# Patient Record
Sex: Female | Born: 1970
Health system: Southern US, Community
[De-identification: ages and names within clinical notes are randomized; demographics above are authoritative.]

## PROBLEM LIST (undated history)

## (undated) DIAGNOSIS — R002 Palpitations: Secondary | ICD-10-CM

## (undated) DIAGNOSIS — E041 Nontoxic single thyroid nodule: Secondary | ICD-10-CM

## (undated) DIAGNOSIS — I10 Essential (primary) hypertension: Secondary | ICD-10-CM

## (undated) DIAGNOSIS — I712 Thoracic aortic aneurysm, without rupture, unspecified: Secondary | ICD-10-CM

## (undated) DIAGNOSIS — Q2381 Bicuspid aortic valve: Secondary | ICD-10-CM

## (undated) DIAGNOSIS — C50919 Malignant neoplasm of unspecified site of unspecified female breast: Secondary | ICD-10-CM

## (undated) DIAGNOSIS — Z9889 Other specified postprocedural states: Secondary | ICD-10-CM

## (undated) DIAGNOSIS — F32A Depression, unspecified: Secondary | ICD-10-CM

## (undated) DIAGNOSIS — E669 Obesity, unspecified: Secondary | ICD-10-CM

## (undated) DIAGNOSIS — D45 Polycythemia vera: Secondary | ICD-10-CM

## (undated) DIAGNOSIS — F329 Major depressive disorder, single episode, unspecified: Secondary | ICD-10-CM

## (undated) DIAGNOSIS — Q231 Congenital insufficiency of aortic valve: Secondary | ICD-10-CM

## (undated) DIAGNOSIS — R112 Nausea with vomiting, unspecified: Secondary | ICD-10-CM

## (undated) DIAGNOSIS — M549 Dorsalgia, unspecified: Secondary | ICD-10-CM

## (undated) DIAGNOSIS — N281 Cyst of kidney, acquired: Secondary | ICD-10-CM

## (undated) DIAGNOSIS — M2550 Pain in unspecified joint: Secondary | ICD-10-CM

## (undated) DIAGNOSIS — F419 Anxiety disorder, unspecified: Secondary | ICD-10-CM

## (undated) HISTORY — DX: Polycythemia vera: D45

## (undated) HISTORY — DX: Cyst of kidney, acquired: N28.1

## (undated) HISTORY — DX: Obesity, unspecified: E66.9

## (undated) HISTORY — PX: MASTECTOMY: SHX3

## (undated) HISTORY — DX: Dorsalgia, unspecified: M54.9

## (undated) HISTORY — DX: Thoracic aortic aneurysm, without rupture: I71.2

## (undated) HISTORY — DX: Bicuspid aortic valve: Q23.81

## (undated) HISTORY — PX: HERNIA REPAIR: SHX51

## (undated) HISTORY — DX: Thoracic aortic aneurysm, without rupture, unspecified: I71.20

## (undated) HISTORY — DX: Pain in unspecified joint: M25.50

## (undated) HISTORY — DX: Anxiety disorder, unspecified: F41.9

## (undated) HISTORY — DX: Depression, unspecified: F32.A

## (undated) HISTORY — DX: Essential (primary) hypertension: I10

## (undated) HISTORY — DX: Palpitations: R00.2

## (undated) HISTORY — PX: CHOLECYSTECTOMY: SHX55

## (undated) HISTORY — PX: TUBAL LIGATION: SHX77

## (undated) HISTORY — DX: Nontoxic single thyroid nodule: E04.1

## (undated) HISTORY — DX: Major depressive disorder, single episode, unspecified: F32.9

## (undated) HISTORY — PX: PARTIAL NEPHRECTOMY: SHX414

## (undated) HISTORY — DX: Congenital insufficiency of aortic valve: Q23.1

## (undated) HISTORY — PX: TONSILLECTOMY: SUR1361

---

## 1999-09-24 ENCOUNTER — Inpatient Hospital Stay (HOSPITAL_COMMUNITY): Admission: AD | Admit: 1999-09-24 | Discharge: 1999-09-24 | Payer: Self-pay | Admitting: Obstetrics and Gynecology

## 1999-12-02 ENCOUNTER — Inpatient Hospital Stay (HOSPITAL_COMMUNITY): Admission: AD | Admit: 1999-12-02 | Discharge: 1999-12-04 | Payer: Self-pay | Admitting: Obstetrics and Gynecology

## 2001-06-06 ENCOUNTER — Other Ambulatory Visit: Admission: RE | Admit: 2001-06-06 | Discharge: 2001-06-06 | Payer: Self-pay | Admitting: *Deleted

## 2001-10-21 ENCOUNTER — Observation Stay (HOSPITAL_COMMUNITY): Admission: AD | Admit: 2001-10-21 | Discharge: 2001-10-22 | Payer: Self-pay | Admitting: Obstetrics and Gynecology

## 2001-10-22 ENCOUNTER — Encounter: Payer: Self-pay | Admitting: Obstetrics and Gynecology

## 2001-12-02 ENCOUNTER — Inpatient Hospital Stay (HOSPITAL_COMMUNITY): Admission: AD | Admit: 2001-12-02 | Discharge: 2001-12-04 | Payer: Self-pay | Admitting: Obstetrics and Gynecology

## 2001-12-02 ENCOUNTER — Encounter (INDEPENDENT_AMBULATORY_CARE_PROVIDER_SITE_OTHER): Payer: Self-pay | Admitting: Specialist

## 2002-01-16 ENCOUNTER — Other Ambulatory Visit: Admission: RE | Admit: 2002-01-16 | Discharge: 2002-01-16 | Payer: Self-pay | Admitting: Obstetrics and Gynecology

## 2004-03-13 ENCOUNTER — Other Ambulatory Visit: Admission: RE | Admit: 2004-03-13 | Discharge: 2004-03-13 | Payer: Self-pay | Admitting: Obstetrics and Gynecology

## 2005-06-09 ENCOUNTER — Other Ambulatory Visit: Admission: RE | Admit: 2005-06-09 | Discharge: 2005-06-09 | Payer: Self-pay | Admitting: Obstetrics and Gynecology

## 2006-07-21 ENCOUNTER — Ambulatory Visit (HOSPITAL_COMMUNITY): Admission: RE | Admit: 2006-07-21 | Discharge: 2006-07-21 | Payer: Self-pay | Admitting: Obstetrics and Gynecology

## 2010-01-17 ENCOUNTER — Ambulatory Visit: Payer: Self-pay | Admitting: Emergency Medicine

## 2010-01-17 DIAGNOSIS — J029 Acute pharyngitis, unspecified: Secondary | ICD-10-CM | POA: Insufficient documentation

## 2010-01-17 DIAGNOSIS — J309 Allergic rhinitis, unspecified: Secondary | ICD-10-CM | POA: Insufficient documentation

## 2010-01-17 LAB — CONVERTED CEMR LAB: Rapid Strep: NEGATIVE

## 2010-07-19 ENCOUNTER — Encounter: Payer: Self-pay | Admitting: Obstetrics and Gynecology

## 2010-07-28 NOTE — Assessment & Plan Note (Signed)
Summary: STREP?/TM   Vital Signs:  Patient Profile:   40 Years Old Female CC:      sore throat congestion x 3  days Height:     65 inches Weight:      183 pounds O2 Sat:      97 % O2 treatment:    Room Air Temp:     97.8 degrees F oral Pulse rate:   71 / minute Resp:     14 per minute BP sitting:   108 / 79  (right arm) Cuff size:   regular  Vitals Entered By: Betti Cruz RN (January 17, 2010 9:49 AM)                  Updated Prior Medication List: No Medications Current Allergies: No known allergies History of Present Illness Chief Complaint: sore throat congestion x 3  days History of Present Illness: Constant sore throat, congestion x3 days.  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  No fever, +cough, no exudates.  Sudafed helps. Not trying any other OTCs.  +sick contact last week.  She is planning an upcoming trip to the beach and doesn't want to be sick. +h/o allergic rhinitis  REVIEW OF SYSTEMS Constitutional Symptoms      Denies fever, chills, night sweats, weight loss, weight gain, and fatigue.  Eyes       Denies change in vision, eye pain, eye discharge, glasses, contact lenses, and eye surgery. Ear/Nose/Throat/Mouth       Complains of sinus problems and sore throat.      Denies hearing loss/aids, change in hearing, ear pain, ear discharge, dizziness, frequent runny nose, frequent nose bleeds, hoarseness, and tooth pain or bleeding.  Respiratory       Denies dry cough, productive cough, wheezing, shortness of breath, asthma, bronchitis, and emphysema/COPD.  Cardiovascular       Denies murmurs, chest pain, and tires easily with exhertion.    Gastrointestinal       Denies stomach pain, nausea/vomiting, diarrhea, constipation, blood in bowel movements, and indigestion. Genitourniary       Denies painful urination, kidney stones, and loss of urinary control. Neurological       Denies paralysis, seizures, and fainting/blackouts. Musculoskeletal       Denies muscle pain,  joint pain, joint stiffness, decreased range of motion, redness, swelling, muscle weakness, and gout.  Skin       Denies bruising, unusual mles/lumps or sores, and hair/skin or nail changes.  Psych       Denies mood changes, temper/anger issues, anxiety/stress, speech problems, depression, and sleep problems.  Past History:  Past Medical History: Unremarkable  Past Surgical History: Lumpectomy Bilateral mastectomies with tram flap reconstruction- 2003  Family History: Family History Lung cancer- mother  Social History: Occupation: Therapist, sports at [employer] Married Never Smoked Alcohol use-yes 1/week Drug use-no Smoking Status:  never Drug Use:  no Physical Exam General appearance: well developed, well nourished, no acute distress Ears: normal, no lesions or deformities Nasal: mucosa pink, nonedematous, no septal deviation, turbinates normal Oral/Pharynx: mild pharyngeal erythema without exudate, +clear PND, uvula midline without deviation Neck: tender ant cerv LAD Chest/Lungs: no rales, wheezes, or rhonchi bilateral, breath sounds equal without effort Heart: regular rate and  rhythm, no murmur Skin: no obvious rashes or lesions MSE: oriented to time, place, and person Assessment New Problems: ACUTE PHARYNGITIS (ICD-462) ALLERGIC RHINITIS (ICD-477.9)  Meets only 1/4 Centor Critieria (<10% chance of strep).  Rapid strep neg.  Patient Education: Patient  and/or caregiver instructed in the following: fluids, Ibuprofen prn.  Plan New Orders: Rapid Strep [97915] New Patient Level II [99202] Planning Comments:   Sudafed twice a day Add Claritin Nasal saline, increase hydration of clear fluids Cough drops and throat spray    The patient and/or caregiver has been counseled thoroughly with regard to medications prescribed including dosage, schedule, interactions, rationale for use, and possible side effects and they verbalize understanding.  Diagnoses and expected course of recovery  discussed and will return if not improved as expected or if the condition worsens. Patient and/or caregiver verbalized understanding.   Orders Added: 1)  Rapid Strep [04136] 2)  New Patient Level II [43837]  Laboratory Results  Date/Time Received: January 17, 2010 10:09 AM  Date/Time Reported: January 17, 2010 10:09 AM   Other Tests  Rapid Strep: negative  Kit Test Internal QC: Negative   (Normal Range: Negative)

## 2010-11-13 NOTE — H&P (Signed)
[facility]  Patient:    Melissa Atkinson, Melissa Atkinson                       MRN: [id number] Adm. Date:  [id number] Attending:  Irven Shelling                         History and Physical  HISTORY OF PRESENT ILLNESS:       Ms. Falconi is a 40 year old married female, gravida 2, para 1, at 38-5/[redacted] weeks gestation.  The patient is admitted for elective induction of labor.  Her pregnancy has gone well.  She has a history of one previous delivery without complications; however, she was induced for pregnancy-induced hypertension.  This pregnancy has gone very well.  OBSTETRICAL LABORATORY DATA:      Maternal blood type O positive, rubella immune, group B Strep negative, glucola normal.  OBSTETRICAL HISTORY:              In 1998, a normal spontaneous vaginal delivery at term, a female weighing 7 pounds 11 ounces, induction secondary to [place].  PAST MEDICAL HISTORY:             1. History of postpartum depression.                                   2. History of polycythemia in the past.                                   3. History of mild depression.                                   4. History of breast lump x 2.  PAST SURGICAL HISTORY:            1. Breast lumpectomy x 2.                                   2. Wrist surgery.                                   3. Knee surgery.  CURRENT MEDICATIONS:              Prenatal vitamins.  ALLERGIES:                        CODEINE.  PHYSICAL EXAMINATION:  VITAL SIGNS:                      Stable, blood pressure 110/64, fetal heart tones reassuring without decelerations.  GENERAL:                          She is a well-developed, well-nourished female in no acute distress.  HEENT:                            Within normal limits.  NECK:  Supple without adenopathy or thyromegaly.  HEART:                            Regular rate and rhythm without murmur, gallop, or rub.  LUNGS:                             Clear to auscultation.  ABDOMEN:                          Gravid and nontender.  BREASTS:                          Exam deferred.  EXTREMITIES:                      Grossly normal.  NEUROLOGIC:                       Grossly normal.  PELVIC:                           Normal external female genitalia, vagina clear.  The cervix is 4 cm dilated, 70% effaced, vertex presentation, -2 station.  Artificial rupture of membranes shows clear fluid.  Fetal heart tones are reactive and in the 140s.  ADMISSION DIAGNOSES:              1. Intrauterine pregnancy at term.                                   2. Induction of labor, elective.                                   3. Mitral valve prolapse.  PLAN:                             1. Expect normal spontaneous vaginal                                      delivery.                                   2. Antibiotic coverage for mitral valve                                      prolapse. DD:  12/02/99 TD:  12/02/99 Job: [id number]

## 2010-11-13 NOTE — Op Note (Signed)
[facility]  Patient:    Melissa Atkinson, Melissa Atkinson Visit Number: [id number] MRN: [id number]          Service Type: Attending:  Dian Queen, M.D. Dictated by:   Dian Queen, M.D. Proc. Date: 12/02/01                             Operative Report  PREOPERATIVE DIAGNOSES:       1. Multiparity.                               2. Desires permanent sterilization.  POSTOPERATIVE DIAGNOSES:      1. Multiparity.                               2. Desires permanent sterilization.  OPERATION:                    Modified [place]oy bilateral tubal ligation.  SURGEON:                      Dian Queen, M.D.  COMPLICATIONS:                None.  DESCRIPTION OF PROCEDURE:     The patient was taken to the operating room after informed consent was obtained.  She was then given more medicines in her already-existing epidural.  The abdomen was prepped and draped in the usual sterile fashion, and a Foley catheter was used as an in-and-out to empty the bladder.  Using the scalpel, a small infraumbilical incision was made and the fascia was then grasped using Allis clamps.  The Allis clamps were lifted up and the fascia was then entered.  The peritoneum was entered using Mayo scissors.  There were no adhesions noted.  Using direct visualization, the right fallopian tube was grasped using a Babcock clamp.  It was followed to the fimbriated end.  The midportion was lifted up and an approximately 3 cm knuckle was then tied off using plain gut suture x2.  That knuckle was excised using Metzenbaum scissors.  The tube was hemostatic and was returned to the abdomen.  In a likewise fashion, on the left side the tube was directly visualized and then grasped using a Babcock clamp.  It was followed to the fimbriated and and then the midportion was lifted up using the Babcock clamp. The 3 cm knuckle was tied off using plain gut suture x2, and that knuckle was excised using Metzenbaum  scissors.  That tube was also hemostatic.  The fascia was closed using 0 Vicryl in continuous running stitch, and the skin was approximated using Dermabond.  At the end of the procedure all sponge, lap, and instrument counts were correct x2.  The patient tolerated the procedure well and went to the recovery room in stable condition. Dictated by:   Dian Queen, M.D. Attending:  Dian Queen, M.D. DD:  12/02/01 TD:  12/05/01 Job: [id number]

## 2011-06-18 ENCOUNTER — Encounter: Payer: Self-pay | Admitting: *Deleted

## 2011-06-18 ENCOUNTER — Emergency Department (INDEPENDENT_AMBULATORY_CARE_PROVIDER_SITE_OTHER)
Admission: EM | Admit: 2011-06-18 | Discharge: 2011-06-18 | Disposition: A | Discharge status: Home / Self Care | Payer: BC Managed Care – PPO | Source: Home / Self Care | Attending: Emergency Medicine | Admitting: Emergency Medicine

## 2011-06-18 DIAGNOSIS — J329 Chronic sinusitis, unspecified: Secondary | ICD-10-CM

## 2011-06-18 HISTORY — DX: Malignant neoplasm of unspecified site of unspecified female breast: C50.919

## 2011-06-18 MED ORDER — PSEUDOEPHEDRINE-GUAIFENESIN ER 60-600 MG PO TB12: ORAL_TABLET | ORAL | Status: AC

## 2011-06-18 MED ORDER — LEVOFLOXACIN 500 MG PO TABS: ORAL_TABLET | ORAL | Status: AC

## 2011-06-18 MED ORDER — FLUTICASONE PROPIONATE 50 MCG/ACT NA SUSP: NASAL | Status: DC

## 2011-06-18 NOTE — ED Provider Notes (Signed)
History     CSN: [id number]  Arrival date & time 06/18/11  1421   First MD Initiated Contact with Patient 06/18/11 1428      Chief Complaint  Patient presents with  . Sinusitis    (Consider location/radiation/quality/duration/timing/severity/associated sxs/prior treatment) HPI Comments: Patient is a nurse at [employer].  Melissa Atkinson is a 40 y.o. female who complains of onset of cold symptoms for 4  days.  They are using OTC med which helps a little bit. complains of sinus pressure both maxillary sinus areas with pain radiating into teeth .  No sore throat Occasional  cough No pleuritic pain No wheezing No nasal congestion + yellow post-nasal drainage + sinus pain/pressure No chest congestion No itchy/red eyes No earache No hemoptysis No SOB No chills/sweats + fever No nausea No vomiting No abdominal pain No diarrhea No skin rashes No fatigue No myalgias No headache   Patient is a 40 y.o. female presenting with sinusitis. The history is provided by the patient.  Sinusitis  Associated symptoms include chills and cough. Pertinent negatives include no shortness of breath.    Past Medical History  Diagnosis Date  . Breast cancer     Past Surgical History  Procedure Date  . Mastectomy     bilateral    Family History  Problem Relation Age of Onset  . Cancer Mother     lung  . COPD Father     History  Substance Use Topics  . Smoking status: Never Smoker   . Smokeless tobacco: Not on file  . Alcohol Use: No    OB History    Grav Para Term Preterm Abortions TAB SAB Ect Mult Living                  Review of Systems  Constitutional: Positive for fever and chills.  Eyes: Negative.   Respiratory: Positive for cough. Negative for shortness of breath and wheezing.   Cardiovascular: Negative.   Gastrointestinal: Negative.   Genitourinary: Negative.   Musculoskeletal: Negative for arthralgias.  Neurological: Negative.   Hematological: Negative.    Psychiatric/Behavioral: Negative.     Allergies  Review of patient's allergies indicates no known allergies.  Home Medications   Current Outpatient Rx  Name Route Sig Dispense Refill  . FLUTICASONE PROPIONATE 50 MCG/ACT NA SUSP  1 or 2 sprays each nostril twice a day 16 g 0  . LEVOFLOXACIN 500 MG PO TABS  Take 1 tablet daily for 10 days. 10 tablet 0  . PSEUDOEPHEDRINE-GUAIFENESIN 60-600 MG PO TB12  Take 1 every 12 hours as needed for congestion. Do not take if you have high blood pressure. Avoid taking late at night. 20 tablet 0    BP 130/89  Pulse 81  Temp(Src) 98 F (36.7 C) (Oral)  Resp 14  Ht 5\' 5"  (1.651 m)  Wt 155 lb (70.308 kg)  BMI 25.79 kg/m2  SpO2 97%  Physical Exam  Nursing note and vitals reviewed. Constitutional: She is oriented to person, place, and time. She appears well-developed and well-nourished. No distress.  HENT:  Head: Normocephalic and atraumatic.  Right Ear: Tympanic membrane, external ear and ear canal normal.  Left Ear: Tympanic membrane, external ear and ear canal normal.  Nose: Mucosal edema and rhinorrhea present. Right sinus exhibits maxillary sinus tenderness. Left sinus exhibits maxillary sinus tenderness.  Mouth/Throat: Oropharynx is clear and moist. No oral lesions. No oropharyngeal exudate.  Eyes: Right eye exhibits no discharge. Left eye exhibits no discharge. No  scleral icterus.  Neck: Neck supple.  Cardiovascular: Normal rate, regular rhythm and normal heart sounds.   Pulmonary/Chest: Effort normal and breath sounds normal. She has no wheezes. She has no rales.  Lymphadenopathy:    She has no cervical adenopathy.  Neurological: She is alert and oriented to person, place, and time.  Skin: Skin is warm and dry.    ED Course  Procedures (including critical care time)  Labs Reviewed - No data to display No results found.   1. Sinusitis       MDM  Take Mucinex D (guaifenesin with decongestant) twice daily for congestion.   Increase fluid intake, rest. May use Afrin nasal spray (or generic oxymetazoline) twice daily for about 5 days.  Also recommend using saline nasal spray several times daily and/or saline nasal irrigation. Stop all antihistamines for now, and other non-prescription cough/cold preparations. Begin Antibiotic. Begin fluticasone nasal spray.  Suggest using the fluticasone after using Afrin and saline irrigation. Follow-up with family doctor if not improving 7 to 10 days.         Lonell Face, MD 06/18/11 [phone]

## 2011-06-18 NOTE — ED Notes (Signed)
Patient c/o intermittent fever, sinus pain/pressure, productive cough and bilateral ear pain.

## 2012-06-14 ENCOUNTER — Encounter: Payer: Self-pay | Admitting: *Deleted

## 2012-06-14 ENCOUNTER — Emergency Department (INDEPENDENT_AMBULATORY_CARE_PROVIDER_SITE_OTHER): Payer: BC Managed Care – PPO

## 2012-06-14 ENCOUNTER — Emergency Department (INDEPENDENT_AMBULATORY_CARE_PROVIDER_SITE_OTHER)
Admission: EM | Admit: 2012-06-14 | Discharge: 2012-06-14 | Disposition: A | Discharge status: Home / Self Care | Payer: BC Managed Care – PPO | Source: Home / Self Care | Attending: Family Medicine | Admitting: Family Medicine

## 2012-06-14 DIAGNOSIS — J069 Acute upper respiratory infection, unspecified: Secondary | ICD-10-CM

## 2012-06-14 DIAGNOSIS — R05 Cough: Secondary | ICD-10-CM

## 2012-06-14 DIAGNOSIS — J309 Allergic rhinitis, unspecified: Secondary | ICD-10-CM

## 2012-06-14 DIAGNOSIS — R059 Cough, unspecified: Secondary | ICD-10-CM

## 2012-06-14 MED ORDER — AZITHROMYCIN 250 MG PO TABS: ORAL_TABLET | ORAL | Status: DC

## 2012-06-14 MED ORDER — HYDROCOD POLST-CHLORPHEN POLST 10-8 MG/5ML PO LQCR: 5.0000 mL | Freq: Two times a day (BID) | ORAL | Status: DC | PRN

## 2012-06-14 MED ORDER — CETIRIZINE HCL 10 MG PO CAPS: 10.0000 mg | ORAL_CAPSULE | Freq: Every day | ORAL | Status: DC

## 2012-06-14 MED ORDER — METHYLPREDNISOLONE ACETATE 80 MG/ML IJ SUSP
80.0000 mg | Freq: Once | INTRAMUSCULAR | Status: AC
  Administered 2012-06-14: 80 mg via INTRAMUSCULAR

## 2012-06-14 NOTE — ED Notes (Signed)
Pt c/o productive cough and chest congestion x 1 wk. Denies fever. She has taken sudafed at 1000 today.

## 2012-06-14 NOTE — ED Provider Notes (Signed)
History     CSN: [id number]  Arrival date & time 06/14/12  1759   First MD Initiated Contact with Patient 06/14/12 1801      Chief Complaint  Patient presents with  . Cough    HPI  URI Symptoms Onset: 1 week Description: cough, chest congestion, wheezing, rhinorrhea x 1 day Modifying factors:  none  Symptoms Nasal discharge: yes; today  Fever: no Sore throat: no Cough: yes Wheezing: yes Ear pain: no GI symptoms: no Sick contacts: yes  Red Flags  Stiff neck: no Dyspnea: no Rash: no Swallowing difficulty: no  Sinusitis Risk Factors Headache/face pain: no Double sickening: no tooth pain: no  Allergy Risk Factors Sneezing: no Itchy scratchy throat: no Seasonal symptoms: yes  Flu Risk Factors Headache: no muscle aches: no severe fatigue: no    Past Medical History  Diagnosis Date  . Breast cancer     Past Surgical History  Procedure Date  . Mastectomy     bilateral  . Tubal ligation   . Partial nephrectomy     RT   . Tonsillectomy     Family History  Problem Relation Age of Onset  . Cancer Mother     lung  . COPD Father     History  Substance Use Topics  . Smoking status: Never Smoker   . Smokeless tobacco: Not on file  . Alcohol Use: No    OB History    Grav Para Term Preterm Abortions TAB SAB Ect Mult Living                  Review of Systems  All other systems reviewed and are negative.    Allergies  Review of patient's allergies indicates no known allergies.  Home Medications   Current Outpatient Rx  Name  Route  Sig  Dispense  Refill  . FLUTICASONE PROPIONATE 50 MCG/ACT NA SUSP      1 or 2 sprays each nostril twice a day   16 g   0   . PSEUDOEPHEDRINE-GUAIFENESIN ER 60-600 MG PO TB12      Take 1 every 12 hours as needed for congestion. Do not take if you have high blood pressure. Avoid taking late at night.   20 tablet   0     BP 141/88  Pulse 91  Temp 98 F (36.7 C) (Oral)  Resp 18  Wt 180 lb  (81.647 kg)  SpO2 100%  Physical Exam  Constitutional: She appears well-developed and well-nourished.  HENT:  Head: Normocephalic and atraumatic.  Right Ear: External ear normal.  Left Ear: External ear normal.       +nasal erythema, rhinorrhea bilaterally, + post oropharyngeal erythema    Eyes: Conjunctivae normal are normal. Pupils are equal, round, and reactive to light.  Neck: Normal range of motion. Neck supple.  Cardiovascular: Normal rate, regular rhythm and normal heart sounds.   Pulmonary/Chest: Effort normal and breath sounds normal.  Abdominal: Soft.  Musculoskeletal: Normal range of motion.  Lymphadenopathy:    She has no cervical adenopathy.  Neurological: She is alert.  Skin: Skin is warm.    ED Course  Procedures (including critical care time)  Labs Reviewed - No data to display No results found.   1. Allergic rhinitis   2. URI (upper respiratory infection)       MDM  I suspect that there is a strong allergic component to sxs.  Start zyrtec. Continue flonase.  zpak for atypical coverage.  Tussionex  for cough.  Discussed supportive care and infectious red flags.  Follow up as needed. Would consider allergy referral if sxs persist for greater than 6-8 weeks.     The patient and/or caregiver has been counseled thoroughly with regard to treatment plan and/or medications prescribed including dosage, schedule, interactions, rationale for use, and possible side effects and they verbalize understanding. Diagnoses and expected course of recovery discussed and will return if not improved as expected or if the condition worsens. Patient and/or caregiver verbalized understanding.              Doree Albee, MD 06/22/12 [phone]

## 2012-11-23 ENCOUNTER — Other Ambulatory Visit: Payer: Self-pay | Admitting: Obstetrics and Gynecology

## 2012-12-19 ENCOUNTER — Encounter (HOSPITAL_COMMUNITY): Payer: Self-pay

## 2012-12-28 ENCOUNTER — Encounter (HOSPITAL_COMMUNITY)
Admission: RE | Disposition: A | Discharge status: Home / Self Care | Payer: Self-pay | Source: Ambulatory Visit | Attending: Obstetrics and Gynecology

## 2012-12-28 ENCOUNTER — Ambulatory Visit (HOSPITAL_COMMUNITY)
Admission: RE | Admit: 2012-12-28 | Discharge: 2012-12-28 | Disposition: A | Discharge status: Home / Self Care | Payer: 59 | Source: Ambulatory Visit | Attending: Obstetrics and Gynecology | Admitting: Obstetrics and Gynecology

## 2012-12-28 ENCOUNTER — Encounter (HOSPITAL_COMMUNITY): Payer: Self-pay | Admitting: Anesthesiology

## 2012-12-28 ENCOUNTER — Ambulatory Visit (HOSPITAL_COMMUNITY): Payer: 59 | Admitting: Anesthesiology

## 2012-12-28 DIAGNOSIS — N920 Excessive and frequent menstruation with regular cycle: Secondary | ICD-10-CM | POA: Insufficient documentation

## 2012-12-28 HISTORY — DX: Nausea with vomiting, unspecified: R11.2

## 2012-12-28 HISTORY — DX: Other specified postprocedural states: Z98.890

## 2012-12-28 HISTORY — PX: DILITATION & CURRETTAGE/HYSTROSCOPY WITH THERMACHOICE ABLATION: SHX5569

## 2012-12-28 LAB — CBC
HCT: 39.2 % (ref 36.0–46.0)
Hemoglobin: 12.8 g/dL (ref 12.0–15.0)
MCH: 27 pg (ref 26.0–34.0)
MCHC: 32.7 g/dL (ref 30.0–36.0)
MCV: 82.7 fL (ref 78.0–100.0)
Platelets: 166 10*3/uL (ref 150–400)
RBC: 4.74 MIL/uL (ref 3.87–5.11)
RDW: 13.4 % (ref 11.5–15.5)
WBC: 8.8 10*3/uL (ref 4.0–10.5)

## 2012-12-28 SURGERY — DILATATION & CURETTAGE/HYSTEROSCOPY WITH THERMACHOICE ABLATION
Anesthesia: General | Site: Vagina | Wound class: Clean Contaminated

## 2012-12-28 MED ORDER — MEPERIDINE HCL 25 MG/ML IJ SOLN: 6.2500 mg | INTRAMUSCULAR | Status: DC | PRN

## 2012-12-28 MED ORDER — OXYCODONE-ACETAMINOPHEN 5-325 MG PO TABS: 1.0000 | ORAL_TABLET | ORAL | Status: DC | PRN

## 2012-12-28 MED ORDER — ONDANSETRON HCL 4 MG/2ML IJ SOLN
INTRAMUSCULAR | Status: AC
  Filled 2012-12-28: qty 2

## 2012-12-28 MED ORDER — LACTATED RINGERS IV SOLN
INTRAVENOUS | Status: DC
  Administered 2012-12-28 (×3): via INTRAVENOUS

## 2012-12-28 MED ORDER — DEXTROSE 5 % IV SOLN
INTRAVENOUS | Status: DC | PRN
  Administered 2012-12-28: 1000 mL

## 2012-12-28 MED ORDER — FENTANYL CITRATE 0.05 MG/ML IJ SOLN
INTRAMUSCULAR | Status: DC | PRN
  Administered 2012-12-28 (×4): 50 ug via INTRAVENOUS

## 2012-12-28 MED ORDER — KETOROLAC TROMETHAMINE 30 MG/ML IJ SOLN
INTRAMUSCULAR | Status: DC | PRN
  Administered 2012-12-28: 30 mg via INTRAVENOUS

## 2012-12-28 MED ORDER — LIDOCAINE HCL (CARDIAC) 20 MG/ML IV SOLN
INTRAVENOUS | Status: DC | PRN
  Administered 2012-12-28: 60 mg via INTRAVENOUS

## 2012-12-28 MED ORDER — PROPOFOL 10 MG/ML IV EMUL
INTRAVENOUS | Status: AC
  Filled 2012-12-28: qty 20

## 2012-12-28 MED ORDER — DEXAMETHASONE SODIUM PHOSPHATE 10 MG/ML IJ SOLN
INTRAMUSCULAR | Status: AC
  Filled 2012-12-28: qty 1

## 2012-12-28 MED ORDER — DEXAMETHASONE SODIUM PHOSPHATE 4 MG/ML IJ SOLN: INTRAMUSCULAR | Status: DC | PRN

## 2012-12-28 MED ORDER — LIDOCAINE HCL 1 % IJ SOLN
INTRAMUSCULAR | Status: DC | PRN
  Administered 2012-12-28: 10 mL

## 2012-12-28 MED ORDER — HYDROMORPHONE HCL PF 1 MG/ML IJ SOLN
0.2500 mg | INTRAMUSCULAR | Status: DC | PRN
  Administered 2012-12-28: 0.5 mg via INTRAVENOUS

## 2012-12-28 MED ORDER — IBUPROFEN 200 MG PO TABS: 600.0000 mg | ORAL_TABLET | Freq: Four times a day (QID) | ORAL | Status: DC | PRN

## 2012-12-28 MED ORDER — DEXAMETHASONE SODIUM PHOSPHATE 10 MG/ML IJ SOLN
INTRAMUSCULAR | Status: DC | PRN
  Administered 2012-12-28: 10 mg via INTRAVENOUS

## 2012-12-28 MED ORDER — ONDANSETRON HCL 4 MG/2ML IJ SOLN
INTRAMUSCULAR | Status: DC | PRN
  Administered 2012-12-28: 4 mg via INTRAVENOUS

## 2012-12-28 MED ORDER — HYDROMORPHONE HCL PF 1 MG/ML IJ SOLN
INTRAMUSCULAR | Status: AC
  Administered 2012-12-28: 0.5 mg via INTRAVENOUS
  Filled 2012-12-28: qty 1

## 2012-12-28 MED ORDER — KETOROLAC TROMETHAMINE 30 MG/ML IJ SOLN: 15.0000 mg | Freq: Once | INTRAMUSCULAR | Status: DC | PRN

## 2012-12-28 MED ORDER — MIDAZOLAM HCL 2 MG/2ML IJ SOLN
INTRAMUSCULAR | Status: AC
  Filled 2012-12-28: qty 2

## 2012-12-28 MED ORDER — CEFAZOLIN SODIUM-DEXTROSE 2-3 GM-% IV SOLR
INTRAVENOUS | Status: AC
  Filled 2012-12-28: qty 50

## 2012-12-28 MED ORDER — PROPOFOL 10 MG/ML IV BOLUS
INTRAVENOUS | Status: DC | PRN
  Administered 2012-12-28: 150 mg via INTRAVENOUS

## 2012-12-28 MED ORDER — LIDOCAINE HCL (CARDIAC) 20 MG/ML IV SOLN
INTRAVENOUS | Status: AC
  Filled 2012-12-28: qty 5

## 2012-12-28 MED ORDER — FENTANYL CITRATE 0.05 MG/ML IJ SOLN
INTRAMUSCULAR | Status: AC
  Filled 2012-12-28: qty 4

## 2012-12-28 MED ORDER — KETOROLAC TROMETHAMINE 30 MG/ML IJ SOLN
INTRAMUSCULAR | Status: AC
  Filled 2012-12-28: qty 1

## 2012-12-28 MED ORDER — OXYCODONE-ACETAMINOPHEN 5-325 MG PO TABS
1.0000 | ORAL_TABLET | ORAL | Status: DC | PRN
  Administered 2012-12-28: 1 via ORAL

## 2012-12-28 MED ORDER — MIDAZOLAM HCL 5 MG/5ML IJ SOLN
INTRAMUSCULAR | Status: DC | PRN
  Administered 2012-12-28: 2 mg via INTRAVENOUS

## 2012-12-28 MED ORDER — ONDANSETRON HCL 4 MG/2ML IJ SOLN: 4.0000 mg | Freq: Once | INTRAMUSCULAR | Status: DC | PRN

## 2012-12-28 MED ORDER — LACTATED RINGERS IV SOLN: INTRAVENOUS | Status: DC

## 2012-12-28 MED ORDER — OXYCODONE-ACETAMINOPHEN 5-325 MG PO TABS
ORAL_TABLET | ORAL | Status: AC
  Administered 2012-12-28: 1 via ORAL
  Filled 2012-12-28: qty 1

## 2012-12-28 MED ORDER — CEFAZOLIN SODIUM-DEXTROSE 2-3 GM-% IV SOLR
2.0000 g | INTRAVENOUS | Status: AC
  Administered 2012-12-28: 2 g via INTRAVENOUS

## 2012-12-28 SURGICAL SUPPLY — 12 items
CANISTER SUCTION 2500CC (MISCELLANEOUS) ×2 IMPLANT
CATH ROBINSON RED A/P 16FR (CATHETERS) ×2 IMPLANT
CATH THERMACHOICE III (CATHETERS) ×3 IMPLANT
CLOTH BEACON ORANGE TIMEOUT ST (SAFETY) ×2 IMPLANT
CONTAINER PREFILL 10% NBF 60ML (FORM) ×4 IMPLANT
DRESSING TELFA 8X3 (GAUZE/BANDAGES/DRESSINGS) ×2 IMPLANT
GLOVE BIO SURGEON STRL SZ 6.5 (GLOVE) ×2 IMPLANT
GOWN STRL REIN XL XLG (GOWN DISPOSABLE) ×4 IMPLANT
PACK HYSTEROSCOPY LF (CUSTOM PROCEDURE TRAY) ×2 IMPLANT
PAD OB MATERNITY 4.3X12.25 (PERSONAL CARE ITEMS) ×2 IMPLANT
TOWEL OR 17X24 6PK STRL BLUE (TOWEL DISPOSABLE) ×4 IMPLANT
WATER STERILE IRR 1000ML POUR (IV SOLUTION) ×2 IMPLANT

## 2012-12-28 NOTE — H&P (Signed)
42 year old female with menorrhagia.  With cramps  NKDA  Afebrile VS  General alert and oriented Lung CTAB Car RRR Abdomen is soft and non tender  IMPRESSION: Menorrhagia  PLAN: D and C  Hysteroscopy Thermachoice endometrial ablation Risks reviewed Consent signed

## 2012-12-28 NOTE — Brief Op Note (Signed)
12/28/2012  8:02 AM  PATIENT:  Melissa Atkinson  42 y.o. female  PRE-OPERATIVE DIAGNOSIS:  menrorhagia  POST-OPERATIVE DIAGNOSIS:  menrorhagia  PROCEDURE:  Procedure(s): DILATATION & CURETTAGE/HYSTEROSCOPY WITH THERMACHOICE ABLATION (N/A)  SURGEON:  Surgeon(s) and Role:    * Cyril Mourning, MD - Primary  PHYSICIAN ASSISTANT:   ASSISTANTS: none   ANESTHESIA:   paracervical block and MAC  EBL:  Total I/O In: 100 [I.V.:100] Out: 35 [Urine:30; Blood:5]  BLOOD ADMINISTERED:none  DRAINS: none   LOCAL MEDICATIONS USED:  XYLOCAINE   SPECIMEN:  Source of Specimen:  uterine currettings  DISPOSITION OF SPECIMEN:  PATHOLOGY  COUNTS:  YES  TOURNIQUET:  * No tourniquets in log *  DICTATION: .Other Dictation: Dictation Number [id number]  PLAN OF CARE: Discharge to home after PACU  PATIENT DISPOSITION:  PACU - hemodynamically stable.   Delay start of Pharmacological VTE agent (>24hrs) due to surgical blood loss or risk of bleeding: not applicable

## 2012-12-28 NOTE — Op Note (Signed)
NAMECASSANDA, WALMER NO.:  [id number]  MEDICAL RECORD NO.:  [id number]  LOCATION:  [facility]                          FACILITY:  [facility]  PHYSICIAN:  Valerian Jewel L. Doyal Saric, M.D.DATE OF BIRTH:  1971/02/21  DATE OF PROCEDURE:  12/28/2012 DATE OF DISCHARGE:                              OPERATIVE REPORT   PREOPERATIVE DIAGNOSIS:  Menorrhagia.  POSTOPERATIVE DIAGNOSIS:  Menorrhagia.  PROCEDURE:  D and C, hysteroscopy, ThermaChoice endometrial ablation.  SURGEON:  Rubbie Goostree L. Helane Rima, MD  ANESTHESIA:  MAC with paracervical block.  COMPLICATIONS:  None.  PATHOLOGY:  Uterine curettings.  PROCEDURE IN DETAIL:  The patient was taken to the operating room after consent was obtained.  She was administered anesthesia and prepped and draped in sterile fashion.  In and out catheter was used to empty the bladder.  Speculum was inserted into the vagina.  The cervix was grasped with a tenaculum.  A paracervical block was performed.  The cervical internal os was gently dilated using Pratt dilators.  The diagnostic hysteroscope was inserted into the uterine cavity.  Uterine cavity was clean.  You could easily visualize both tubal ostia.  The hysteroscope was removed.  The ThermaChoice III balloon was inserted and ThermaChoice endometrial ablation was performed for an 8-minute cycle.  The pressure maintained steady between 150 and 160 mmHg.  The intact balloon was removed at the end of the procedure.  There was no bleeding noted.  All instruments removed from the vagina.  All sponge, lap, and instrument counts were correct x2.  The patient went to recovery room in stable condition.     Chad Tiznado L. Helane Rima, M.D.     Nevin Bloodgood  D:  12/28/2012  T:  12/28/2012  Job:  [id number]

## 2012-12-28 NOTE — Transfer of Care (Signed)
Immediate Anesthesia Transfer of Care Note  Patient: Melissa Atkinson  Procedure(s) Performed: Procedure(s): DILATATION & CURETTAGE/HYSTEROSCOPY WITH THERMACHOICE ABLATION (N/A)  Patient Location: PACU  Anesthesia Type:General  Level of Consciousness: awake, alert , oriented and patient cooperative  Airway & Oxygen Therapy: Patient Spontanous Breathing and Patient connected to nasal cannula oxygen  Post-op Assessment: Report given to PACU RN and Post -op Vital signs reviewed and stable  Post vital signs: Reviewed and stable  Complications: No apparent anesthesia complications

## 2012-12-28 NOTE — Anesthesia Preprocedure Evaluation (Signed)
Anesthesia Evaluation  Patient identified by MRN, date of birth, ID band Patient awake    Reviewed: Allergy & Precautions, H&P , NPO status , Patient's Chart, lab work & pertinent test results  Airway Mallampati: II TM Distance: >3 FB Neck ROM: full    Dental no notable dental hx.    Pulmonary neg pulmonary ROS,    Pulmonary exam normal       Cardiovascular negative cardio ROS      Neuro/Psych negative neurological ROS  negative psych ROS   GI/Hepatic negative GI ROS, Neg liver ROS,   Endo/Other  negative endocrine ROS  Renal/GU negative Renal ROS  negative genitourinary   Musculoskeletal negative musculoskeletal ROS (+)   Abdominal Normal abdominal exam  (+)   Peds negative pediatric ROS (+)  Hematology negative hematology ROS (+)   Anesthesia Other Findings   Reproductive/Obstetrics (+) Pregnancy                           Anesthesia Physical Anesthesia Plan  ASA: II  Anesthesia Plan: General   Post-op Pain Management:    Induction: Intravenous  Airway Management Planned: LMA  Additional Equipment:   Intra-op Plan:   Post-operative Plan:   Informed Consent: I have reviewed the patients History and Physical, chart, labs and discussed the procedure including the risks, benefits and alternatives for the proposed anesthesia with the patient or authorized representative who has indicated his/her understanding and acceptance.     Plan Discussed with: CRNA, Surgeon and Anesthesiologist  Anesthesia Plan Comments:         Anesthesia Quick Evaluation

## 2012-12-28 NOTE — Anesthesia Postprocedure Evaluation (Signed)
  Anesthesia Post-op Note  Anesthesia Post Note  Patient: Melissa Atkinson  Procedure(s) Performed: Procedure(s) (LRB): DILATATION & CURETTAGE/HYSTEROSCOPY WITH THERMACHOICE ABLATION (N/A)  Anesthesia type: General  Patient location: PACU  Post pain: Pain level controlled  Post assessment: Post-op Vital signs reviewed  Last Vitals:  Filed Vitals:   12/28/12 0915  BP: 153/95  Pulse: 71  Temp: 36.4 C  Resp: 16    Post vital signs: Reviewed  Level of consciousness: sedated  Complications: No apparent anesthesia complications

## 2012-12-29 ENCOUNTER — Encounter (HOSPITAL_COMMUNITY): Payer: Self-pay | Admitting: Obstetrics and Gynecology

## 2013-03-07 ENCOUNTER — Ambulatory Visit (INDEPENDENT_AMBULATORY_CARE_PROVIDER_SITE_OTHER): Payer: 59 | Admitting: Physician Assistant

## 2013-03-07 ENCOUNTER — Encounter: Payer: Self-pay | Admitting: Physician Assistant

## 2013-03-07 VITALS — BP 132/81 | HR 72 | Wt 190.0 lb

## 2013-03-07 DIAGNOSIS — Z13220 Encounter for screening for lipoid disorders: Secondary | ICD-10-CM

## 2013-03-07 DIAGNOSIS — R1310 Dysphagia, unspecified: Secondary | ICD-10-CM

## 2013-03-07 DIAGNOSIS — Z6831 Body mass index (BMI) 31.0-31.9, adult: Secondary | ICD-10-CM

## 2013-03-07 DIAGNOSIS — E669 Obesity, unspecified: Secondary | ICD-10-CM

## 2013-03-07 DIAGNOSIS — Z131 Encounter for screening for diabetes mellitus: Secondary | ICD-10-CM

## 2013-03-07 DIAGNOSIS — E041 Nontoxic single thyroid nodule: Secondary | ICD-10-CM

## 2013-03-07 DIAGNOSIS — Q2381 Bicuspid aortic valve: Secondary | ICD-10-CM

## 2013-03-07 DIAGNOSIS — M5430 Sciatica, unspecified side: Secondary | ICD-10-CM

## 2013-03-07 DIAGNOSIS — Z87898 Personal history of other specified conditions: Secondary | ICD-10-CM

## 2013-03-07 DIAGNOSIS — T17308A Unspecified foreign body in larynx causing other injury, initial encounter: Secondary | ICD-10-CM

## 2013-03-07 DIAGNOSIS — C50919 Malignant neoplasm of unspecified site of unspecified female breast: Secondary | ICD-10-CM

## 2013-03-07 DIAGNOSIS — M5432 Sciatica, left side: Secondary | ICD-10-CM

## 2013-03-07 DIAGNOSIS — Q231 Congenital insufficiency of aortic valve: Secondary | ICD-10-CM

## 2013-03-07 MED ORDER — LORCASERIN HCL 10 MG PO TABS: 10.0000 mg | ORAL_TABLET | Freq: Two times a day (BID) | ORAL | Status: DC

## 2013-03-07 NOTE — Progress Notes (Signed)
Subjective:    Patient ID: Melissa Atkinson, female    DOB: 01-19-1971, 42 y.o.   MRN: [id number]  HPI Patient is a 42 yo new patient who presents to the clinic to establish care. Patient does have an past medical history however not on any medications today. Patient has multiple concerns and needs referrals.  Patient has a right nodule on her thyroid. She has been seeing Dr. Warden Fillers for management. Recently in church changed and she needs a new referral to someone in the colon system. She would like to see Dr. Sharl Ma. Nodule has been stable. Surgeon does not feel that the nodule needs to be biopsied or removed at this time. He does recommend followup in one year with ultrasound.  Patient does have history of bicuspid aortic valve and palpitations. She was seen a cardiologist but was outside of network. She would like to see Dr. Jens Som M.D.: Network. She currently is not on any medication and does feel like her symptoms are stable. She has previously been on Toprol but did not feel like helped her symptoms.  Patient does have a bilateral masectomy do to breast cancer. Her OB is following regular breast tissue checks. She does not get traditional mammograms only ultrasounds in the axillary region. Dr. Milton Ferguson at [facility] manages her OB/GYN needs. Last Pap was 7/14.  Patient is also showing with not been able to lose weight. Patient wants 30-60 minutes 3-6 times a week. She is very careful to her calories. She counts them and usually does not get above 1300 calories a day. Her weight continues to increase. She has not historically had a thyroid problem and is checked yearly at endocrinologist. She does during some wine at night but not to excess. She does not eat desserts. She is currently on Weight Watchers with no results.  Patient's new concern today is problems swallowing an episode of choking last week. This is a new problem and has not had any symptoms until the last 5-6 weeks.  Patient has started to notice about 6 weeks ago that bread, pasta, rice or beginning to stick in which she feels like it in her chest. She reports that it feels like it is much below her throat and thyroid region. She's been able to manage it with smaller bites and taking her time she chewing. Last week she took what she reports as 1 teaspoon of rice and noted she was having more difficulty swallowing. She says it did go down into her chest and then she felt like it was stuck. She took a set of diet Coke and started coughing. She then vomited and felt better. There is no blood in vomit. Patient does not excessively drink but does admit to having alcohol 2-3 nights a week with dinner. Patient denies any acid reflux symptoms. She either reports to eat buffalo sauce almost every day for lunch. Patient has never smoked. She has not had another choking episode since. She continues to take really small bites and chew before she swallows.      Review of Systems  All other systems reviewed and are negative.       Objective:   Physical Exam  Constitutional: She is oriented to person, place, and time. She appears well-developed and well-nourished.  Overweight.   HENT:  Head: Normocephalic and atraumatic.  Neck: Normal range of motion. Neck supple. Thyromegaly present.  Right thyroid nodule palpated 1cm on exam.   Cardiovascular: Normal rate, regular rhythm and normal  heart sounds.   Pulmonary/Chest: Effort normal and breath sounds normal. She has no wheezes.  Neurological: She is alert and oriented to person, place, and time.  Skin: Skin is warm and dry.  Psychiatric: She has a normal mood and affect. Her behavior is normal.          Assessment & Plan:  Thyroid nodule-patient was referred to Dr.Kerr for evaluation and management. Since patient has had some recent problems with swallowing I think he does warrant a new ultrasound to make sure thyroid nodule is not increasing. Sounds like problem  is below thyroid.   Bicuspid aortic valve/palpitations-Will send referral to Dr. Jens Som for management.  Breast cancer/bilateral masectomy- yearly axillary ultrasounds and OT management by Dr. Milton Ferguson.  BMI 31/problems with weight loss-continue exercising as patient is doing. Continue watching caloric intake and working the [employer] program. Will send for a nutrition referral. Phentermine is not option for patient to 2 palpitations and history of heart issues. Discussed biopsy with patient. Did start on belviq and gave refills if she liked. I did discuss with her insurance probably would not pay right now for Belviq. She is aware and of nausea as a side effect.Follow up in 3 months.   Trouble swallowing/choking-talking with patient and seems like esophageal stricture could be a real possibility. I think barium swallow could be the next best test. Will send to digestive health for evaluation and testing. In the meantime suggest patient to continue her method of chewing food very well. It does not only she had any problems with acid reflux and will keep away from a PPI at this point. She is also being sent for evaluation with endocrinologist to make sure thyroid is not enlarging and causing any of these symptoms.   Left sciatic- sound like due to HPI. Gave exercises to try with NSAIDs. Needs to come back for formal evaluation.  Screening labs were ordered at todays visit.

## 2013-03-07 NOTE — Patient Instructions (Addendum)

## 2013-03-09 DIAGNOSIS — Z6831 Body mass index (BMI) 31.0-31.9, adult: Secondary | ICD-10-CM | POA: Insufficient documentation

## 2013-03-09 DIAGNOSIS — C50919 Malignant neoplasm of unspecified site of unspecified female breast: Secondary | ICD-10-CM | POA: Insufficient documentation

## 2013-03-09 DIAGNOSIS — Q231 Congenital insufficiency of aortic valve: Secondary | ICD-10-CM | POA: Insufficient documentation

## 2013-03-09 DIAGNOSIS — E041 Nontoxic single thyroid nodule: Secondary | ICD-10-CM | POA: Insufficient documentation

## 2013-03-09 DIAGNOSIS — Z87898 Personal history of other specified conditions: Secondary | ICD-10-CM | POA: Insufficient documentation

## 2013-03-21 ENCOUNTER — Other Ambulatory Visit: Payer: Self-pay | Admitting: Gastroenterology

## 2013-03-21 DIAGNOSIS — R1310 Dysphagia, unspecified: Secondary | ICD-10-CM

## 2013-03-28 ENCOUNTER — Ambulatory Visit
Admission: RE | Admit: 2013-03-28 | Discharge: 2013-03-28 | Disposition: A | Discharge status: Home / Self Care | Payer: 59 | Source: Ambulatory Visit | Attending: Gastroenterology | Admitting: Gastroenterology

## 2013-03-28 DIAGNOSIS — R1310 Dysphagia, unspecified: Secondary | ICD-10-CM

## 2013-04-10 ENCOUNTER — Encounter: Payer: Self-pay | Admitting: Cardiology

## 2013-04-10 ENCOUNTER — Encounter: Payer: Self-pay | Admitting: *Deleted

## 2013-04-11 ENCOUNTER — Encounter: Payer: Self-pay | Admitting: Cardiology

## 2013-04-11 ENCOUNTER — Ambulatory Visit (INDEPENDENT_AMBULATORY_CARE_PROVIDER_SITE_OTHER): Payer: 59 | Admitting: Cardiology

## 2013-04-11 VITALS — BP 124/84 | HR 94 | Ht 65.0 in | Wt 190.0 lb

## 2013-04-11 DIAGNOSIS — Q231 Congenital insufficiency of aortic valve: Secondary | ICD-10-CM

## 2013-04-11 DIAGNOSIS — R002 Palpitations: Secondary | ICD-10-CM

## 2013-04-11 DIAGNOSIS — Z87898 Personal history of other specified conditions: Secondary | ICD-10-CM

## 2013-04-11 DIAGNOSIS — Z8679 Personal history of other diseases of the circulatory system: Secondary | ICD-10-CM

## 2013-04-11 NOTE — Progress Notes (Signed)
     HPI: 42 year old female for evaluation of bicuspid aortic valve. Previously followed at [facility] cardiology. Stress echocardiogram in March of 2013 showed normal LV function, probable bicuspid aortic valve and no stress-induced wall motion abnormalities. Monitor in March of 2013 showed sinus rhythm with PACs and short bursts of SVT but no sustained arrhythmias. Patient has mild dyspnea on exertion which she attributes to her weight. No orthopnea or PND. Occasional mild pedal edema. No exertional chest pain or syncope. Occasional brief palpitations described as a skip and a brief flutter.  Current Outpatient Prescriptions  Medication Sig Dispense Refill  . metoprolol succinate (TOPROL-XL) 25 MG 24 hr tablet Take 25 mg by mouth as needed.       No current facility-administered medications for this visit.    No Known Allergies  Past Medical History  Diagnosis Date  . Breast cancer   . Bicuspid aortic valve   . Thyroid nodule   . Polycythemia vera   . Breast cancer     Past Surgical History  Procedure Laterality Date  . Mastectomy      bilateral  . Tubal ligation    . Partial nephrectomy      RT   . Tonsillectomy    . Dilitation & currettage/hystroscopy with thermachoice ablation N/A 12/28/2012    Procedure: DILATATION & CURETTAGE/HYSTEROSCOPY WITH THERMACHOICE ABLATION;  Surgeon: Cyril Mourning, MD;  Location: [place] [facility] [facility];  Service: Gynecology;  Laterality: N/A;    History   Social History  . Marital Status: Married    Spouse Name: N/A    Number of Children: 3  . Years of Education: N/A   Occupational History  . RN [employer]   Social History Main Topics  . Smoking status: Never Smoker   . Smokeless tobacco: Not on file  . Alcohol Use: Yes     Comment: Occasional  . Drug Use: No  . Sexual Activity: Not on file   Other Topics Concern  . Not on file   Social History Narrative  . No narrative on file    Family History  Problem Relation Age of Onset  .  Cancer Mother     lung  . COPD Father   . Congestive Heart Failure Father   . Stroke Maternal Grandfather     ROS: no fevers or chills, productive cough, hemoptysis, dysphasia, odynophagia, melena, hematochezia, dysuria, hematuria, rash, seizure activity, orthopnea, PND, pedal edema, claudication. Remaining systems are negative.  Physical Exam:   Blood pressure 124/84, pulse 94, height 5' 5"  (1.651 m), weight 190 lb (86.183 kg).  General:  Well developed/well nourished in NAD Skin warm/dry Patient not depressed No peripheral clubbing Back-normal HEENT-normal/normal eyelids Neck supple/normal carotid upstroke bilaterally; no bruits; no JVD; no thyromegaly chest - CTA/ normal expansion CV - RRR/normal S1 and S2; no murmurs, rubs or gallops;  PMI nondisplaced Abdomen -NT/ND, no HSM, no mass, + bowel sounds, no bruit 2+ femoral pulses, no bruits Ext-no edema, chords, 2+ DP Neuro-grossly nonfocal  ECG sinus rhythm with no ST changes. Occasional PACs.

## 2013-04-11 NOTE — Assessment & Plan Note (Signed)
Plan repeat echocardiogram.

## 2013-04-11 NOTE — Assessment & Plan Note (Signed)
Previously documented to be PACs. Patient has been instructed to take Toprol on a daily basis as needed. Decrease caffeine use.

## 2013-04-11 NOTE — Patient Instructions (Signed)
Your physician wants you to follow-up in: [place] will receive a reminder letter in the mail two months in advance. If you don't receive a letter, please call our office to schedule the follow-up appointment.   Your physician has requested that you have an echocardiogram. Echocardiography is a painless test that uses sound waves to create images of your heart. It provides your doctor with information about the size and shape of your heart and how well your heart's chambers and valves are working. This procedure takes approximately one hour. There are no restrictions for this procedure.

## 2013-04-25 ENCOUNTER — Ambulatory Visit (HOSPITAL_COMMUNITY): Payer: 59 | Attending: Cardiology | Admitting: Cardiology

## 2013-04-25 ENCOUNTER — Encounter: Payer: Self-pay | Admitting: Physician Assistant

## 2013-04-25 DIAGNOSIS — I079 Rheumatic tricuspid valve disease, unspecified: Secondary | ICD-10-CM | POA: Insufficient documentation

## 2013-04-25 DIAGNOSIS — Z87898 Personal history of other specified conditions: Secondary | ICD-10-CM

## 2013-04-25 DIAGNOSIS — C50919 Malignant neoplasm of unspecified site of unspecified female breast: Secondary | ICD-10-CM | POA: Insufficient documentation

## 2013-04-25 DIAGNOSIS — Q231 Congenital insufficiency of aortic valve: Secondary | ICD-10-CM | POA: Insufficient documentation

## 2013-04-25 NOTE — Progress Notes (Signed)
Echo performed. 

## 2013-05-03 ENCOUNTER — Other Ambulatory Visit: Payer: Self-pay

## 2013-05-07 LAB — LIPID PANEL
Cholesterol: 196 mg/dL (ref 0–200)
HDL: 86 mg/dL (ref >39)
LDL Cholesterol: 90 mg/dL (ref 0–99)
Total CHOL/HDL Ratio: 2.3 Ratio
Triglycerides: 98 mg/dL (ref <150)
VLDL: 20 mg/dL (ref 0–40)

## 2013-05-07 LAB — COMPLETE METABOLIC PANEL WITH GFR
ALT: 12 U/L (ref 0–35)
AST: 15 U/L (ref 0–37)
Albumin: 4 g/dL (ref 3.5–5.2)
Alkaline Phosphatase: 77 U/L (ref 39–117)
BUN: 11 mg/dL (ref 6–23)
CO2: 27 mEq/L (ref 19–32)
Calcium: 8.9 mg/dL (ref 8.4–10.5)
Chloride: 104 mEq/L (ref 96–112)
Creat: 0.78 mg/dL (ref 0.50–1.10)
GFR, Est African American: >89 mL/min
GFR, Est Non African American: >89 mL/min
Glucose, Bld: 90 mg/dL (ref 70–99)
Potassium: 4.4 mEq/L (ref 3.5–5.3)
Sodium: 139 mEq/L (ref 135–145)
Total Bilirubin: 0.5 mg/dL (ref 0.3–1.2)
Total Protein: 6.7 g/dL (ref 6.0–8.3)

## 2013-06-13 ENCOUNTER — Other Ambulatory Visit: Payer: Self-pay | Admitting: Internal Medicine

## 2013-06-13 ENCOUNTER — Encounter: Payer: Self-pay | Admitting: Physician Assistant

## 2013-06-13 DIAGNOSIS — E041 Nontoxic single thyroid nodule: Secondary | ICD-10-CM

## 2013-06-25 ENCOUNTER — Other Ambulatory Visit: Payer: 59

## 2013-07-03 ENCOUNTER — Ambulatory Visit
Admission: RE | Admit: 2013-07-03 | Discharge: 2013-07-03 | Disposition: A | Discharge status: Home / Self Care | Payer: 59 | Source: Ambulatory Visit | Attending: Internal Medicine | Admitting: Internal Medicine

## 2013-07-03 DIAGNOSIS — E041 Nontoxic single thyroid nodule: Secondary | ICD-10-CM

## 2013-08-17 ENCOUNTER — Encounter: Payer: Self-pay | Admitting: Physician Assistant

## 2013-08-17 ENCOUNTER — Ambulatory Visit (INDEPENDENT_AMBULATORY_CARE_PROVIDER_SITE_OTHER): Payer: 59 | Admitting: Physician Assistant

## 2013-08-17 VITALS — BP 117/78 | HR 91 | Wt 194.0 lb

## 2013-08-17 DIAGNOSIS — R21 Rash and other nonspecific skin eruption: Secondary | ICD-10-CM

## 2013-08-17 DIAGNOSIS — L301 Dyshidrosis [pompholyx]: Secondary | ICD-10-CM

## 2013-08-17 MED ORDER — TRIAMCINOLONE ACETONIDE 0.5 % EX OINT: 1.0000 "application " | TOPICAL_OINTMENT | Freq: Two times a day (BID) | CUTANEOUS | Status: DC

## 2013-08-18 DIAGNOSIS — B081 Molluscum contagiosum: Secondary | ICD-10-CM | POA: Insufficient documentation

## 2013-08-18 NOTE — Progress Notes (Signed)
   Subjective:    Patient ID: Melissa Atkinson, female    DOB: [birth year], 42 y.o.   MRN: [id number]  HPI Pt is a 43 yo female who presents to the clinic with rash on both of her hands for a little over a week. She describes the rash as fluid filled bumps that dry over and are painful and red. Denies any itching. Tried hydrocortisone and has not helped. She works as a Marine scientist in [employer]. She washes her hands a lot and wears gloves. Denies any fever, chills, nausea. Or vomiting.    Review of Systems     Objective:   Physical Exam  Constitutional: She appears well-developed and well-nourished.  Skin:  Erythematic painful vesicles on bilateral fingers of both hands.   Psychiatric: She has a normal mood and affect. Her behavior is normal.          Assessment & Plan:  Rash/dyshidrotic eczema- Reassured pt I did not think was shingles due to both hands being effected. Sent culture of one vesicle to confirm not herpes. Sent triamcinolone to pharmacy. Gave HO on symptomatic care. Call if not improving.

## 2013-08-27 ENCOUNTER — Other Ambulatory Visit: Payer: Self-pay | Admitting: Physician Assistant

## 2013-08-27 DIAGNOSIS — R21 Rash and other nonspecific skin eruption: Secondary | ICD-10-CM

## 2013-08-27 DIAGNOSIS — L301 Dyshidrosis [pompholyx]: Secondary | ICD-10-CM

## 2013-08-27 LAB — VIRAL CULTURE VIRC: Organism ID, Bacteria: NEGATIVE

## 2013-08-27 MED ORDER — CLOBETASOL PROPIONATE 0.05 % EX CREA: 1.0000 "application " | TOPICAL_CREAM | Freq: Two times a day (BID) | CUTANEOUS | Status: DC

## 2013-10-03 ENCOUNTER — Other Ambulatory Visit: Payer: Self-pay | Admitting: Dermatology

## 2013-10-29 ENCOUNTER — Encounter: Payer: Self-pay | Admitting: Sports Medicine

## 2013-10-29 ENCOUNTER — Ambulatory Visit (INDEPENDENT_AMBULATORY_CARE_PROVIDER_SITE_OTHER): Payer: 59

## 2013-10-29 ENCOUNTER — Ambulatory Visit (INDEPENDENT_AMBULATORY_CARE_PROVIDER_SITE_OTHER): Payer: 59 | Admitting: Sports Medicine

## 2013-10-29 VITALS — BP 132/87 | HR 72 | Ht 65.0 in | Wt 174.0 lb

## 2013-10-29 DIAGNOSIS — M5432 Sciatica, left side: Secondary | ICD-10-CM

## 2013-10-29 DIAGNOSIS — M5430 Sciatica, unspecified side: Secondary | ICD-10-CM

## 2013-10-29 DIAGNOSIS — M545 Low back pain, unspecified: Secondary | ICD-10-CM

## 2013-10-29 MED ORDER — IBUPROFEN 800 MG PO TABS: 800.0000 mg | ORAL_TABLET | Freq: Three times a day (TID) | ORAL | Status: DC | PRN

## 2013-10-29 MED ORDER — PREDNISONE 50 MG PO TABS: ORAL_TABLET | ORAL | Status: DC

## 2013-10-29 NOTE — Progress Notes (Signed)
Patient ID: Melissa Atkinson, female   DOB: 02-Feb-1971, 43 y.o.   MRN: [id number]   Subjective:    I'm seeing this patient as a consultation for:  Iran Planas, PA-C  CC: Left leg pain  HPI:  Pt is a 43 y/o woman presenting today with the complaint of left posterior thigh pain and lower leg numbness.  Symptoms have been present for almost a year and have been gradually increasing in severity and duration.  She now has symptoms daily, especially worse at the end of a work day.  She is a PACU nurse and stands all day.  The pain is present from the left buttock down the back of the left thigh to the knee.  It is a sharp and tingly pain that she describes as a "nerve" type pain.  She also experiences left leg numbness below the knee.  The numbness is the entire leg and entire foot.  The pain and numbness are worse after long periods of standing or sitting.  They are not worsened by valsalva maneuver.  Walking does not cause pain.  She has tried conservative therapy on her own at home for what she thinks is "sciatica pain" which include stretches, daily motrin, and orthotics.  None have improved symptoms.  She denies any trauma or inciting events.    Past medical history, Surgical history, Family history not pertinant except as noted below, Social history, Allergies, and medications have been entered into the medical record, reviewed, and no changes needed.   Review of Systems: No headache, visual changes, nausea, vomiting, diarrhea, constipation, dizziness, abdominal pain, skin rash, fevers, chills, night sweats, weight loss, swollen lymph nodes, body aches, joint swelling, muscle aches, chest pain, shortness of breath, mood changes, visual or auditory hallucinations.   Objective:   General: Well Developed, well nourished, and in no acute distress.  Neuro/Psych: Alert and oriented x3, extra-ocular muscles intact, able to move all 4 extremities, sensation grossly intact. Skin: Warm and dry, no rashes  noted.  Respiratory: Not using accessory muscles, speaking in full sentences, trachea midline.  Cardiovascular: Pulses palpable, no extremity edema. Abdomen: Does not appear distended. Back Exam:  Inspection: Unremarkable  Motion: Flexion 45 deg, Extension 45 deg, Side Bending to 45 deg bilaterally,  Rotation to 45 deg bilaterally  SLR laying: Negative  XSLR laying: Negative  Palpable tenderness: None. FABER: negative. Sensory change: Gross sensation intact to all lumbar and sacral dermatomes.  Reflexes: 2+ at both patellar tendons, 2+ at achilles tendons, Babinski's downgoing.  Strength at foot  Plantar-flexion: 5/5 Dorsi-flexion: 5/5 Eversion: 5/5 Inversion: 5/5  Leg strength  Quad: 5/5 Hamstring: 5/5 Hip flexor: 5/5 Hip abductors: 5/5  Gait unremarkable. Left Hip: ROM IR: 45 Deg, ER: 45 Deg, Flexion: 120 Deg, Extension: 100 Deg, Abduction: 45 Deg, Adduction: 45 Deg Strength IR: 5/5, ER: 5/5, Flexion: 5/5, Extension: 4/5, Abduction: 4-/5, Adduction: 5/5 Pelvic alignment unremarkable to inspection and palpation. Standing hip rotation and gait without trendelenburg sign / unsteadiness. Greater trochanter without tenderness to palpation. No tenderness over piriformis. No pain with FABER or FADIR. No SI joint tenderness and normal minimal SI movement.  Lumbar spine x-rays reviewed and are normal.  Impression and Recommendations:   This case required medical decision making of moderate complexity.  This patient presents with almost a year of ongoing neurogenic pain of uncertain etiology.  The distribution of symptoms are not dermatomal in nature which would point more towards sciatic pain. Weakness on abduction of the left hip points  to a possible piriformis syndrome.  At this point piriformis syndrome is the primary differential diagnosis with lumbar radiculopathy not yet able to be ruled out.  Will obtain imaging to further elucidate any possible discogenic sources of pain.   Otherwise we will treat conservatively with formal PT for piriformis and lumbar etiologies.  Will also give 5 day course of prednisone for potential lumbar radiculopathy.   - Prednisone burst - lumbar x-rays today - Formal PT  - f/u   -

## 2013-10-29 NOTE — Assessment & Plan Note (Signed)
Melissa Atkinson does not have the classic lumbar radicular type symptoms. I do think this is predominately sciatic, especially considering how weak her hip abductor's are all on the left side. Formal physical therapy for the lumbar spine but also aggressive hip abductor rehabilitation. Prednisone, ibuprofen 800, x-rays. Return for custom orthotics. Then come to see me in one month.

## 2013-10-30 ENCOUNTER — Ambulatory Visit: Payer: 59 | Admitting: Sports Medicine

## 2013-11-07 ENCOUNTER — Encounter: Payer: 59 | Admitting: Sports Medicine

## 2013-11-07 ENCOUNTER — Ambulatory Visit (INDEPENDENT_AMBULATORY_CARE_PROVIDER_SITE_OTHER): Payer: 59

## 2013-11-07 DIAGNOSIS — M5430 Sciatica, unspecified side: Secondary | ICD-10-CM

## 2013-11-07 DIAGNOSIS — M25659 Stiffness of unspecified hip, not elsewhere classified: Secondary | ICD-10-CM

## 2013-11-07 DIAGNOSIS — M25559 Pain in unspecified hip: Secondary | ICD-10-CM

## 2013-11-07 DIAGNOSIS — M6281 Muscle weakness (generalized): Secondary | ICD-10-CM

## 2013-11-08 ENCOUNTER — Ambulatory Visit (INDEPENDENT_AMBULATORY_CARE_PROVIDER_SITE_OTHER): Payer: 59 | Admitting: Sports Medicine

## 2013-11-08 ENCOUNTER — Encounter: Payer: Self-pay | Admitting: Sports Medicine

## 2013-11-08 VITALS — BP 132/90 | HR 64 | Ht 65.0 in | Wt 174.0 lb

## 2013-11-08 DIAGNOSIS — M5430 Sciatica, unspecified side: Secondary | ICD-10-CM

## 2013-11-08 DIAGNOSIS — M5432 Sciatica, left side: Secondary | ICD-10-CM

## 2013-11-08 NOTE — Assessment & Plan Note (Signed)
Symptoms are not classic radicular. We do some predominately sciatic. She has started physical therapy, and after her current medication symptoms are almost resolved. Custom orthotics as above. Return to see me in one month.

## 2013-11-08 NOTE — Progress Notes (Signed)
    Patient was fitted for a : standard, cushioned, semi-rigid orthotic. The orthotic was heated and afterward the patient stood on the orthotic blank positioned on the orthotic stand. The patient was positioned in subtalar neutral position and 10 degrees of ankle dorsiflexion in a weight bearing stance. After completion of molding, a stable base was applied to the orthotic blank. The blank was ground to a stable position for weight bearing. Size: 8 Base: Blue EVA Additional Posting and Padding: None The patient ambulated these, and they were very comfortable.  I spent 40 minutes with this patient, greater than 50% was face-to-face time counseling regarding the below diagnosis.   

## 2013-11-14 ENCOUNTER — Encounter (INDEPENDENT_AMBULATORY_CARE_PROVIDER_SITE_OTHER): Payer: 59

## 2013-11-14 DIAGNOSIS — M25659 Stiffness of unspecified hip, not elsewhere classified: Secondary | ICD-10-CM

## 2013-11-14 DIAGNOSIS — M6281 Muscle weakness (generalized): Secondary | ICD-10-CM

## 2013-11-14 DIAGNOSIS — M25559 Pain in unspecified hip: Secondary | ICD-10-CM

## 2013-11-14 DIAGNOSIS — M5430 Sciatica, unspecified side: Secondary | ICD-10-CM

## 2013-11-15 ENCOUNTER — Encounter (INDEPENDENT_AMBULATORY_CARE_PROVIDER_SITE_OTHER): Payer: 59 | Admitting: Physical Therapy

## 2013-11-15 DIAGNOSIS — M25559 Pain in unspecified hip: Secondary | ICD-10-CM

## 2013-11-15 DIAGNOSIS — M5430 Sciatica, unspecified side: Secondary | ICD-10-CM

## 2013-11-15 DIAGNOSIS — M6281 Muscle weakness (generalized): Secondary | ICD-10-CM

## 2013-11-15 DIAGNOSIS — M25659 Stiffness of unspecified hip, not elsewhere classified: Secondary | ICD-10-CM

## 2013-11-21 ENCOUNTER — Encounter: Payer: 59 | Admitting: Physical Therapy

## 2013-11-22 ENCOUNTER — Encounter (INDEPENDENT_AMBULATORY_CARE_PROVIDER_SITE_OTHER): Payer: 59 | Admitting: Physical Therapy

## 2013-11-22 DIAGNOSIS — M25559 Pain in unspecified hip: Secondary | ICD-10-CM

## 2013-11-22 DIAGNOSIS — M5430 Sciatica, unspecified side: Secondary | ICD-10-CM

## 2013-11-22 DIAGNOSIS — M25659 Stiffness of unspecified hip, not elsewhere classified: Secondary | ICD-10-CM

## 2013-11-22 DIAGNOSIS — M6281 Muscle weakness (generalized): Secondary | ICD-10-CM

## 2014-01-29 ENCOUNTER — Ambulatory Visit: Payer: 59 | Admitting: Sports Medicine

## 2014-02-05 ENCOUNTER — Ambulatory Visit (INDEPENDENT_AMBULATORY_CARE_PROVIDER_SITE_OTHER): Payer: 59 | Admitting: Sports Medicine

## 2014-02-05 VITALS — BP 122/81 | HR 68 | Ht 65.0 in | Wt 159.0 lb

## 2014-02-05 DIAGNOSIS — M5432 Sciatica, left side: Secondary | ICD-10-CM

## 2014-02-05 DIAGNOSIS — M5430 Sciatica, unspecified side: Secondary | ICD-10-CM

## 2014-02-05 NOTE — Assessment & Plan Note (Signed)
Symptoms have completely resolved with physical therapy focusing on hip abductor's. Hip abductor's are exquisitely strong now, return as needed.

## 2014-02-05 NOTE — Progress Notes (Signed)
  Subjective:    CC: Followup  HPI: This is a very pleasant 43 year old female, I saw her approximately 2 months ago with left-sided sciatica, she had very weak hip abductor's at the time and we treated her relatively conservatively with steroids and formal physical therapy, she returns today with symptoms completely resolved. Happy with results.  Past medical history, Surgical history, Family history not pertinant except as noted below, Social history, Allergies, and medications have been entered into the medical record, reviewed, and no changes needed.   Review of Systems: No fevers, chills, night sweats, weight loss, chest pain, or shortness of breath.   Objective:    General: Well Developed, well nourished, and in no acute distress.  Neuro: Alert and oriented x3, extra-ocular muscles intact, sensation grossly intact.  HEENT: Normocephalic, atraumatic, pupils equal round reactive to light, neck supple, no masses, no lymphadenopathy, thyroid nonpalpable.  Skin: Warm and dry, no rashes. Cardiac: Regular rate and rhythm, no murmurs rubs or gallops, no lower extremity edema.  Respiratory: Clear to auscultation bilaterally. Not using accessory muscles, speaking in full sentences. Bilateral Hip: ROM IR: 60 Deg, ER: 60 Deg, Flexion: 120 Deg, Extension: 100 Deg, Abduction: 45 Deg, Adduction: 45 Deg Strength IR: 5/5, ER: 5/5, Flexion: 5/5, Extension: 5/5, Abduction: 5/5, Adduction: 5/5 Pelvic alignment unremarkable to inspection and palpation. Standing hip rotation and gait without trendelenburg / unsteadiness. Greater trochanter without tenderness to palpation. No tenderness over piriformis. No SI joint tenderness and normal minimal SI movement.  Excellent hip abductor strength bilaterally.  Impression and Recommendations:

## 2014-03-27 ENCOUNTER — Telehealth: Payer: Self-pay | Admitting: Emergency Medicine

## 2014-03-27 NOTE — Telephone Encounter (Signed)
Patient has been to her dermatologist to have molluscum on hand treated; they gave her topical rx last week which has not only not worked, but she is noticing more sites. Requests appt. with Dr.T for consult and to have them frozen.  Appointment given with Dr.T for 04/01/14 at 10:45a.m. PKlaers, RN

## 2014-04-01 ENCOUNTER — Encounter: Payer: Self-pay | Admitting: Sports Medicine

## 2014-04-01 ENCOUNTER — Ambulatory Visit (INDEPENDENT_AMBULATORY_CARE_PROVIDER_SITE_OTHER): Payer: 59 | Admitting: Sports Medicine

## 2014-04-01 VITALS — BP 119/79 | HR 64 | Ht 65.0 in | Wt 140.0 lb

## 2014-04-01 DIAGNOSIS — Z23 Encounter for immunization: Secondary | ICD-10-CM

## 2014-04-01 DIAGNOSIS — B081 Molluscum contagiosum: Secondary | ICD-10-CM

## 2014-04-01 MED ORDER — IMIQUIMOD 5 % EX CREA: TOPICAL_CREAM | CUTANEOUS | Status: DC

## 2014-04-01 MED ORDER — VALACYCLOVIR HCL 1 G PO TABS: 1000.0000 mg | ORAL_TABLET | Freq: Two times a day (BID) | ORAL | Status: DC

## 2014-04-01 NOTE — Patient Instructions (Signed)
Molluscum Contagiosum Molluscum contagiosum is a viral infection of the skin that causes smooth surfaced, firm, small (3 to 5 mm), dome-shaped bumps (papules) which are flesh-colored. The bumps usually do not hurt or itch. In children, they most often appear on the face, trunk, arms and legs. In adults, the growths are commonly found on the genitals, thighs, face, neck, and belly (abdomen). The infection may be spread to others by close (skin to skin) contact (such as occurs in schools and swimming pools), sharing towels and clothing, and through sexual contact. The bumps usually disappear without treatment in 2 to 4 months, especially in children. You may have them treated to avoid spreading them. Scraping (curetting) the middle part (central plug) of the bump with a needle or sharp curette, or application of liquid nitrogen for 8 or 9 seconds usually cures the infection. HOME CARE INSTRUCTIONS   Do not scratch the bumps. This may spread the infection to other parts of the body and to other people.  Avoid close contact with others, including sexual contact, until the bumps disappear. Do not share towels or clothing.  If liquid nitrogen was used, blisters will form. Leave the blisters alone and cover with a bandage. The tops will fall off by themselves in 7 to 14 days.  Four months without a lesion is usually a cure. SEEK IMMEDIATE MEDICAL CARE IF:  You have a fever.  You develop swelling, redness, pain, tenderness, or warmth in the areas of the bumps. They may be infected. Document Released: 06/11/2000 Document Revised: 09/06/2011 Document Reviewed: 11/22/2008 ExitCare Patient Information 2015 [employer]. This information is not intended to replace advice given to you by your health care provider. Make sure you discuss any questions you have with your health care provider.  

## 2014-04-01 NOTE — Assessment & Plan Note (Signed)
Cryotherapy of 25 lesions. They do have the appearance of herpetic whitlow so we are going to add Valtrex. I also like to add Aldara cream. Return in 2 weeks. She is going to take a picture of her hands today so that we can compare after 2 weeks.

## 2014-04-01 NOTE — Progress Notes (Signed)
  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 She has been using multiple treatments, but has not yet had cryotherapy. She has also never used Aldara or Valtrex. Symptoms are severe, persistent and she desires cryotherapy today.  Past medical history, Surgical history, Family history not pertinant except as noted below, Social history, Allergies, and medications have been entered into the medical record, reviewed, and no changes needed.   Review of Systems: No fevers, chills, night sweats, weight loss, chest pain, or shortness of breath.   Objective:    General: Well Developed, well nourished, and in no acute distress.  Neuro: Alert and oriented x3, extra-ocular muscles intact, sensation grossly intact.  HEENT: Normocephalic, atraumatic, pupils equal round reactive to light, neck supple, no masses, no lymphadenopathy, thyroid nonpalpable.  Skin: Warm and dry, there are multiple papules, approximately 25 over both fingers and the web spaces. Some were umbilicated, some are not. Cardiac: Regular rate and rhythm, no murmurs rubs or gallops, no lower extremity edema.  Respiratory: Clear to auscultation bilaterally. Not using accessory muscles, speaking in full sentences.  Procedure:  Cryodestruction of 25 benign papules on both hands suggestive of multiple contagiosum versus herpetic whitlow. Consent obtained and verified. Time-out conducted. Noted no overlying erythema, induration, or other signs of local infection. Completed without difficulty using Cryo-Gun. Advised to call if fevers/chills, erythema, induration, drainage, or persistent bleeding.  Impression and Recommendations:

## 2014-04-02 ENCOUNTER — Encounter: Payer: Self-pay | Admitting: Sports Medicine

## 2014-04-15 ENCOUNTER — Ambulatory Visit (INDEPENDENT_AMBULATORY_CARE_PROVIDER_SITE_OTHER): Payer: 59 | Admitting: Sports Medicine

## 2014-04-15 ENCOUNTER — Encounter: Payer: Self-pay | Admitting: Sports Medicine

## 2014-04-15 VITALS — BP 108/77 | HR 76 | Ht 65.0 in | Wt 149.0 lb

## 2014-04-15 DIAGNOSIS — B081 Molluscum contagiosum: Secondary | ICD-10-CM

## 2014-04-15 MED ORDER — VALACYCLOVIR HCL 1 G PO TABS: 1000.0000 mg | ORAL_TABLET | Freq: Every day | ORAL | Status: DC

## 2014-04-15 NOTE — Assessment & Plan Note (Signed)
FMLA paperwork filled out. Cryotherapy on 8 additional lesions. Did extremely well with Valtrex, after stopping, new lesions arose, refilling Valtrex.

## 2014-04-15 NOTE — Progress Notes (Signed)
  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 We did cryotherapy on many of them at the last visit, they are improving significantly/resolved. We also started Valtrex as they had the appearance of herpetic whitlow. She improved significantly, but unfortunately some of her new lesions started popping up after stopping the Valtrex. She does have some FMLA paperwork that she needs filled out today. Aldara did not seem to help. She did have a biopsy with the dermatologist that showed molluscum contagiosum.  Past medical history, Surgical history, Family history not pertinant except as noted below, Social history, Allergies, and medications have been entered into the medical record, reviewed, and no changes needed.   Review of Systems: No fevers, chills, night sweats, weight loss, chest pain, or shortness of breath.   Objective:    General: Well Developed, well nourished, and in no acute distress.  Neuro: Alert and oriented x3, extra-ocular muscles intact, sensation grossly intact.  HEENT: Normocephalic, atraumatic, pupils equal round reactive to light, neck supple, no masses, no lymphadenopathy, thyroid nonpalpable.  Skin: Warm and dry, no rashes. Cardiac: Regular rate and rhythm, no murmurs rubs or gallops, no lower extremity edema.  Respiratory: Clear to auscultation bilaterally. Not using accessory muscles, speaking in full sentences. Hands: Several papular lesions, some umbilicated, some grouped and vesicular.  Procedure:  Cryodestruction of 8 lesions on the hands. Consent obtained and verified. Time-out conducted. Noted no overlying erythema, induration, or other signs of local infection. Completed without difficulty using Cryo-Gun. Advised to call if fevers/chills, erythema, induration, drainage, or persistent bleeding.  Impression and  Recommendations:

## 2014-04-17 ENCOUNTER — Ambulatory Visit: Payer: 59 | Admitting: Sports Medicine

## 2014-04-17 ENCOUNTER — Ambulatory Visit: Payer: Self-pay | Admitting: Sports Medicine

## 2014-04-29 ENCOUNTER — Encounter: Payer: Self-pay | Admitting: Sports Medicine

## 2014-04-29 ENCOUNTER — Ambulatory Visit (INDEPENDENT_AMBULATORY_CARE_PROVIDER_SITE_OTHER): Payer: 59 | Admitting: Sports Medicine

## 2014-04-29 VITALS — BP 126/84 | HR 79 | Wt 151.0 lb

## 2014-04-29 DIAGNOSIS — B081 Molluscum contagiosum: Secondary | ICD-10-CM

## 2014-04-29 DIAGNOSIS — J0100 Acute maxillary sinusitis, unspecified: Secondary | ICD-10-CM | POA: Insufficient documentation

## 2014-04-29 MED ORDER — AZITHROMYCIN 250 MG PO TABS: ORAL_TABLET | ORAL | Status: DC

## 2014-04-29 MED ORDER — FLUTICASONE PROPIONATE 50 MCG/ACT NA SUSP: NASAL | Status: DC

## 2014-04-29 NOTE — Assessment & Plan Note (Signed)
After several sessions of cryotherapy and oral Valtrex symptoms have resolved. Continue Valtrex for an additional month before discontinuation, this makes me think these lesions represented herpetic whitlow rather than molluscum contagiosum. Back to work on Monday.

## 2014-04-29 NOTE — Progress Notes (Signed)
  Subjective:    CC: follow-up  HPI: Hand lesions: Herpetic whitlow as well as molluscum contagiosum, improving significantly with Valtrex, and post-cryotherapy.eager to go back to work next Monday once everything is healed.  Sinus infection: For the past several days has had eye lateral maxillary sinus pressure, moderate, persistent without radiation, mild cough.  Past medical history, Surgical history, Family history not pertinant except as noted below, Social history, Allergies, and medications have been entered into the medical record, reviewed, and no changes needed.   Review of Systems: No fevers, chills, night sweats, weight loss, chest pain, or shortness of breath.   Objective:    General: Well Developed, well nourished, and in no acute distress.  Neuro: Alert and oriented x3, extra-ocular muscles intact, sensation grossly intact.  HEENT: Normocephalic, atraumatic, pupils equal round reactive to light, neck supple, no masses, no lymphadenopathy, thyroid nonpalpable. Oropharynx, nasopharynx, external ear canals are unremarkable. Skin: Warm and dry, no rashes.no new lesions, there are a few old crusting lesions on both hands. No sign of bacterial superinfection. Cardiac: Regular rate and rhythm, no murmurs rubs or gallops, no lower extremity edema.  Respiratory: Clear to auscultation bilaterally. Not using accessory muscles, speaking in full sentences.  Impression and Recommendations:

## 2014-04-29 NOTE — Assessment & Plan Note (Signed)
Flonase, azithromycin.

## 2014-07-01 ENCOUNTER — Other Ambulatory Visit: Payer: Self-pay | Admitting: Internal Medicine

## 2014-07-01 DIAGNOSIS — E049 Nontoxic goiter, unspecified: Secondary | ICD-10-CM

## 2014-07-05 ENCOUNTER — Other Ambulatory Visit: Payer: 59

## 2014-07-12 ENCOUNTER — Encounter: Payer: Self-pay | Admitting: Sports Medicine

## 2014-07-12 ENCOUNTER — Other Ambulatory Visit: Payer: 59

## 2014-07-12 ENCOUNTER — Encounter: Payer: 59 | Admitting: Sports Medicine

## 2014-07-12 ENCOUNTER — Ambulatory Visit (INDEPENDENT_AMBULATORY_CARE_PROVIDER_SITE_OTHER): Payer: 59 | Admitting: Sports Medicine

## 2014-07-12 VITALS — BP 112/84 | HR 72 | Ht 65.0 in | Wt 167.0 lb

## 2014-07-12 DIAGNOSIS — E041 Nontoxic single thyroid nodule: Secondary | ICD-10-CM

## 2014-07-12 DIAGNOSIS — Q231 Congenital insufficiency of aortic valve: Secondary | ICD-10-CM

## 2014-07-12 DIAGNOSIS — Z0000 Encounter for general adult medical examination without abnormal findings: Secondary | ICD-10-CM

## 2014-07-12 DIAGNOSIS — Q2381 Bicuspid aortic valve: Secondary | ICD-10-CM

## 2014-07-12 DIAGNOSIS — Z9889 Other specified postprocedural states: Secondary | ICD-10-CM

## 2014-07-12 DIAGNOSIS — B081 Molluscum contagiosum: Secondary | ICD-10-CM

## 2014-07-12 DIAGNOSIS — Z9013 Acquired absence of bilateral breasts and nipples: Secondary | ICD-10-CM | POA: Insufficient documentation

## 2014-07-12 LAB — COMPREHENSIVE METABOLIC PANEL
ALT: 12 U/L (ref 0–35)
AST: 14 U/L (ref 0–37)
Albumin: 3.9 g/dL (ref 3.5–5.2)
Alkaline Phosphatase: 56 U/L (ref 39–117)
BUN: 14 mg/dL (ref 6–23)
CO2: 28 mEq/L (ref 19–32)
Calcium: 9.2 mg/dL (ref 8.4–10.5)
Chloride: 104 mEq/L (ref 96–112)
Creat: 0.71 mg/dL (ref 0.50–1.10)
Glucose, Bld: 84 mg/dL (ref 70–99)
Potassium: 4.1 mEq/L (ref 3.5–5.3)
Sodium: 141 mEq/L (ref 135–145)
Total Bilirubin: 0.7 mg/dL (ref 0.2–1.2)
Total Protein: 6.8 g/dL (ref 6.0–8.3)

## 2014-07-12 LAB — CBC
HCT: 42.2 % (ref 36.0–46.0)
Hemoglobin: 13.9 g/dL (ref 12.0–15.0)
MCH: 30.1 pg (ref 26.0–34.0)
MCHC: 32.9 g/dL (ref 30.0–36.0)
MCV: 91.3 fL (ref 78.0–100.0)
MPV: 9.5 fL (ref 8.6–12.4)
Platelets: 191 10*3/uL (ref 150–400)
RBC: 4.62 MIL/uL (ref 3.87–5.11)
RDW: 13.7 % (ref 11.5–15.5)
WBC: 7.1 10*3/uL (ref 4.0–10.5)

## 2014-07-12 LAB — LIPID PANEL
Cholesterol: 213 mg/dL — ABNORMAL HIGH (ref 0–200)
HDL: 108 mg/dL (ref >39)
LDL Cholesterol: 92 mg/dL (ref 0–99)
Total CHOL/HDL Ratio: 2 Ratio
Triglycerides: 64 mg/dL (ref <150)
VLDL: 13 mg/dL (ref 0–40)

## 2014-07-12 LAB — T3, FREE: T3, Free: 2.4 pg/mL (ref 2.3–4.2)

## 2014-07-12 LAB — HEMOGLOBIN A1C
Hgb A1c MFr Bld: 5.2 % (ref <5.7)
Mean Plasma Glucose: 103 mg/dL (ref <117)

## 2014-07-12 LAB — T4, FREE: Free T4: 0.99 ng/dL (ref 0.80–1.80)

## 2014-07-12 LAB — TSH: TSH: 1.013 u[IU]/mL (ref 0.350–4.500)

## 2014-07-12 NOTE — Assessment & Plan Note (Signed)
We'll be following up with her endocrinologist for surveillance of her thyroid nodules and goiter. I'm going to go ahead and order her TFTs.

## 2014-07-12 NOTE — Assessment & Plan Note (Signed)
Annual physical exam as above. Routine blood work ordered. Return in one year.

## 2014-07-12 NOTE — Progress Notes (Signed)
  Subjective:    CC: complete physical exam  HPI:  Bicuspid aortic valve: Currently followed by cardiology, she did have a negative stress echo in 2014, has not had a neck own over one year. Asymptomatic.  Thyroid nodule: Currently followed by endocrine, she has an ultrasound coming up.  History of breast cancer: Bilateral lobular carcinoma in situ with subsequent prophylactic bilateral mastectomy and TRAM flap reconstruction.  Past medical history, Surgical history, Family history not pertinant except as noted below, Social history, Allergies, and medications have been entered into the medical record, reviewed, and no changes needed.   Review of Systems: No headache, visual changes, nausea, vomiting, diarrhea, constipation, dizziness, abdominal pain, skin rash, fevers, chills, night sweats, swollen lymph nodes, weight loss, chest pain, body aches, joint swelling, muscle aches, shortness of breath, mood changes, visual or auditory hallucinations.  Objective:    General: Well Developed, well nourished, and in no acute distress.  Neuro: Alert and oriented x3, extra-ocular muscles intact, sensation grossly intact. Cranial nerves II through XII are intact, motor, sensory, and coordinative functions are all intact. HEENT: Normocephalic, atraumatic, pupils equal round reactive to light, neck supple, no masses, no lymphadenopathy, thyroid nonpalpable. Oropharynx, nasopharynx, external ear canals are unremarkable. Skin: Warm and dry, no rashes noted.  Cardiac: Regular rate and rhythm, no murmurs rubs or gallops.  Respiratory: Clear to auscultation bilaterally. Not using accessory muscles, speaking in full sentences.  Abdominal: Soft, nontender, nondistended, positive bowel sounds, no masses, no organomegaly.  Musculoskeletal: Shoulder, elbow, wrist, hip, knee, ankle stable, and with full range of motion.  Impression and Recommendations:    The patient was counselled, risk factors were discussed,  anticipatory guidance given.

## 2014-07-12 NOTE — Assessment & Plan Note (Signed)
Ordering repeat echocardiogram for surveillance, this is also followed by [facility] cardiology, Dr. Stanford Breed.

## 2014-07-13 LAB — VITAMIN D 25 HYDROXY (VIT D DEFICIENCY, FRACTURES): Vit D, 25-Hydroxy: 16 ng/mL — ABNORMAL LOW (ref 30–100)

## 2014-07-14 MED ORDER — VITAMIN D (ERGOCALCIFEROL) 1.25 MG (50000 UNIT) PO CAPS: 50000.0000 [IU] | ORAL_CAPSULE | ORAL | Status: DC

## 2014-07-14 NOTE — Addendum Note (Signed)
Addended by: Silverio Decamp on: 07/14/2014 02:26 PM   Modules accepted: Orders

## 2014-07-17 ENCOUNTER — Ambulatory Visit (HOSPITAL_BASED_OUTPATIENT_CLINIC_OR_DEPARTMENT_OTHER)
Admission: RE | Admit: 2014-07-17 | Discharge: 2014-07-17 | Disposition: A | Discharge status: Home / Self Care | Payer: 59 | Source: Ambulatory Visit | Attending: Sports Medicine | Admitting: Sports Medicine

## 2014-07-17 DIAGNOSIS — Q231 Congenital insufficiency of aortic valve: Secondary | ICD-10-CM | POA: Diagnosis not present

## 2014-07-17 NOTE — Progress Notes (Signed)
  Echocardiogram 2D Echocardiogram has been performed.  Donata Clay 07/17/2014, 9:48 AM

## 2014-08-07 ENCOUNTER — Ambulatory Visit: Payer: Self-pay | Admitting: Cardiology

## 2014-08-07 ENCOUNTER — Encounter: Payer: Self-pay | Admitting: Cardiology

## 2014-08-07 ENCOUNTER — Ambulatory Visit (INDEPENDENT_AMBULATORY_CARE_PROVIDER_SITE_OTHER): Payer: 59 | Admitting: Cardiology

## 2014-08-07 VITALS — BP 134/86 | HR 71 | Ht 65.0 in | Wt 174.1 lb

## 2014-08-07 DIAGNOSIS — Q231 Congenital insufficiency of aortic valve: Secondary | ICD-10-CM

## 2014-08-07 DIAGNOSIS — Z8679 Personal history of other diseases of the circulatory system: Secondary | ICD-10-CM

## 2014-08-07 DIAGNOSIS — Z87898 Personal history of other specified conditions: Secondary | ICD-10-CM

## 2014-08-07 MED ORDER — DIAZEPAM 5 MG PO TABS: ORAL_TABLET | ORAL | Status: DC

## 2014-08-07 NOTE — Assessment & Plan Note (Signed)
Palpitations have actually improved since previous. We can consider a beta blocker in the future if they worsen.

## 2014-08-07 NOTE — Progress Notes (Signed)
     HPI: FU bicuspid aortic valve. Previously followed at [facility] cardiology. Stress echocardiogram in March of 2013 showed normal LV function, probable bicuspid aortic valve and no stress-induced wall motion abnormalities. Monitor in March of 2013 showed sinus rhythm with PACs and short bursts of SVT but no sustained arrhythmias. Echocardiogram repeated January 2016. Normal LV function. Bicuspid aortic valve with no aortic stenosis or aortic insufficiency. Mildly dilated aorta. Since she was last seen she has noticed mild increased dyspnea on exertion. No orthopnea, PND, pedal edema, syncope or chest pain. Occasional brief flutters but no sustained palpitations.   Current Outpatient Prescriptions  Medication Sig Dispense Refill  . valACYclovir (VALTREX) 1000 MG tablet Take 1 tablet (1,000 mg total) by mouth daily. 30 tablet 2  . Vitamin D, Ergocalciferol, (DRISDOL) 50000 UNITS CAPS capsule Take 1 capsule (50,000 Units total) by mouth every 7 (seven) days. 8 capsule 0  . diazepam (VALIUM) 5 MG tablet Take one tablet one hour prior to procedure 2 tablet 0   No current facility-administered medications for this visit.    No Known Allergies   Past Medical History  Diagnosis Date  . Breast cancer   . Bicuspid aortic valve   . Thyroid nodule   . Polycythemia vera   . Kidney cyst, acquired   . Palpitations     Past Surgical History  Procedure Laterality Date  . Mastectomy      bilateral  . Tubal ligation    . Partial nephrectomy      RT   . Tonsillectomy    . Dilitation & currettage/hystroscopy with thermachoice ablation N/A 12/28/2012    Procedure: DILATATION & CURETTAGE/HYSTEROSCOPY WITH THERMACHOICE ABLATION;  Surgeon: Cyril Mourning, MD;  Location: [facility];  Service: Gynecology;  Laterality: N/A;    History   Social History  . Marital Status: Married    Spouse Name: N/A  . Number of Children: 3  . Years of Education: N/A   Occupational History  . RN [employer]    Social History Main Topics  . Smoking status: Never Smoker   . Smokeless tobacco: Not on file  . Alcohol Use: Yes     Comment: Occasional  . Drug Use: No  . Sexual Activity: Not on file   Other Topics Concern  . Not on file   Social History Narrative    Family History  Problem Relation Age of Onset  . Cancer Mother     lung  . COPD Father   . Congestive Heart Failure Father   . Stroke Maternal Grandfather     ROS: no fevers or chills, productive cough, hemoptysis, dysphasia, odynophagia, melena, hematochezia, dysuria, hematuria, rash, seizure activity, orthopnea, PND, pedal edema, claudication. Remaining systems are negative.  Physical Exam:   Blood pressure 134/86, pulse 71, height 5' 5"  (1.651 m), weight 174 lb 1.9 oz (78.98 kg).  General:  Well developed/well nourished in NAD Skin warm/dry Patient not depressed No peripheral clubbing Back-normal HEENT-normal/normal eyelids Neck supple/normal carotid upstroke bilaterally; no bruits; no JVD; no thyromegaly chest - CTA/ normal expansion CV - RRR/normal S1 and S2; no murmurs, rubs or gallops;  PMI nondisplaced Abdomen -NT/ND, no HSM, no mass, + bowel sounds, no bruit 2+ femoral pulses, no bruits Ext-no edema, chords, 2+ DP Neuro-grossly nonfocal  ECG sinus rhythm at a rate of 71.no ST changes.

## 2014-08-07 NOTE — Patient Instructions (Signed)
Your physician wants you to follow-up in: [place] will receive a reminder letter in the mail two months in advance. If you don't receive a letter, please call our office to schedule the follow-up appointment.   MRA OF THE CHEST TO R/O THORACIC ANEURYSM  MRA OF THE HEAD TO R/O ANEURYSM  Your physician recommends that you HAVE LAB WORK TODAY

## 2014-08-07 NOTE — Assessment & Plan Note (Signed)
No aortic stenosis or aortic insufficiency on examination. She is describing some dyspnea. Check BNP. Given bicuspid aortic valve I will schedule an MRA of her thoracic aorta to exclude aneurysm. We will also schedule an MRA of her cerebral vasculature to exclude aneurysm.

## 2014-08-15 LAB — BRAIN NATRIURETIC PEPTIDE: Brain Natriuretic Peptide: 5.9 pg/mL (ref 0.0–100.0)

## 2014-08-21 ENCOUNTER — Ambulatory Visit
Admission: RE | Admit: 2014-08-21 | Discharge: 2014-08-21 | Disposition: A | Discharge status: Home / Self Care | Payer: 59 | Source: Ambulatory Visit | Attending: Cardiology | Admitting: Cardiology

## 2014-08-21 DIAGNOSIS — Q231 Congenital insufficiency of aortic valve: Secondary | ICD-10-CM

## 2014-08-21 MED ORDER — GADOBENATE DIMEGLUMINE 529 MG/ML IV SOLN
15.0000 mL | Freq: Once | INTRAVENOUS | Status: AC | PRN
  Administered 2014-08-21: 15 mL via INTRAVENOUS

## 2014-08-22 ENCOUNTER — Encounter: Payer: Self-pay | Admitting: Cardiology

## 2014-08-22 NOTE — Telephone Encounter (Signed)
This encounter was created in error - please disregard.

## 2014-08-22 NOTE — Telephone Encounter (Signed)
Pt called in stating that she had an MRA done yesterday and she would like to discuss the results with Hilda Blades. Please call back  She says when calling her job to just ask for her  Thanks

## 2014-09-02 ENCOUNTER — Ambulatory Visit
Admission: RE | Admit: 2014-09-02 | Discharge: 2014-09-02 | Disposition: A | Discharge status: Home / Self Care | Payer: 59 | Source: Ambulatory Visit | Attending: Internal Medicine | Admitting: Internal Medicine

## 2014-09-02 DIAGNOSIS — E049 Nontoxic goiter, unspecified: Secondary | ICD-10-CM

## 2014-09-03 ENCOUNTER — Encounter: Payer: Self-pay | Admitting: Sports Medicine

## 2014-09-04 MED ORDER — OSELTAMIVIR PHOSPHATE 75 MG PO CAPS: 75.0000 mg | ORAL_CAPSULE | Freq: Two times a day (BID) | ORAL | Status: DC

## 2014-09-05 ENCOUNTER — Other Ambulatory Visit: Payer: Self-pay | Admitting: Internal Medicine

## 2014-09-05 DIAGNOSIS — E041 Nontoxic single thyroid nodule: Secondary | ICD-10-CM

## 2014-10-30 ENCOUNTER — Other Ambulatory Visit: Payer: Self-pay | Admitting: Sports Medicine

## 2015-02-07 ENCOUNTER — Encounter: Payer: Self-pay | Admitting: Sports Medicine

## 2015-02-07 ENCOUNTER — Ambulatory Visit (INDEPENDENT_AMBULATORY_CARE_PROVIDER_SITE_OTHER): Payer: 59 | Admitting: Sports Medicine

## 2015-02-07 VITALS — BP 132/86 | HR 71 | Wt 183.0 lb

## 2015-02-07 DIAGNOSIS — F320 Major depressive disorder, single episode, mild: Secondary | ICD-10-CM | POA: Diagnosis not present

## 2015-02-07 DIAGNOSIS — R635 Abnormal weight gain: Secondary | ICD-10-CM

## 2015-02-07 DIAGNOSIS — F329 Major depressive disorder, single episode, unspecified: Secondary | ICD-10-CM | POA: Insufficient documentation

## 2015-02-07 LAB — CBC
HCT: 41.8 % (ref 36.0–46.0)
Hemoglobin: 13.6 g/dL (ref 12.0–15.0)
MCH: 28.9 pg (ref 26.0–34.0)
MCHC: 32.5 g/dL (ref 30.0–36.0)
MCV: 88.9 fL (ref 78.0–100.0)
MPV: 9.4 fL (ref 8.6–12.4)
Platelets: 186 10*3/uL (ref 150–400)
RBC: 4.7 MIL/uL (ref 3.87–5.11)
RDW: 13.4 % (ref 11.5–15.5)
WBC: 7.2 10*3/uL (ref 4.0–10.5)

## 2015-02-07 LAB — COMPREHENSIVE METABOLIC PANEL
ALT: 12 U/L (ref 6–29)
AST: 13 U/L (ref 10–35)
Albumin: 4 g/dL (ref 3.6–5.1)
Alkaline Phosphatase: 60 U/L (ref 33–115)
BUN: 13 mg/dL (ref 7–25)
CO2: 27 mmol/L (ref 20–31)
Calcium: 9.2 mg/dL (ref 8.6–10.2)
Chloride: 102 mmol/L (ref 98–110)
Creat: 0.62 mg/dL (ref 0.50–1.10)
Glucose, Bld: 83 mg/dL (ref 65–99)
Potassium: 4.2 mmol/L (ref 3.5–5.3)
Sodium: 141 mmol/L (ref 135–146)
Total Bilirubin: 0.5 mg/dL (ref 0.2–1.2)
Total Protein: 6.7 g/dL (ref 6.1–8.1)

## 2015-02-07 MED ORDER — ESCITALOPRAM OXALATE 10 MG PO TABS: 10.0000 mg | ORAL_TABLET | Freq: Every day | ORAL | Status: DC

## 2015-02-07 NOTE — Assessment & Plan Note (Signed)
Weight changes likely related to depressed mood, I am going to get her in with Dr. Jenne Campus with nutrition. Certainly weight loss medication could be a future and ever.

## 2015-02-07 NOTE — Progress Notes (Signed)
  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   Review of Systems: No fevers, chills, night sweats, weight loss, chest pain, or shortness of breath.   Objective:    General: Well Developed, well nourished, and in no acute distress.  Neuro: Alert and oriented x3, extra-ocular muscles intact, sensation grossly intact.  HEENT: Normocephalic, atraumatic, pupils equal round reactive to light, neck supple, no masses, no lymphadenopathy, thyroid nonpalpable.  Skin: Warm and dry, no rashes. Cardiac: Regular rate and rhythm, no murmurs rubs or gallops, no lower extremity edema.  Respiratory: Clear to auscultation bilaterally. Not using accessory muscles, speaking in full sentences.  Impression and Recommendations:    I spent 25 minutes with this patient, greater than 50% was face-to-face time counseling regarding the above diagnoses

## 2015-02-07 NOTE — Assessment & Plan Note (Signed)
History of postpartum depression 2, now with recurrent symptoms of crying, difficulty concentrating, and depressed mood. We are going to start Lexapro 10 mg daily. Her mother is a behavioral therapist so she can seek cognitive behavioral therapy through this avenue. Next line return in one month, PHQ9 at each visit.

## 2015-02-08 LAB — TSH: TSH: 1.308 u[IU]/mL (ref 0.350–4.500)

## 2015-03-07 ENCOUNTER — Ambulatory Visit (INDEPENDENT_AMBULATORY_CARE_PROVIDER_SITE_OTHER): Payer: 59 | Admitting: Sports Medicine

## 2015-03-07 ENCOUNTER — Encounter: Payer: Self-pay | Admitting: Sports Medicine

## 2015-03-07 VITALS — BP 136/91 | HR 83

## 2015-03-07 DIAGNOSIS — Z9889 Other specified postprocedural states: Secondary | ICD-10-CM

## 2015-03-07 DIAGNOSIS — Z9013 Acquired absence of bilateral breasts and nipples: Secondary | ICD-10-CM

## 2015-03-07 DIAGNOSIS — R635 Abnormal weight gain: Secondary | ICD-10-CM | POA: Diagnosis not present

## 2015-03-07 DIAGNOSIS — F320 Major depressive disorder, single episode, mild: Secondary | ICD-10-CM

## 2015-03-07 NOTE — Assessment & Plan Note (Signed)
Questionable mass in the left axilla, I am unable to palpated. We are going to obtain ultrasounds of both axillae.

## 2015-03-07 NOTE — Assessment & Plan Note (Signed)
Currently doing a carb free diet. She may continue this and if plateaus we can consider other weight loss medication.

## 2015-03-07 NOTE — Progress Notes (Signed)
  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  History of TRAM flap reconstruction of both breasts: Has been feeling a small, nontender left axillary mass, desires imaging however does not want to proceed with a breast ultrasound or mammogram.  Abnormal weight gain: Doing a low-carb hydrated diet, amenable to try this for a while before considering weight loss medication.  Past medical history, Surgical history, Family history not pertinant except as noted below, Social history, Allergies, and medications have been entered into the medical record, reviewed, and no changes needed.   Review of Systems: No fevers, chills, night sweats, weight loss, chest pain, or shortness of breath.   Objective:    General: Well Developed, well nourished, and in no acute distress.  Neuro: Alert and oriented x3, extra-ocular muscles intact, sensation grossly intact.  HEENT: Normocephalic, atraumatic, pupils equal round reactive to light, neck supple, no masses, no lymphadenopathy, thyroid nonpalpable.  Skin: Warm and dry, no rashes. Cardiac: Regular rate and rhythm, no murmurs rubs or gallops, no lower extremity edema.  Respiratory: Clear to auscultation bilaterally. Not using accessory muscles, speaking in full sentences.  Impression and Recommendations:

## 2015-03-07 NOTE — Assessment & Plan Note (Signed)
Doing extremely well, no changes in Lexapro 10

## 2015-03-19 ENCOUNTER — Other Ambulatory Visit: Payer: 59

## 2015-03-24 ENCOUNTER — Ambulatory Visit (INDEPENDENT_AMBULATORY_CARE_PROVIDER_SITE_OTHER): Payer: 59

## 2015-03-24 DIAGNOSIS — Z9013 Acquired absence of bilateral breasts and nipples: Secondary | ICD-10-CM

## 2015-03-24 DIAGNOSIS — R938 Abnormal findings on diagnostic imaging of other specified body structures: Secondary | ICD-10-CM

## 2015-03-30 ENCOUNTER — Encounter: Payer: Self-pay | Admitting: Sports Medicine

## 2015-03-31 ENCOUNTER — Encounter: Payer: Self-pay | Admitting: Sports Medicine

## 2015-03-31 MED ORDER — ERYTHROMYCIN 5 MG/GM OP OINT: 1.0000 "application " | TOPICAL_OINTMENT | Freq: Three times a day (TID) | OPHTHALMIC | Status: DC

## 2015-06-05 ENCOUNTER — Encounter: Payer: Self-pay | Admitting: Sports Medicine

## 2015-06-05 ENCOUNTER — Ambulatory Visit (INDEPENDENT_AMBULATORY_CARE_PROVIDER_SITE_OTHER): Payer: 59 | Admitting: Sports Medicine

## 2015-06-05 VITALS — BP 132/87 | HR 79 | Temp 98.7°F | Resp 18 | Wt 178.4 lb

## 2015-06-05 DIAGNOSIS — R635 Abnormal weight gain: Secondary | ICD-10-CM

## 2015-06-05 DIAGNOSIS — F320 Major depressive disorder, single episode, mild: Secondary | ICD-10-CM | POA: Diagnosis not present

## 2015-06-05 MED ORDER — LIRAGLUTIDE -WEIGHT MANAGEMENT 18 MG/3ML ~~LOC~~ SOPN: 3.0000 mg | PEN_INJECTOR | Freq: Every day | SUBCUTANEOUS | Status: DC

## 2015-06-05 NOTE — Assessment & Plan Note (Signed)
Starting Saxenda. 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 She has a BMI greater than 27 plus knee pain, and hyperlipidemia. Declines phentermine, does have a bicuspid aortic valve.

## 2015-06-05 NOTE — Progress Notes (Signed)
  Subjective:    CC: Follow-up  HPI: Depression: Symptoms are now completely resolved with 10 mg of Lexapro.  Abdomen normal weight gain: Agreeable to try GLP-1 agonist.  Past medical history, Surgical history, Family history not pertinant except as noted below, Social history, Allergies, and medications have been entered into the medical record, reviewed, and no changes needed.   Review of Systems: No fevers, chills, night sweats, weight loss, chest pain, or shortness of breath.   Objective:    General: Well Developed, well nourished, and in no acute distress.  Neuro: Alert and oriented x3, extra-ocular muscles intact, sensation grossly intact.  HEENT: Normocephalic, atraumatic, pupils equal round reactive to light, neck supple, no masses, no lymphadenopathy, thyroid nonpalpable.  Skin: Warm and dry, no rashes. Cardiac: Regular rate and rhythm, no murmurs rubs or gallops, no lower extremity edema.  Respiratory: Clear to auscultation bilaterally. Not using accessory muscles, speaking in full sentences.  Impression and Recommendations:    I spent 25 minutes with this patient, greater than 50% was face-to-face time counseling regarding the above diagnoses

## 2015-06-05 NOTE — Assessment & Plan Note (Signed)
Symptoms completely resolved on Lexapro 10.

## 2015-06-06 ENCOUNTER — Ambulatory Visit: Payer: 59 | Admitting: Sports Medicine

## 2015-06-13 ENCOUNTER — Telehealth: Payer: Self-pay | Admitting: Sports Medicine

## 2015-06-13 NOTE — Telephone Encounter (Signed)
Received fax for prior authorization on Saxenda sent through cover my meds waiting on authorization. - CF

## 2015-06-17 NOTE — Telephone Encounter (Signed)
Received  fax from [employer] and Kirke Shaggy has been approved from 06/13/2015 - 10/11/2015. Case Id [id number] - CF

## 2015-06-24 ENCOUNTER — Other Ambulatory Visit: Payer: Self-pay | Admitting: Sports Medicine

## 2015-06-27 ENCOUNTER — Other Ambulatory Visit: Payer: Self-pay | Admitting: Sports Medicine

## 2015-06-27 ENCOUNTER — Encounter: Payer: Self-pay | Admitting: Sports Medicine

## 2015-07-08 ENCOUNTER — Ambulatory Visit: Payer: 59 | Admitting: Sports Medicine

## 2015-07-23 MED FILL — ESCITALOPRAM 10 MG TABLET: 10 | 30 days supply | Qty: 30 | Fill #1

## 2015-07-28 ENCOUNTER — Encounter: Payer: Self-pay | Admitting: Sports Medicine

## 2015-07-28 ENCOUNTER — Ambulatory Visit (INDEPENDENT_AMBULATORY_CARE_PROVIDER_SITE_OTHER): Payer: 59 | Admitting: Sports Medicine

## 2015-07-28 DIAGNOSIS — R635 Abnormal weight gain: Secondary | ICD-10-CM | POA: Diagnosis not present

## 2015-07-28 MED ORDER — TOPIRAMATE 50 MG PO TABS: ORAL_TABLET | ORAL | Status: DC

## 2015-07-28 MED ORDER — LIRAGLUTIDE -WEIGHT MANAGEMENT 18 MG/3ML ~~LOC~~ SOPN: 3.0000 mg | PEN_INJECTOR | Freq: Every day | SUBCUTANEOUS | Status: DC

## 2015-07-28 NOTE — Assessment & Plan Note (Signed)
1 pound weight gain after 2 months on Saxenda. Refilling Saxenda, return to see me in 3 months. I am also going to add Topamax to assist with weight loss, this will also improve her catamenial migraines.

## 2015-07-28 NOTE — Progress Notes (Signed)
  Subjective:    CC: follow-up  HPI: Abnormal weight gain: Has been on Saxenda now for 2 months, has remained about the same. Agreeable to add an additional medication to assist with weight loss.  Past medical history, Surgical history, Family history not pertinant except as noted below, Social history, Allergies, and medications have been entered into the medical record, reviewed, and no changes needed.   Review of Systems: No fevers, chills, night sweats, weight loss, chest pain, or shortness of breath.   Objective:    General: Well Developed, well nourished, and in no acute distress.  Neuro: Alert and oriented x3, extra-ocular muscles intact, sensation grossly intact.  HEENT: Normocephalic, atraumatic, pupils equal round reactive to light, neck supple, no masses, no lymphadenopathy, thyroid nonpalpable.  Skin: Warm and dry, no rashes. Cardiac: Regular rate and rhythm, no murmurs rubs or gallops, no lower extremity edema.  Respiratory: Clear to auscultation bilaterally. Not using accessory muscles, speaking in full sentences.  Impression and Recommendations:    I spent 25 minutes with this patient, greater than 50% was face-to-face time counseling regarding the above diagnoses

## 2015-07-29 MED FILL — SAXENDA 18 MG/3 ML PEN: 18 | 30 days supply | Qty: 15 | Fill #0

## 2015-07-29 MED FILL — TOPIRAMATE 50 MG TABLET: 50 | 30 days supply | Qty: 30 | Fill #0

## 2015-08-05 ENCOUNTER — Telehealth: Payer: Self-pay | Admitting: Sports Medicine

## 2015-08-05 DIAGNOSIS — R635 Abnormal weight gain: Secondary | ICD-10-CM

## 2015-08-05 MED ORDER — LIRAGLUTIDE -WEIGHT MANAGEMENT 18 MG/3ML ~~LOC~~ SOPN: 3.0000 mg | PEN_INJECTOR | Freq: Every day | SUBCUTANEOUS | Status: DC

## 2015-08-05 NOTE — Progress Notes (Signed)
      HPI: FU bicuspid aortic valve. Previously followed at [facility] cardiology. Stress echocardiogram in March of 2013 showed normal LV function, probable bicuspid aortic valve and no stress-induced wall motion abnormalities. Monitor in March of 2013 showed sinus rhythm with PACs and short bursts of SVT but no sustained arrhythmias. Echocardiogram repeated January 2016. Normal LV function. Bicuspid aortic valve with no aortic stenosis or aortic insufficiency. Mildly dilated aorta. MRA February 2016 showed ectasia of ascending thoracic aorta at 37 mm. MRAo of cerebral vasculature February 2016 showed no aneurysm. Since she was last seen   Current Outpatient Prescriptions  Medication Sig Dispense Refill  . escitalopram (LEXAPRO) 10 MG tablet TAKE 1 TABLET BY MOUTH ONCE DAILY 30 tablet PRN  . Liraglutide -Weight Management (SAXENDA) 18 MG/3ML SOPN Inject 3 mg into the skin daily. 3 mg injected subcut daily 5 pen 11  . topiramate (TOPAMAX) 50 MG tablet One half tab by mouth daily for a week, then one tab by mouth daily. 30 tablet 11   No current facility-administered medications for this visit.     Past Medical History  Diagnosis Date  . Breast cancer ([place])   . Bicuspid aortic valve   . Thyroid nodule   . Polycythemia vera ([place])   . Kidney cyst, acquired   . Palpitations     Past Surgical History  Procedure Laterality Date  . Mastectomy      bilateral  . Tubal ligation    . Partial nephrectomy      RT   . Tonsillectomy    . Dilitation & currettage/hystroscopy with thermachoice ablation N/A 12/28/2012    Procedure: DILATATION & CURETTAGE/HYSTEROSCOPY WITH THERMACHOICE ABLATION;  Surgeon: Cyril Mourning, MD;  Location: [place] [facility];  Service: Gynecology;  Laterality: N/A;    Social History   Social History  . Marital Status: Married    Spouse Name: N/A  . Number of Children: 3  . Years of Education: N/A   Occupational History  . RN [employer]   Social History Main Topics    . Smoking status: Never Smoker   . Smokeless tobacco: Not on file  . Alcohol Use: Yes     Comment: Occasional  . Drug Use: No  . Sexual Activity: Not on file   Other Topics Concern  . Not on file   Social History Narrative    Family History  Problem Relation Age of Onset  . Cancer Mother     lung  . COPD Father   . Congestive Heart Failure Father   . Stroke Maternal Grandfather     ROS: no fevers or chills, productive cough, hemoptysis, dysphasia, odynophagia, melena, hematochezia, dysuria, hematuria, rash, seizure activity, orthopnea, PND, pedal edema, claudication. Remaining systems are negative.  Physical Exam: Well-developed well-nourished in no acute distress.  Skin is warm and dry.  HEENT is normal.  Neck is supple.  Chest is clear to auscultation with normal expansion.  Cardiovascular exam is regular rate and rhythm.  Abdominal exam nontender or distended. No masses palpated. Extremities show no edema. neuro grossly intact  ECG     This encounter was created in error - please disregard.

## 2015-08-05 NOTE — Telephone Encounter (Signed)
Notified patient that the prescription for the needles was sent to the pharmacy.

## 2015-08-05 NOTE — Telephone Encounter (Signed)
I have no problem refilling it. Sent in.

## 2015-08-05 NOTE — Telephone Encounter (Signed)
Pt called.  We have not responded to pharmacy's request for refill on Saxenda. Thank you

## 2015-08-11 ENCOUNTER — Encounter: Payer: 59 | Admitting: Cardiology

## 2015-08-11 NOTE — Progress Notes (Signed)
      HPI: FU bicuspid aortic valve. Previously followed at [facility] cardiology. Stress echocardiogram in March of 2013 showed normal LV function, probable bicuspid aortic valve and no stress-induced wall motion abnormalities. Monitor in March of 2013 showed sinus rhythm with PACs and short bursts of SVT but no sustained arrhythmias. Echocardiogram repeated January 2016. Normal LV function. Bicuspid aortic valve with no aortic stenosis or aortic insufficiency. Mildly dilated aorta. MRA February 2016 showed ectasia of ascending thoracic aorta at 37 mm. MRA of cerebral vasculature February 2016 showed no aneurysm. Since she was last seen the patient has dyspnea with more extreme activities but not with routine activities. It is relieved with rest. It is not associated with chest pain. There is no orthopnea, PND or pedal edema. There is no syncope. There is no exertional chest pain. She has occasional brief palpitations that can be terminated with Valsalva.   Current Outpatient Prescriptions  Medication Sig Dispense Refill  . escitalopram (LEXAPRO) 10 MG tablet TAKE 1 TABLET BY MOUTH ONCE DAILY 30 tablet PRN  . Liraglutide -Weight Management (SAXENDA) 18 MG/3ML SOPN Inject 3 mg into the skin daily. 3 mg injected subcut daily 5 pen 11  . topiramate (TOPAMAX) 50 MG tablet One half tab by mouth daily for a week, then one tab by mouth daily. 30 tablet 11   No current facility-administered medications for this visit.     Past Medical History  Diagnosis Date  . Breast cancer ([place])   . Bicuspid aortic valve   . Thyroid nodule   . Polycythemia vera ([place])   . Kidney cyst, acquired   . Palpitations     Past Surgical History  Procedure Laterality Date  . Mastectomy      bilateral  . Tubal ligation    . Partial nephrectomy      RT   . Tonsillectomy    . Dilitation & currettage/hystroscopy with thermachoice ablation N/A 12/28/2012    Procedure: DILATATION & CURETTAGE/HYSTEROSCOPY WITH  THERMACHOICE ABLATION;  Surgeon: Cyril Mourning, MD;  Location: [facility];  Service: Gynecology;  Laterality: N/A;    Social History   Social History  . Marital Status: Married    Spouse Name: N/A  . Number of Children: 3  . Years of Education: N/A   Occupational History  . RN [employer]   Social History Main Topics  . Smoking status: Never Smoker   . Smokeless tobacco: Not on file  . Alcohol Use: Yes     Comment: Occasional  . Drug Use: No  . Sexual Activity: Not on file   Other Topics Concern  . Not on file   Social History Narrative    Family History  Problem Relation Age of Onset  . Cancer Mother     lung  . COPD Father   . Congestive Heart Failure Father   . Stroke Maternal Grandfather     ROS: no fevers or chills, productive cough, hemoptysis, dysphasia, odynophagia, melena, hematochezia, dysuria, hematuria, rash, seizure activity, orthopnea, PND, pedal edema, claudication. Remaining systems are negative.  Physical Exam: Well-developed well-nourished in no acute distress.  Skin is warm and dry.  HEENT is normal.  Neck is supple.  Chest is clear to auscultation with normal expansion.  Cardiovascular exam is regular rate and rhythm.  Abdominal exam nontender or distended. No masses palpated. Extremities show no edema. neuro grossly intact  ECG Sinus rhythm at a rate of 74.No ST changes.

## 2015-08-13 ENCOUNTER — Encounter: Payer: Self-pay | Admitting: Cardiology

## 2015-08-20 ENCOUNTER — Ambulatory Visit (INDEPENDENT_AMBULATORY_CARE_PROVIDER_SITE_OTHER): Payer: 59 | Admitting: Cardiology

## 2015-08-20 ENCOUNTER — Encounter: Payer: Self-pay | Admitting: Cardiology

## 2015-08-20 VITALS — BP 152/88 | HR 74 | Ht 65.0 in | Wt 179.4 lb

## 2015-08-20 DIAGNOSIS — I10 Essential (primary) hypertension: Secondary | ICD-10-CM | POA: Diagnosis not present

## 2015-08-20 DIAGNOSIS — I359 Nonrheumatic aortic valve disorder, unspecified: Secondary | ICD-10-CM | POA: Diagnosis not present

## 2015-08-20 DIAGNOSIS — Z87898 Personal history of other specified conditions: Secondary | ICD-10-CM

## 2015-08-20 DIAGNOSIS — Z8679 Personal history of other diseases of the circulatory system: Secondary | ICD-10-CM

## 2015-08-20 DIAGNOSIS — Q231 Congenital insufficiency of aortic valve: Secondary | ICD-10-CM

## 2015-08-20 DIAGNOSIS — I712 Thoracic aortic aneurysm, without rupture, unspecified: Secondary | ICD-10-CM | POA: Insufficient documentation

## 2015-08-20 MED ORDER — METOPROLOL SUCCINATE ER 25 MG PO TB24: 25.0000 mg | ORAL_TABLET | Freq: Every day | ORAL | Status: DC

## 2015-08-20 MED FILL — ESCITALOPRAM 10 MG TABLET: 10 | 30 days supply | Qty: 30 | Fill #2

## 2015-08-20 MED FILL — METOPROLOL SUCC ER 25 MG TA: 25 | 90 days supply | Qty: 90 | Fill #0

## 2015-08-20 NOTE — Assessment & Plan Note (Signed)
Blood pressure is elevated. Add Toprol 25 mg daily both for blood pressure and palpitations. Increase as needed. We can consider an ARB in the future if blood pressure not controlled with beta blockade alone particularly in light of upper normal thoracic aorta and bicuspid valve.

## 2015-08-20 NOTE — Assessment & Plan Note (Signed)
No evidence of aortic stenosis or aortic insufficiency on most recent echocardiogram. She will need follow-up studies in the future.

## 2015-08-20 NOTE — Assessment & Plan Note (Signed)
Patient's MRA previously showed upper normal dimensions of thoracic aorta. I will plan to repeat this in the future.

## 2015-08-20 NOTE — Assessment & Plan Note (Signed)
Patient continues to have occasional palpitations that sound potentially to be in SVT as they are terminated with Valsalva.I have instructed her that if she can have an EKG or rhythm strip of this it would be helpful in identifying the rhythm disturbance. Symptoms are infrequent at present. Add Toprol 25 mg daily.

## 2015-08-20 NOTE — Patient Instructions (Signed)
Medication Instructions:   START METOPROLOL SUCC ER 25 MG ONCE DAILY  Follow-Up:  Your physician wants you to follow-up in: [place] will receive a reminder letter in the mail two months in advance. If you don't receive a letter, please call our office to schedule the follow-up appointment.   If you need a refill on your cardiac medications before your next appointment, please call your pharmacy.

## 2015-08-27 MED FILL — SAXENDA 18 MG/3 ML PEN: 18 | 30 days supply | Qty: 15 | Fill #1

## 2015-08-27 MED FILL — UNIFINE PENTIPS 8MM 31G: 31G X 8 MM | 90 days supply | Qty: 100 | Fill #0

## 2015-09-08 ENCOUNTER — Encounter: Payer: Self-pay | Admitting: Sports Medicine

## 2015-09-08 ENCOUNTER — Other Ambulatory Visit: Payer: Self-pay | Admitting: Internal Medicine

## 2015-09-08 DIAGNOSIS — E041 Nontoxic single thyroid nodule: Secondary | ICD-10-CM

## 2015-09-08 DIAGNOSIS — E042 Nontoxic multinodular goiter: Secondary | ICD-10-CM | POA: Diagnosis not present

## 2015-09-11 ENCOUNTER — Ambulatory Visit
Admission: RE | Admit: 2015-09-11 | Discharge: 2015-09-11 | Disposition: A | Discharge status: Home / Self Care | Payer: 59 | Source: Ambulatory Visit | Attending: Internal Medicine | Admitting: Internal Medicine

## 2015-09-11 DIAGNOSIS — E041 Nontoxic single thyroid nodule: Secondary | ICD-10-CM | POA: Diagnosis not present

## 2015-09-17 ENCOUNTER — Other Ambulatory Visit: Payer: Self-pay | Admitting: Internal Medicine

## 2015-09-17 DIAGNOSIS — E041 Nontoxic single thyroid nodule: Secondary | ICD-10-CM

## 2015-09-18 MED FILL — ESCITALOPRAM 10 MG TABLET: 10 | 30 days supply | Qty: 30 | Fill #3

## 2015-09-23 ENCOUNTER — Other Ambulatory Visit (HOSPITAL_COMMUNITY)
Admission: RE | Admit: 2015-09-23 | Discharge: 2015-09-23 | Disposition: A | Discharge status: Home / Self Care | Payer: 59 | Source: Ambulatory Visit | Attending: Radiology | Admitting: Radiology

## 2015-09-23 ENCOUNTER — Ambulatory Visit
Admission: RE | Admit: 2015-09-23 | Discharge: 2015-09-23 | Disposition: A | Discharge status: Home / Self Care | Payer: 59 | Source: Ambulatory Visit | Attending: Internal Medicine | Admitting: Internal Medicine

## 2015-09-23 DIAGNOSIS — E041 Nontoxic single thyroid nodule: Secondary | ICD-10-CM | POA: Diagnosis not present

## 2015-10-08 MED FILL — IBUPROFEN 800 MG TABLET: 800 | 20 days supply | Qty: 60 | Fill #2

## 2015-10-22 MED FILL — ESCITALOPRAM 10 MG TABLET: 10 | 30 days supply | Qty: 30 | Fill #4

## 2015-10-28 ENCOUNTER — Ambulatory Visit: Payer: 59 | Admitting: Sports Medicine

## 2015-11-13 MED FILL — METOPROLOL SUCC ER 25 MG TA: 25 | 90 days supply | Qty: 90 | Fill #1

## 2015-11-21 MED FILL — ESCITALOPRAM 10 MG TABLET: 10 | 30 days supply | Qty: 30 | Fill #5

## 2015-12-23 MED FILL — ESCITALOPRAM 10 MG TABLET: 10 | 30 days supply | Qty: 30 | Fill #6

## 2016-01-19 MED FILL — ESCITALOPRAM 10 MG TABLET: 10 | 30 days supply | Qty: 30 | Fill #7

## 2016-01-27 DIAGNOSIS — H52203 Unspecified astigmatism, bilateral: Secondary | ICD-10-CM | POA: Diagnosis not present

## 2016-01-27 DIAGNOSIS — H524 Presbyopia: Secondary | ICD-10-CM | POA: Diagnosis not present

## 2016-01-27 DIAGNOSIS — Z135 Encounter for screening for eye and ear disorders: Secondary | ICD-10-CM | POA: Diagnosis not present

## 2016-02-20 MED FILL — METOPROLOL SUCC ER 25 MG TA: 25 | 90 days supply | Qty: 90 | Fill #2

## 2016-02-20 MED FILL — ESCITALOPRAM 10 MG TABLET: 10 | 30 days supply | Qty: 30 | Fill #8

## 2016-03-31 MED FILL — ESCITALOPRAM 10 MG TABLET: 10 | 30 days supply | Qty: 30 | Fill #9

## 2016-04-01 ENCOUNTER — Telehealth: Payer: Self-pay

## 2016-04-06 NOTE — Telephone Encounter (Signed)
Error

## 2016-05-12 MED FILL — ESCITALOPRAM 10 MG TABLET: 10 | 30 days supply | Qty: 30 | Fill #10

## 2016-06-15 MED FILL — METOPROLOL SUCC ER 25 MG TA: 25 | 90 days supply | Qty: 90 | Fill #3

## 2016-06-15 MED FILL — ESCITALOPRAM 10 MG TABLET: 10 | 30 days supply | Qty: 30 | Fill #11

## 2016-08-09 ENCOUNTER — Ambulatory Visit (INDEPENDENT_AMBULATORY_CARE_PROVIDER_SITE_OTHER): Payer: 59 | Admitting: Sports Medicine

## 2016-08-09 ENCOUNTER — Encounter: Payer: Self-pay | Admitting: Sports Medicine

## 2016-08-09 DIAGNOSIS — E669 Obesity, unspecified: Secondary | ICD-10-CM | POA: Diagnosis not present

## 2016-08-09 DIAGNOSIS — Z0000 Encounter for general adult medical examination without abnormal findings: Secondary | ICD-10-CM

## 2016-08-09 DIAGNOSIS — E66811 Obesity, class 1: Secondary | ICD-10-CM

## 2016-08-09 DIAGNOSIS — F325 Major depressive disorder, single episode, in full remission: Secondary | ICD-10-CM | POA: Diagnosis not present

## 2016-08-09 LAB — CBC
HCT: 41.2 % (ref 35.0–45.0)
Hemoglobin: 13.7 g/dL (ref 11.7–15.5)
MCH: 29 pg (ref 27.0–33.0)
MCHC: 33.3 g/dL (ref 32.0–36.0)
MCV: 87.1 fL (ref 80.0–100.0)
MPV: 9.3 fL (ref 7.5–12.5)
Platelets: 204 10*3/uL (ref 140–400)
RBC: 4.73 MIL/uL (ref 3.80–5.10)
RDW: 13.4 % (ref 11.0–15.0)
WBC: 10.1 10*3/uL (ref 3.8–10.8)

## 2016-08-09 LAB — COMPREHENSIVE METABOLIC PANEL
ALT: 12 U/L (ref 6–29)
AST: 15 U/L (ref 10–35)
Albumin: 4.1 g/dL (ref 3.6–5.1)
Alkaline Phosphatase: 76 U/L (ref 33–115)
BUN: 12 mg/dL (ref 7–25)
CO2: 27 mmol/L (ref 20–31)
Calcium: 9.3 mg/dL (ref 8.6–10.2)
Chloride: 102 mmol/L (ref 98–110)
Creat: 0.79 mg/dL (ref 0.50–1.10)
Glucose, Bld: 89 mg/dL (ref 65–99)
Potassium: 4.2 mmol/L (ref 3.5–5.3)
Sodium: 138 mmol/L (ref 135–146)
Total Bilirubin: 0.5 mg/dL (ref 0.2–1.2)
Total Protein: 7.1 g/dL (ref 6.1–8.1)

## 2016-08-09 LAB — HEMOGLOBIN A1C
Hgb A1c MFr Bld: 5 % (ref <5.7)
Mean Plasma Glucose: 97 mg/dL

## 2016-08-09 LAB — LIPID PANEL W/REFLEX DIRECT LDL
Cholesterol: 238 mg/dL — ABNORMAL HIGH (ref <200)
HDL: 112 mg/dL (ref >50)
LDL-Cholesterol: 99 mg/dL
Non-HDL Cholesterol (Calc): 126 mg/dL (ref <130)
Total CHOL/HDL Ratio: 2.1 Ratio (ref <5.0)
Triglycerides: 167 mg/dL — ABNORMAL HIGH (ref <150)

## 2016-08-09 LAB — TSH: TSH: 1.5 mIU/L

## 2016-08-09 MED ORDER — NALTREXONE-BUPROPION HCL ER 8-90 MG PO TB12: ORAL_TABLET | ORAL | 0 refills | Status: DC

## 2016-08-09 MED ORDER — METFORMIN HCL ER 750 MG PO TB24
750.0000 mg | ORAL_TABLET | Freq: Every day | ORAL | 3 refills | Status: DC
Start: 2016-08-09 — End: 2016-09-30

## 2016-08-09 MED ORDER — TRIAMCINOLONE ACETONIDE 0.1 % EX CREA: 454.0000 "application " | TOPICAL_CREAM | Freq: Two times a day (BID) | CUTANEOUS | 11 refills | Status: DC

## 2016-08-09 MED FILL — TRIAMCINOLONE 0.1% CREAM: 0.1 | 30 days supply | Qty: 454 | Fill #0

## 2016-08-09 MED FILL — METFORMIN HCL ER 750 MG TAB: 750 | 30 days supply | Qty: 30 | Fill #0

## 2016-08-09 NOTE — Assessment & Plan Note (Signed)
Patient unable to tolerate Lexapro, and unhappy with the weight gain. She will note some improvement in mood with the bupropion component of Contrave.

## 2016-08-09 NOTE — Progress Notes (Signed)
  Subjective:    CC: Annual physical exam  HPI:  This is a pleasant 46 year old female, here for her physical. She really wants to discuss weight loss. She did well on Saxenda last year, discontinued it and has gained almost 30 pounds. She is desperate to lose weight. Has tried and failed Belviq in the past, Saxenda, and due to her cardiac valvulopathy we have avoided phentermine. She has tried diet and exercise without much improvement.  Past medical history:  Negative.  See flowsheet/record as well for more information.  Surgical history: Negative.  See flowsheet/record as well for more information.  Family history: Negative.  See flowsheet/record as well for more information.  Social history: Negative.  See flowsheet/record as well for more information.  Allergies, and medications have been entered into the medical record, reviewed, and no changes needed.    Review of Systems: No headache, visual changes, nausea, vomiting, diarrhea, constipation, dizziness, abdominal pain, skin rash, fevers, chills, night sweats, swollen lymph nodes, weight loss, chest pain, body aches, joint swelling, muscle aches, shortness of breath, mood changes, visual or auditory hallucinations.  Objective:    General: Well Developed, well nourished, and in no acute distress.  Neuro: Alert and oriented x3, extra-ocular muscles intact, sensation grossly intact. Cranial nerves II through XII are intact, motor, sensory, and coordinative functions are all intact. HEENT: Normocephalic, atraumatic, pupils equal round reactive to light, neck supple, no masses, no lymphadenopathy, thyroid nonpalpable. Oropharynx, nasopharynx, external ear canals are unremarkable. Skin: Warm and dry, no rashes noted.  Cardiac: Regular rate and rhythm, no murmurs rubs or gallops.  Respiratory: Clear to auscultation bilaterally. Not using accessory muscles, speaking in full sentences.  Abdominal: Soft, nontender, nondistended, positive bowel  sounds, no masses, no organomegaly.  Musculoskeletal: Shoulder, elbow, wrist, hip, knee, ankle stable, and with full range of motion.  Impression and Recommendations:    The patient was counselled, risk factors were discussed, anticipatory guidance given.  Annual physical exam Routine physical as above, Pap smear is coming up.  Obesity (BMI 30.0-34.9) We have tried and failed Belviq, she has a contraindication to phentermine, Saxenda was ineffective/intolerable. Starting Contrave. Also happy to give her some metformin.  Major depression, single episode Patient unable to tolerate Lexapro, and unhappy with the weight gain. She will note some improvement in mood with the bupropion component of Contrave.

## 2016-08-09 NOTE — Assessment & Plan Note (Addendum)
Routine physical as above, Pap smear is coming up.

## 2016-08-09 NOTE — Assessment & Plan Note (Signed)
We have tried and failed Belviq, she has a contraindication to phentermine, Saxenda was ineffective/intolerable. Starting Contrave. Also happy to give her some metformin.

## 2016-08-10 LAB — VITAMIN D 25 HYDROXY (VIT D DEFICIENCY, FRACTURES): Vit D, 25-Hydroxy: 15 ng/mL — ABNORMAL LOW (ref 30–100)

## 2016-08-10 LAB — HIV ANTIBODY (ROUTINE TESTING W REFLEX): HIV 1&2 Ab, 4th Generation: NONREACTIVE

## 2016-08-10 MED ORDER — VITAMIN D (ERGOCALCIFEROL) 1.25 MG (50000 UNIT) PO CAPS: 50000.0000 [IU] | ORAL_CAPSULE | ORAL | 0 refills | Status: DC

## 2016-08-10 MED FILL — VIT D2 1.25 MG (50,000 UNIT: 1.25 MG | 56 days supply | Qty: 8 | Fill #0

## 2016-08-10 NOTE — Addendum Note (Signed)
Addended by: Silverio Decamp on: 08/10/2016 08:34 AM   Modules accepted: Orders

## 2016-08-17 MED FILL — CONTRAVE ER 8-90 MG TABLET: 8-90 | 3 days supply | Qty: 7 | Fill #0

## 2016-08-18 ENCOUNTER — Telehealth: Payer: Self-pay | Admitting: *Deleted

## 2016-08-18 ENCOUNTER — Telehealth: Payer: Self-pay | Admitting: Sports Medicine

## 2016-08-18 NOTE — Telephone Encounter (Signed)
Pre Authorization sent to cover my meds.UFAYCH

## 2016-08-18 NOTE — Telephone Encounter (Signed)
Unable to get Contrave approved so I just called myself, spoke to Joe. Approval sent in as urgent, information provided.   Approval should come tomorrow. PA# [id number]

## 2016-08-22 IMAGING — US US MISC SOFT TISSUE
1 series · 14 of 16 positions shown · non-contrast
Comparison: None.

CLINICAL DATA: Two month history of a pea-sized lump in the lateral
aspect of the left axilla ; history of left mastectomy with a tram
flap in 7669 for breast malignancy.

EXAM:
SOFT TISSUE ULTRASOUND - MISCELLANEOUS
TECHNIQUE: Real-time ultrasound was performed of the symptomatic area with
comparison views of the right axillary region.

[Series 1: us misc soft tissue · 0.07mm/px · 14 of 26 slices shown]
[im 1/26]
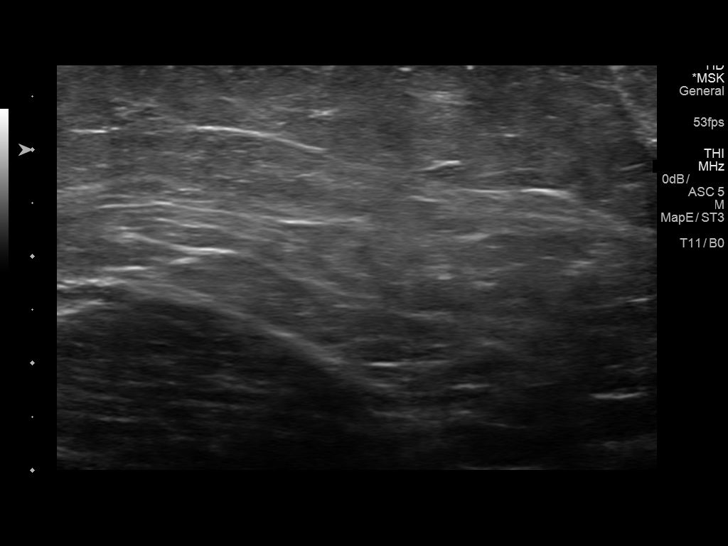
[im 2/26]
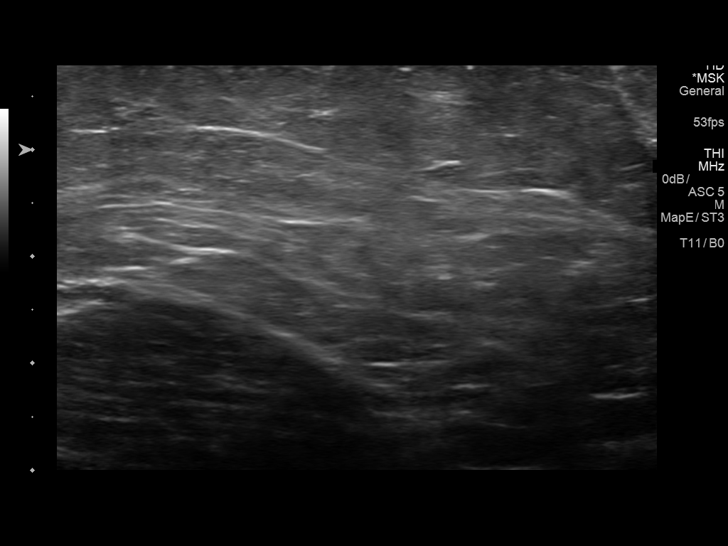
[im 4/26]
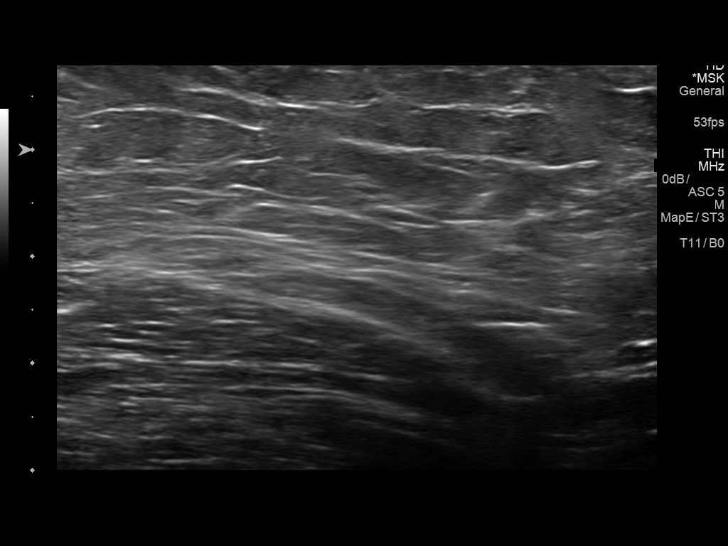
[im 7/26]
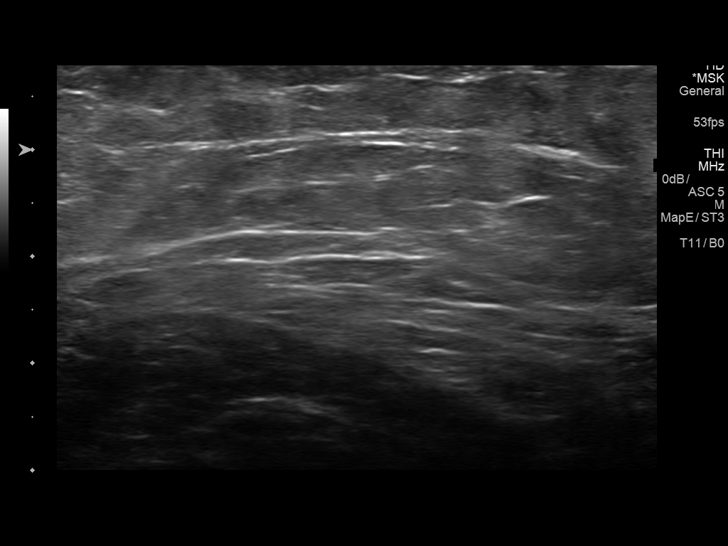
[im 9/26]
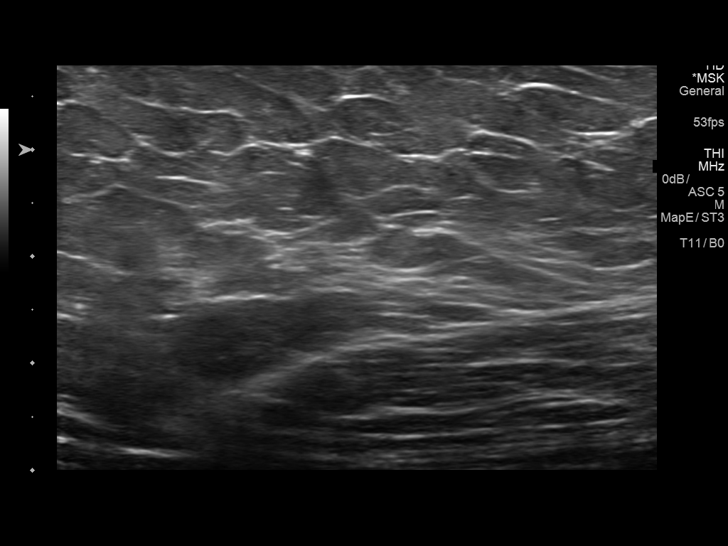
[im 11/26]
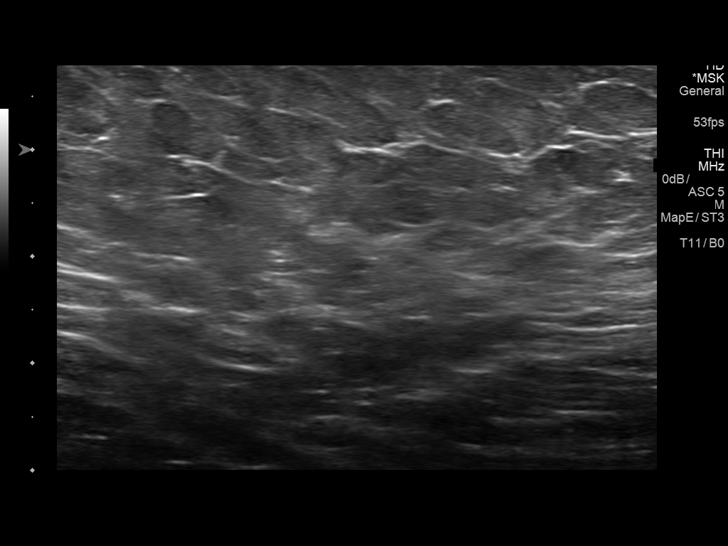
[im 12/26]
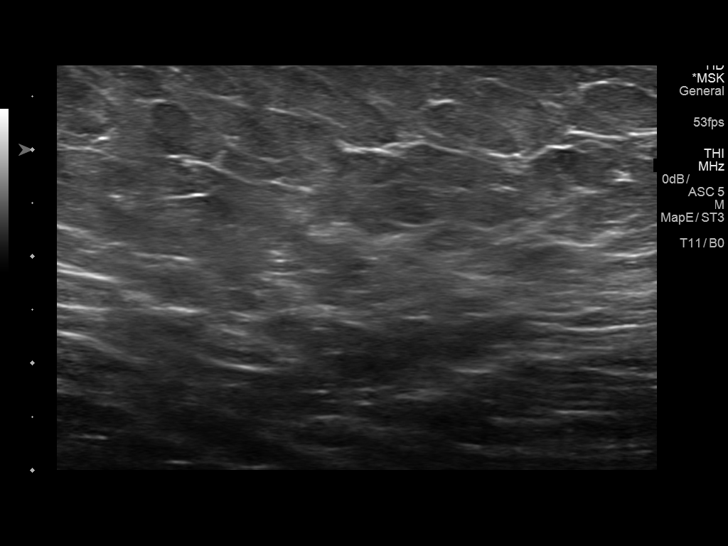
[im 14/26]
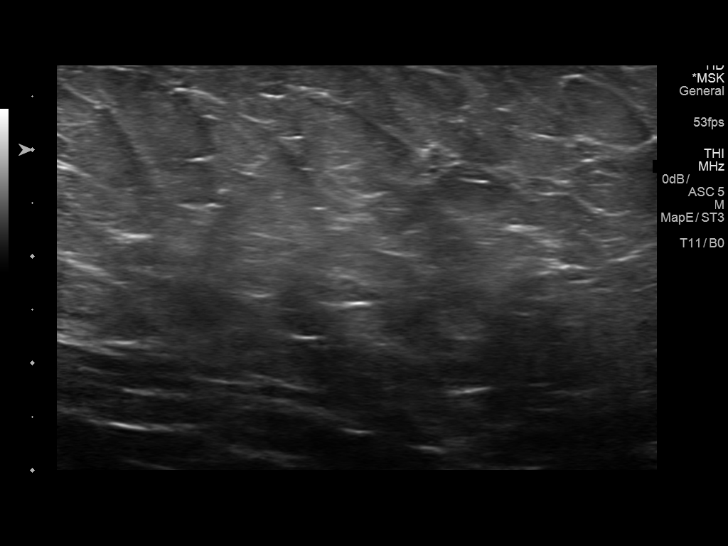
[im 16/26]
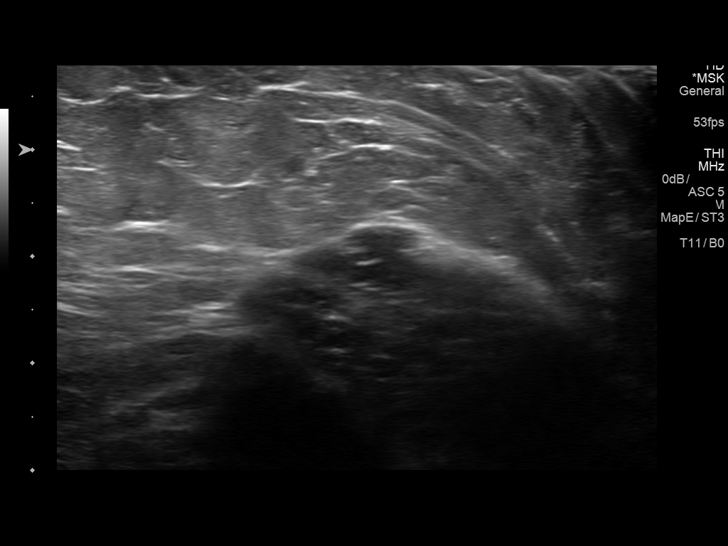
[im 17/26]
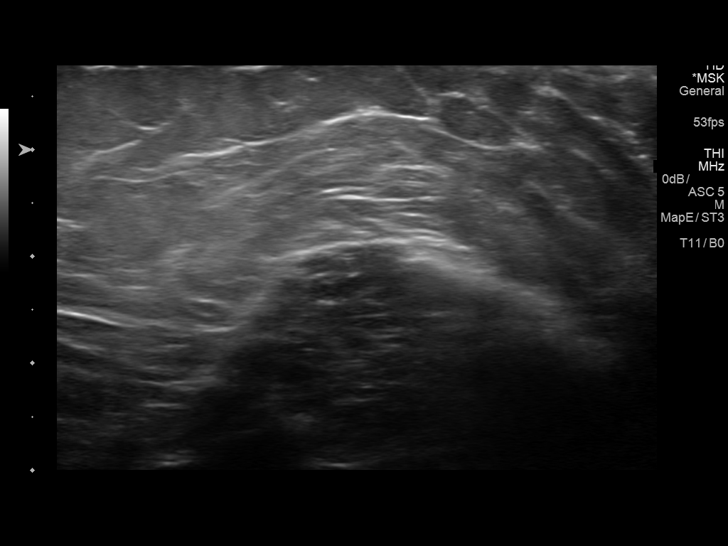
[im 21/26]
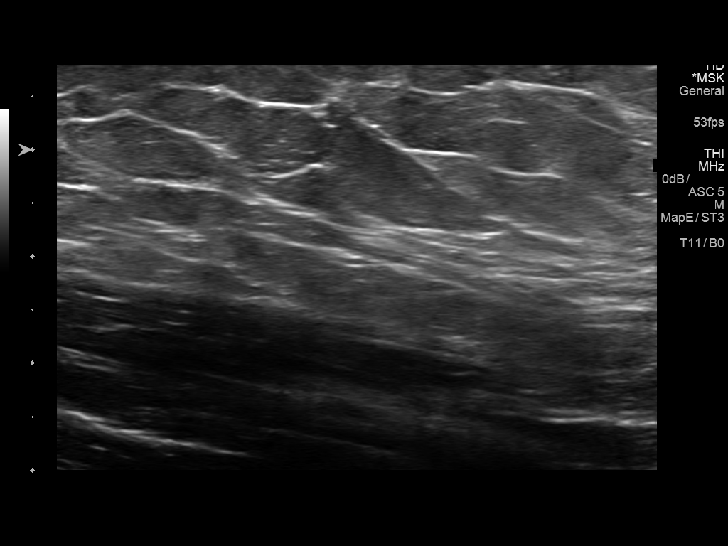
[im 22/26]
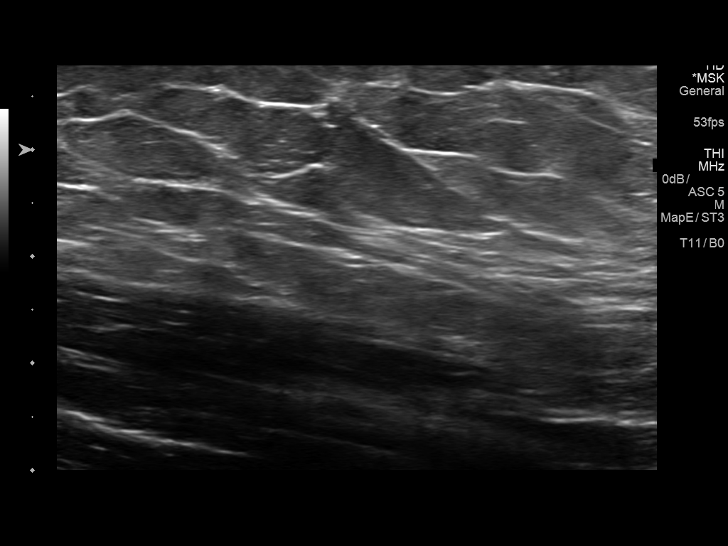
[im 24/26]
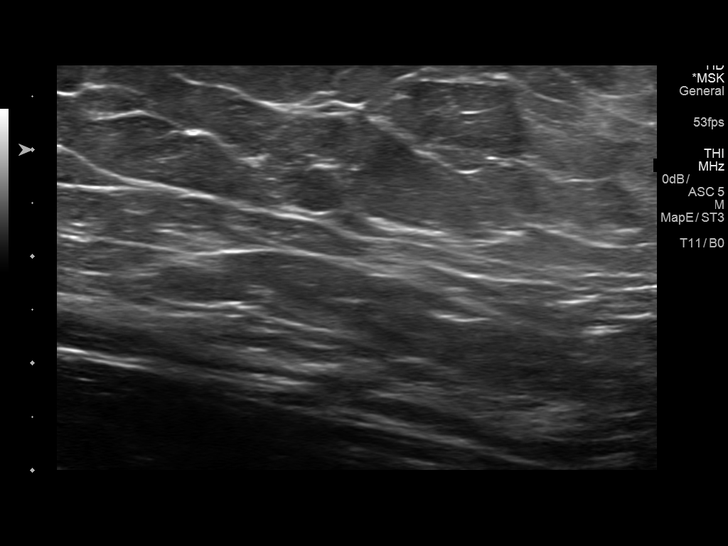
[im 26/26]
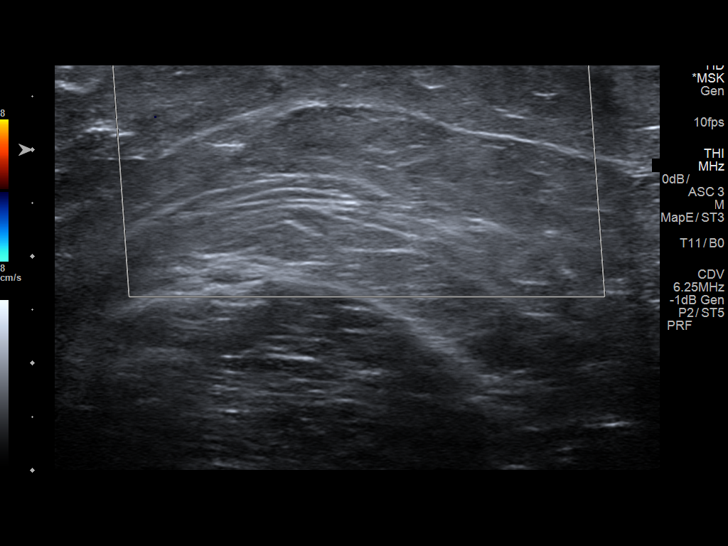

[14 of 16 positions shown; findings below may reference images not displayed]

FINDINGS: In evaluation of the area of symptoms reveals no cystic or solid
mass. The echotexture of the left axillary soft tissues normal. No
abnormalities were observed with Doppler interrogation.
IMPRESSION: No ultrasonic abnormality is observed. Further evaluation with MRI
may be useful if further imaging is desired and if the patient can
undergo the procedure.

## 2016-08-23 NOTE — Progress Notes (Signed)
HPI: FU bicuspid aortic valve. Previously followed at [facility] cardiology. Stress echocardiogram in March of 2013 showed normal LV function, probable bicuspid aortic valve and no stress-induced wall motion abnormalities. Monitor in March of 2013 showed sinus rhythm with PACs and short bursts of SVT but no sustained arrhythmias. Echocardiogram repeated January 2016. Normal LV function. Bicuspid aortic valve with no aortic stenosis or aortic insufficiency. Mildly dilated aorta. MRA February 2016 showed ectasia of ascending thoracic aorta at 37 mm. MRA of cerebral vasculature February 2016 showed no aneurysm. Since she was last seen she notes increased dyspnea on exertion but no orthopnea or PND. Minimal pedal edema. No chest pain or syncope. Occasional palpitations reasonably well controlled.  Current Outpatient Prescriptions  Medication Sig Dispense Refill  . metFORMIN (GLUCOPHAGE XR) 750 MG 24 hr tablet Take 1 tablet (750 mg total) by mouth daily with breakfast. 30 tablet 3  . metoprolol succinate (TOPROL XL) 25 MG 24 hr tablet Take 1 tablet (25 mg total) by mouth daily. 90 tablet 3  . Naltrexone-Bupropion HCl ER 8-90 MG TB12 1 tab daily for week 1, then 1 tab BID for week 2, then 2 tab PO qAM and 1 tab PO qPM for week 3, then 2 tabs BID. 120 tablet 0  . triamcinolone cream (KENALOG) 0.1 % Apply 381 application topically 2 (two) times daily. 454 g 11  . Vitamin D, Ergocalciferol, (DRISDOL) 50000 units CAPS capsule Take 1 capsule (50,000 Units total) by mouth every 7 (seven) days. Take for 8 total doses(weeks) 8 capsule 0   No current facility-administered medications for this visit.      Past Medical History:  Diagnosis Date  . Bicuspid aortic valve   . Breast cancer ([place])   . Kidney cyst, acquired   . Palpitations   . Polycythemia vera ([place])   . Thyroid nodule     Past Surgical History:  Procedure Laterality Date  . DILITATION & CURRETTAGE/HYSTROSCOPY WITH THERMACHOICE  ABLATION N/A 12/28/2012   Procedure: DILATATION & CURETTAGE/HYSTEROSCOPY WITH THERMACHOICE ABLATION;  Surgeon: Cyril Mourning, MD;  Location: [facility];  Service: Gynecology;  Laterality: N/A;  . MASTECTOMY     bilateral  . PARTIAL NEPHRECTOMY     RT   . TONSILLECTOMY    . TUBAL LIGATION      Social History   Social History  . Marital status: Married    Spouse name: N/A  . Number of children: 3  . Years of education: N/A   Occupational History  . RN [employer]   Social History Main Topics  . Smoking status: Never Smoker  . Smokeless tobacco: Never Used  . Alcohol use Yes     Comment: Occasional  . Drug use: No  . Sexual activity: Not on file   Other Topics Concern  . Not on file   Social History Narrative  . No narrative on file    Family History  Problem Relation Age of Onset  . Cancer Mother     lung  . COPD Father   . Congestive Heart Failure Father   . Stroke Maternal Grandfather     ROS: no fevers or chills, productive cough, hemoptysis, dysphasia, odynophagia, melena, hematochezia, dysuria, hematuria, rash, seizure activity, orthopnea, PND, pedal edema, claudication. Remaining systems are negative.  Physical Exam: Well-developed obese in no acute distress.  Skin is warm and dry.  HEENT is normal.  Neck is supple. No bruits Chest is clear to auscultation with normal expansion.  Cardiovascular exam is regular rate and rhythm.  Abdominal exam nontender or distended. No masses palpated. Extremities show no edema. neuro grossly intact  ECG- Sinus rhythm at a rate of 86. No ST changes;personally reviewed  A/P  1 Bicuspid aortic valve-Pt complains of increased DOE; plan to repeat echocardiogram.  2 History of thoracic aorta ectasia-in the setting of bicuspid aortic valve will plan to repeat MRA to make sure stable.  3 palpitations-continue beta blocker.  4 Hypertension-blood pressure controlled. Continue present medications. If additional medications  needed in the future would treat with ARB.  Kirk Ruths, MD

## 2016-08-25 ENCOUNTER — Encounter: Payer: Self-pay | Admitting: Cardiology

## 2016-08-25 ENCOUNTER — Ambulatory Visit (INDEPENDENT_AMBULATORY_CARE_PROVIDER_SITE_OTHER): Payer: 59 | Admitting: Cardiology

## 2016-08-25 VITALS — BP 138/78 | HR 86 | Ht 65.0 in | Wt 203.4 lb

## 2016-08-25 DIAGNOSIS — Q231 Congenital insufficiency of aortic valve: Secondary | ICD-10-CM

## 2016-08-25 DIAGNOSIS — I712 Thoracic aortic aneurysm, without rupture, unspecified: Secondary | ICD-10-CM

## 2016-08-25 NOTE — Patient Instructions (Signed)
Medication Instructions:   NO CHANGE  Testing/Procedures:  MRA OF CHEST TO FOLLOW UP THORACIC ANEURYSM  Your physician has requested that you have an echocardiogram. Echocardiography is a painless test that uses sound waves to create images of your heart. It provides your doctor with information about the size and shape of your heart and how well your heart's chambers and valves are working. This procedure takes approximately one hour. There are no restrictions for this procedure.    Follow-Up:  Your physician wants you to follow-up in: [place] will receive a reminder letter in the mail two months in advance. If you don't receive a letter, please call our office to schedule the follow-up appointment.   If you need a refill on your cardiac medications before your next appointment, please call your pharmacy.

## 2016-08-26 ENCOUNTER — Encounter: Payer: Self-pay | Admitting: Cardiology

## 2016-08-26 ENCOUNTER — Ambulatory Visit (HOSPITAL_COMMUNITY): Payer: 59 | Attending: Cardiology

## 2016-08-26 DIAGNOSIS — R002 Palpitations: Secondary | ICD-10-CM | POA: Diagnosis not present

## 2016-08-26 DIAGNOSIS — I712 Thoracic aortic aneurysm, without rupture: Secondary | ICD-10-CM | POA: Diagnosis not present

## 2016-08-26 DIAGNOSIS — E669 Obesity, unspecified: Secondary | ICD-10-CM | POA: Insufficient documentation

## 2016-08-26 DIAGNOSIS — Z853 Personal history of malignant neoplasm of breast: Secondary | ICD-10-CM | POA: Diagnosis not present

## 2016-08-26 DIAGNOSIS — I10 Essential (primary) hypertension: Secondary | ICD-10-CM | POA: Diagnosis not present

## 2016-08-26 DIAGNOSIS — Q231 Congenital insufficiency of aortic valve: Secondary | ICD-10-CM | POA: Diagnosis not present

## 2016-08-26 MED FILL — CONTRAVE ER 8-90 MG TABLET: 8-90 | 7 days supply | Qty: 14 | Fill #1

## 2016-08-31 ENCOUNTER — Ambulatory Visit (INDEPENDENT_AMBULATORY_CARE_PROVIDER_SITE_OTHER): Payer: 59 | Admitting: Sports Medicine

## 2016-08-31 ENCOUNTER — Encounter: Payer: Self-pay | Admitting: Sports Medicine

## 2016-08-31 DIAGNOSIS — E66811 Obesity, class 1: Secondary | ICD-10-CM

## 2016-08-31 DIAGNOSIS — E669 Obesity, unspecified: Secondary | ICD-10-CM | POA: Diagnosis not present

## 2016-08-31 MED ORDER — NALTREXONE-BUPROPION HCL ER 8-90 MG PO TB12: 2.0000 | ORAL_TABLET | Freq: Two times a day (BID) | ORAL | 0 refills | Status: DC

## 2016-08-31 NOTE — Progress Notes (Signed)
  Subjective:    CC: Follow-up  HPI: Obesity: 1 pound weight loss, medication is still not improved, see below. Has only been taking it for 2 weeks now.  Past medical history:  Negative.  See flowsheet/record as well for more information.  Surgical history: Negative.  See flowsheet/record as well for more information.  Family history: Negative.  See flowsheet/record as well for more information.  Social history: Negative.  See flowsheet/record as well for more information.  Allergies, and medications have been entered into the medical record, reviewed, and no changes needed.   Review of Systems: No fevers, chills, night sweats, weight loss, chest pain, or shortness of breath.   Objective:    General: Well Developed, well nourished, and in no acute distress.  Neuro: Alert and oriented x3, extra-ocular muscles intact, sensation grossly intact.  HEENT: Normocephalic, atraumatic, pupils equal round reactive to light, neck supple, no masses, no lymphadenopathy, thyroid nonpalpable.  Skin: Warm and dry, no rashes. Cardiac: Regular rate and rhythm, no murmurs rubs or gallops, no lower extremity edema.  Respiratory: Clear to auscultation bilaterally. Not using accessory muscles, speaking in full sentences.  Impression and Recommendations:    Obesity (BMI 30.0-34.9) One-pound weight loss after 2 weeks on contrast. This still didn't get covered, I just think that the insurance company does not have the correct information. She is currently doing an aggressive behavioral modification program regarding weight loss, she is also under a physician directed fitness program. She is motivated, she will be counting calories, this medication should be covered. Return in one month.

## 2016-08-31 NOTE — Assessment & Plan Note (Signed)
One-pound weight loss after 2 weeks on contrast. This still didn't get covered, I just think that the insurance company does not have the correct information. She is currently doing an aggressive behavioral modification program regarding weight loss, she is also under a physician directed fitness program. She is motivated, she will be counting calories, this medication should be covered. Return in one month.

## 2016-09-02 MED FILL — CONTRAVE ER 8-90 MG TABLET: 8-90 | 30 days supply | Qty: 120 | Fill #0

## 2016-09-07 MED FILL — METFORMIN HCL ER 750 MG TAB: 750 | 30 days supply | Qty: 30 | Fill #1

## 2016-09-08 DIAGNOSIS — E042 Nontoxic multinodular goiter: Secondary | ICD-10-CM | POA: Diagnosis not present

## 2016-09-10 ENCOUNTER — Other Ambulatory Visit: Payer: 59

## 2016-09-13 NOTE — Telephone Encounter (Signed)
Melissa Atkinson has been approved through [employer]. Pharmacy notified

## 2016-09-17 ENCOUNTER — Ambulatory Visit
Admission: RE | Admit: 2016-09-17 | Discharge: 2016-09-17 | Disposition: A | Discharge status: Home / Self Care | Payer: 59 | Source: Ambulatory Visit | Attending: Cardiology | Admitting: Cardiology

## 2016-09-17 DIAGNOSIS — I712 Thoracic aortic aneurysm, without rupture, unspecified: Secondary | ICD-10-CM

## 2016-09-17 MED ORDER — GADOBENATE DIMEGLUMINE 529 MG/ML IV SOLN: 15.0000 mL | Freq: Once | INTRAVENOUS | Status: DC | PRN

## 2016-09-22 ENCOUNTER — Other Ambulatory Visit: Payer: Self-pay | Admitting: Cardiology

## 2016-09-22 DIAGNOSIS — I359 Nonrheumatic aortic valve disorder, unspecified: Secondary | ICD-10-CM

## 2016-09-22 MED FILL — METOPROLOL SUCC ER 25 MG TA: 25 | 90 days supply | Qty: 90 | Fill #0

## 2016-09-28 ENCOUNTER — Ambulatory Visit: Payer: 59 | Admitting: Sports Medicine

## 2016-09-30 ENCOUNTER — Encounter: Payer: Self-pay | Admitting: Sports Medicine

## 2016-09-30 ENCOUNTER — Ambulatory Visit (INDEPENDENT_AMBULATORY_CARE_PROVIDER_SITE_OTHER): Payer: 59 | Admitting: Sports Medicine

## 2016-09-30 DIAGNOSIS — E66811 Obesity, class 1: Secondary | ICD-10-CM

## 2016-09-30 DIAGNOSIS — R635 Abnormal weight gain: Secondary | ICD-10-CM

## 2016-09-30 DIAGNOSIS — E669 Obesity, unspecified: Secondary | ICD-10-CM | POA: Diagnosis not present

## 2016-09-30 MED ORDER — METFORMIN HCL ER 750 MG PO TB24: 750.0000 mg | ORAL_TABLET | Freq: Every day | ORAL | 0 refills | Status: DC

## 2016-09-30 MED ORDER — NALTREXONE-BUPROPION HCL ER 8-90 MG PO TB12: 2.0000 | ORAL_TABLET | Freq: Two times a day (BID) | ORAL | 0 refills | Status: DC

## 2016-09-30 MED FILL — CONTRAVE ER 8-90 MG TABLET: 8-90 | 90 days supply | Qty: 360 | Fill #0

## 2016-09-30 NOTE — Assessment & Plan Note (Signed)
Additional 4 pound weight loss on Contrave with the addition of metformin. Because of cardiac valvulopathy she cannot take phentermine and she did not tolerate Saxenda.

## 2016-09-30 NOTE — Progress Notes (Signed)
  Subjective:    CC: Follow-up  HPI: Obesity: Good weight loss now on Contrave with the addition of metformin.  Past medical history:  Negative.  See flowsheet/record as well for more information.  Surgical history: Negative.  See flowsheet/record as well for more information.  Family history: Negative.  See flowsheet/record as well for more information.  Social history: Negative.  See flowsheet/record as well for more information.  Allergies, and medications have been entered into the medical record, reviewed, and no changes needed.   Review of Systems: No fevers, chills, night sweats, weight loss, chest pain, or shortness of breath.   Objective:    General: Well Developed, well nourished, and in no acute distress.  Neuro: Alert and oriented x3, extra-ocular muscles intact, sensation grossly intact.  HEENT: Normocephalic, atraumatic, pupils equal round reactive to light, neck supple, no masses, no lymphadenopathy, thyroid nonpalpable.  Skin: Warm and dry, no rashes. Cardiac: Regular rate and rhythm, no murmurs rubs or gallops, no lower extremity edema.  Respiratory: Clear to auscultation bilaterally. Not using accessory muscles, speaking in full sentences.  Impression and Recommendations:    Obesity Additional 4 pound weight loss on Contrave with the addition of metformin. Because of cardiac valvulopathy she cannot take phentermine and she did not tolerate Saxenda.  I spent 25 minutes with this patient, greater than 50% was face-to-face time counseling regarding the above diagnoses

## 2016-10-01 MED FILL — METFORMIN HCL ER 750 MG TAB: 750 | 90 days supply | Qty: 90 | Fill #0

## 2016-10-06 ENCOUNTER — Encounter: Payer: Self-pay | Admitting: Sports Medicine

## 2016-12-27 MED FILL — METOPROLOL SUCC ER 25 MG TA: 25 | 90 days supply | Qty: 90 | Fill #1

## 2016-12-31 ENCOUNTER — Ambulatory Visit: Payer: 59 | Admitting: Sports Medicine

## 2017-01-10 ENCOUNTER — Ambulatory Visit (INDEPENDENT_AMBULATORY_CARE_PROVIDER_SITE_OTHER): Payer: 59 | Admitting: Sports Medicine

## 2017-01-10 ENCOUNTER — Encounter: Payer: Self-pay | Admitting: Sports Medicine

## 2017-01-10 DIAGNOSIS — R635 Abnormal weight gain: Secondary | ICD-10-CM

## 2017-01-10 DIAGNOSIS — Z01419 Encounter for gynecological examination (general) (routine) without abnormal findings: Secondary | ICD-10-CM | POA: Diagnosis not present

## 2017-01-10 DIAGNOSIS — Z6832 Body mass index (BMI) 32.0-32.9, adult: Secondary | ICD-10-CM | POA: Diagnosis not present

## 2017-01-10 LAB — HM PAP SMEAR

## 2017-01-10 MED ORDER — BUPROPION HCL ER (XL) 150 MG PO TB24: ORAL_TABLET | ORAL | 3 refills | Status: DC

## 2017-01-10 MED FILL — BUPROPION HCL XL 150 MG TAB: 150 | 30 days supply | Qty: 60 | Fill #0

## 2017-01-10 NOTE — Assessment & Plan Note (Signed)
Unfortunately has not been able to lose any weight with Contrave. Kirke Shaggy was intolerable, and with her cardiac valvulopathy we are avoiding phentermine and phendimetrazine. I'm going to discontinue this and we will get her in with Dr. Jenne Campus nutrition, and switch her to plain Wellbutrin. She was also unable to tolerate the metformin. I also encouraged her to continue to cut back on her carbohydrate intake, as well as look into enrolling in Weight Watchers.

## 2017-01-10 NOTE — Progress Notes (Signed)
  Subjective:    CC: Follow-up  HPI: Obesity: Unfortunately his only lost a single pound. Understands we will be stopping the medication.  Past medical history:  Negative.  See flowsheet/record as well for more information.  Surgical history: Negative.  See flowsheet/record as well for more information.  Family history: Negative.  See flowsheet/record as well for more information.  Social history: Negative.  See flowsheet/record as well for more information.  Allergies, and medications have been entered into the medical record, reviewed, and no changes needed.   Review of Systems: No fevers, chills, night sweats, weight loss, chest pain, or shortness of breath.   Objective:    General: Well Developed, well nourished, and in no acute distress.  Neuro: Alert and oriented x3, extra-ocular muscles intact, sensation grossly intact.  HEENT: Normocephalic, atraumatic, pupils equal round reactive to light, neck supple, no masses, no lymphadenopathy, thyroid nonpalpable.  Skin: Warm and dry, no rashes. Cardiac: Regular rate and rhythm, no murmurs rubs or gallops, no lower extremity edema.  Respiratory: Clear to auscultation bilaterally. Not using accessory muscles, speaking in full sentences.  Impression and Recommendations:    Obesity Unfortunately has not been able to lose any weight with Contrave. Kirke Shaggy was intolerable, and with her cardiac valvulopathy we are avoiding phentermine and phendimetrazine. I'm going to discontinue this and we will get her in with Dr. Jenne Campus nutrition, and switch her to plain Wellbutrin. She was also unable to tolerate the metformin. I also encouraged her to continue to cut back on her carbohydrate intake, as well as look into enrolling in Weight Watchers.  I spent 25 minutes with this patient, greater than 50% was face-to-face time counseling regarding the above diagnoses

## 2017-01-28 ENCOUNTER — Encounter: Payer: Self-pay | Admitting: Sports Medicine

## 2017-02-14 MED FILL — buPROPion HCL ER (XL) 150 M: 150 | 30 days supply | Qty: 60 | Fill #1

## 2017-03-02 ENCOUNTER — Encounter: Payer: Self-pay | Admitting: Sports Medicine

## 2017-03-08 ENCOUNTER — Encounter (INDEPENDENT_AMBULATORY_CARE_PROVIDER_SITE_OTHER): Payer: Self-pay

## 2017-03-23 MED FILL — BUPROPION HCL XL 150 MG TAB: 150 | 30 days supply | Qty: 60 | Fill #2

## 2017-04-04 ENCOUNTER — Ambulatory Visit (INDEPENDENT_AMBULATORY_CARE_PROVIDER_SITE_OTHER): Payer: 59 | Admitting: Family Medicine

## 2017-04-04 ENCOUNTER — Encounter (INDEPENDENT_AMBULATORY_CARE_PROVIDER_SITE_OTHER): Payer: Self-pay | Admitting: Family Medicine

## 2017-04-04 VITALS — BP 112/78 | HR 63 | Temp 98.0°F | Ht 65.0 in | Wt 198.0 lb

## 2017-04-04 DIAGNOSIS — R5383 Other fatigue: Secondary | ICD-10-CM | POA: Diagnosis not present

## 2017-04-04 DIAGNOSIS — E669 Obesity, unspecified: Secondary | ICD-10-CM

## 2017-04-04 DIAGNOSIS — Z6833 Body mass index (BMI) 33.0-33.9, adult: Secondary | ICD-10-CM

## 2017-04-04 DIAGNOSIS — Z0289 Encounter for other administrative examinations: Secondary | ICD-10-CM

## 2017-04-04 DIAGNOSIS — E7849 Other hyperlipidemia: Secondary | ICD-10-CM

## 2017-04-04 DIAGNOSIS — R0602 Shortness of breath: Secondary | ICD-10-CM | POA: Diagnosis not present

## 2017-04-04 DIAGNOSIS — Z1331 Encounter for screening for depression: Secondary | ICD-10-CM

## 2017-04-04 DIAGNOSIS — Z9189 Other specified personal risk factors, not elsewhere classified: Secondary | ICD-10-CM | POA: Diagnosis not present

## 2017-04-04 DIAGNOSIS — Q231 Congenital insufficiency of aortic valve: Secondary | ICD-10-CM

## 2017-04-04 NOTE — Progress Notes (Signed)
Office: [phone]  /  Fax: [phone]   Dear Dr. Dianah Field,   Thank you for referring Melissa Atkinson to our clinic. The following note includes my evaluation and treatment recommendations.  HPI:   Chief Complaint: OBESITY    Melissa Atkinson has been referred by Raelyn Mora, MD for consultation regarding her obesity and obesity related comorbidities.    Melissa Atkinson (MR# [id number]) is a 46 y.o. female who presents on 04/04/2017 for obesity evaluation and treatment. Current BMI is Body mass index is 32.95 kg/m.Melissa Atkinson has been struggling with her weight for many years and has been unsuccessful in either losing weight, maintaining weight loss, or reaching her healthy weight goal.     Melissa Atkinson attended our information session and states she is currently in the action stage of change and ready to dedicate time achieving and maintaining a healthier weight. Taja is interested in becoming our patient and working on intensive lifestyle modifications including (but not limited to) diet, exercise and weight loss.    Melissa Atkinson states her family eats meals together she struggles with family and or coworkers weight loss sabotage her desired weight loss is 47 lbs she started gaining weight 2010ish her heaviest weight ever was 205 lbs. she has significant food cravings issues  she snacks frequently in the evenings she is frequently drinking liquids with calories she frequently makes poor food choices she frequently eats larger portions than normal  she has binge eating behaviors she struggles with emotional eating    Fatigue Melissa Atkinson feels her energy is lower than it should be. This has worsened with weight gain and has not worsened recently. Melissa Atkinson admits to daytime somnolence and  admits to waking up still tired. Patient is at risk for obstructive sleep apnea. Patent has a history of symptoms of daytime fatigue and morning fatigue. Patient generally gets 8 hours of sleep per night, and states  they generally have restful sleep. Snoring is present. Apneic episodes are not present. Epworth Sleepiness Score is 2  Dyspnea on exertion Javiana notes increasing shortness of breath with exercising and seems to be worsening over time with weight gain. She notes getting out of breath sooner with activity than she used to. This has not gotten worse recently. Melissa Atkinson denies orthopnea.  Bicuspid Aortic Valve Melissa Atkinson is currently on Metoprolol and blood pressure is stable. She denies chest pain or palpitations. Melissa Atkinson has mild edema bilateral lower extremities.  Hyperlipidemia Melissa Atkinson has hyperlipidemia and is not currently on a statin. Triglycerides are elevated but HDL is great at 112. She has been trying to improve her cholesterol levels with intensive lifestyle modification including a low saturated fat diet, exercise and weight loss. She denies any chest pain, claudication or myalgias.  At risk for cardiovascular disease Melissa Atkinson is at a higher than average risk for cardiovascular disease due to obesity and hyperlipidemia. She currently denies any chest pain.  Depression Screen Melissa Atkinson's Food and Mood (modified PHQ-9) score was  Depression screen PHQ 2/9 04/04/2017  Decreased Interest 2  Down, Depressed, Hopeless 3  PHQ - 2 Score 5  Altered sleeping 0  Tired, decreased energy 3  Change in appetite 1  Feeling bad or failure about yourself  3  Trouble concentrating 0  Moving slowly or fidgety/restless 0  Suicidal thoughts 0  PHQ-9 Score 12  Difficult doing work/chores Not difficult at all    ALLERGIES: No Known Allergies  MEDICATIONS: Current Outpatient Prescriptions on File Prior to Visit  Medication Sig Dispense Refill  .  buPROPion (WELLBUTRIN XL) 150 MG 24 hr tablet 1 tablet daily for a month then 1 tab twice a day 60 tablet 3  . metoprolol succinate (TOPROL-XL) 25 MG 24 hr tablet TAKE 1 TABLET (25 MG TOTAL) BY MOUTH DAILY. 90 tablet 3  . triamcinolone cream (KENALOG) 0.1 % Apply 703 application  topically 2 (two) times daily. 454 g 11   No current facility-administered medications on file prior to visit.     PAST MEDICAL HISTORY: Past Medical History:  Diagnosis Date  . Anxiety   . Back pain   . Bicuspid aortic valve   . Breast cancer ([place])   . Depression   . Joint pain   . Kidney cyst, acquired   . Obesity   . Palpitations   . Polycythemia vera ([place])   . Thoracic aortic aneurysm ([place])   . Thyroid nodule     PAST SURGICAL HISTORY: Past Surgical History:  Procedure Laterality Date  . DILITATION & CURRETTAGE/HYSTROSCOPY WITH THERMACHOICE ABLATION N/A 12/28/2012   Procedure: DILATATION & CURETTAGE/HYSTEROSCOPY WITH THERMACHOICE ABLATION;  Surgeon: Cyril Mourning, MD;  Location: [facility];  Service: Gynecology;  Laterality: N/A;  . MASTECTOMY     bilateral  . PARTIAL NEPHRECTOMY     RT   . TONSILLECTOMY    . TUBAL LIGATION      SOCIAL HISTORY: Social History  Substance Use Topics  . Smoking status: Never Smoker  . Smokeless tobacco: Never Used  . Alcohol use Yes     Comment: Occasional    FAMILY HISTORY: Family History  Problem Relation Age of Onset  . Cancer Mother        lung  . Depression Mother   . Anxiety disorder Mother   . Bipolar disorder Mother   . Alcoholism Mother   . COPD Father   . Congestive Heart Failure Father   . Depression Father   . Alcoholism Father   . Stroke Maternal Grandfather     ROS: Review of Systems  Constitutional: Positive for malaise/fatigue.  HENT: Positive for congestion (nasal stuffiness).   Respiratory: Positive for shortness of breath (with activity).   Cardiovascular: Negative for chest pain, palpitations, orthopnea and claudication.  Musculoskeletal: Positive for back pain. Negative for myalgias.       Muscle or Joint Pain Muscle Stiffness  Psychiatric/Behavioral: Positive for depression.       Stress     PHYSICAL EXAM: Blood pressure 112/78, pulse 63, temperature 98 F (36.7 C), temperature source  Oral, height 5\' 5"  (1.651 m), weight 198 lb (89.8 kg), SpO2 98 %. Body mass index is 32.95 kg/m. Physical Exam  Constitutional: She is oriented to person, place, and time. She appears well-developed and well-nourished.  Neck:  Slight diffusely enlarged  Pulmonary/Chest: Effort normal.  Abdominal: Soft.  Musculoskeletal: Normal range of motion. Edema: bilateral lower extremities.  Neurological: She is oriented to person, place, and time.  Skin: Skin is warm and dry.  Psychiatric: She has a normal mood and affect. Her behavior is normal.  Vitals reviewed.   RECENT LABS AND TESTS: BMET    Component Value Date/Time   NA 138 08/09/2016 1108   K 4.2 08/09/2016 1108   CL 102 08/09/2016 1108   CO2 27 08/09/2016 1108   GLUCOSE 89 08/09/2016 1108   BUN 12 08/09/2016 1108   CREATININE 0.79 08/09/2016 1108   CALCIUM 9.3 08/09/2016 1108   GFRNONAA >89 05/07/2013 1030   GFRAA >89 05/07/2013 1030   Lab Results  Component  Value Date   HGBA1C 5.0 08/09/2016   No results found for: INSULIN CBC    Component Value Date/Time   WBC 10.1 08/09/2016 1108   RBC 4.73 08/09/2016 1108   HGB 13.7 08/09/2016 1108   HCT 41.2 08/09/2016 1108   PLT 204 08/09/2016 1108   MCV 87.1 08/09/2016 1108   MCH 29.0 08/09/2016 1108   MCHC 33.3 08/09/2016 1108   RDW 13.4 08/09/2016 1108   Iron/TIBC/Ferritin/ %Sat No results found for: IRON, TIBC, FERRITIN, IRONPCTSAT Lipid Panel     Component Value Date/Time   CHOL 238 (H) 08/09/2016 1108   TRIG 167 (H) 08/09/2016 1108   HDL 112 08/09/2016 1108   CHOLHDL 2.1 08/09/2016 1108   VLDL 13 07/12/2014 0945   LDLCALC 92 07/12/2014 0945   Hepatic Function Panel     Component Value Date/Time   PROT 7.1 08/09/2016 1108   ALBUMIN 4.1 08/09/2016 1108   AST 15 08/09/2016 1108   ALT 12 08/09/2016 1108   ALKPHOS 76 08/09/2016 1108   BILITOT 0.5 08/09/2016 1108      Component Value Date/Time   TSH 1.50 08/09/2016 1108   TSH 1.308 02/07/2015 1010   TSH  1.013 07/12/2014 0945    ECG  shows NSR with a rate of 68 BPM INDIRECT CALORIMETER done today shows a VO2 of 215 and a REE of 1494.  Her calculated basal metabolic rate is 1443 thus her basal metabolic rate is worse than expected.    ASSESSMENT AND PLAN: Other fatigue - Plan: EKG 12-Lead, Comprehensive metabolic panel, CBC With Differential, Vitamin B12, Folate, Hemoglobin A1c, Insulin, random, T3, T4, free, TSH, VITAMIN D 25 Hydroxy (Vit-D Deficiency, Fractures)  Shortness of breath on exertion  Bicuspid aortic valve  Other hyperlipidemia - Plan: Lipid Panel With LDL/HDL Ratio  Depression screening  At risk for heart disease  Class 1 obesity with serious comorbidity and body mass index (BMI) of 33.0 to 33.9 in adult, unspecified obesity type  PLAN: Fatigue Melissa Atkinson was informed that her fatigue may be related to obesity, depression or many other causes. Labs will be ordered, and in the meanwhile Angelic has agreed to work on diet, exercise and weight loss to help with fatigue. Proper sleep hygiene was discussed including the need for 7-8 hours of quality sleep each night. A sleep study was not ordered based on symptoms and Epworth score.  Dyspnea on exertion Melissa Atkinson's shortness of breath appears to be obesity related and exercise induced. She has agreed to work on weight loss and gradually increase exercise to treat her exercise induced shortness of breath. If Melissa Atkinson follows our instructions and loses weight without improvement of her shortness of breath, we will plan to refer to pulmonology. We will monitor this condition regularly. Kyilee agrees to this plan.  Bicuspid Aortic Valve Melissa Atkinson will continue to work on diet, exercise and weight loss. We will continue to monitor blood pressure, as this is likely to decrease with weight loss and may need medication adjustment.  Hyperlipidemia Melissa Atkinson was informed of the American Heart Association Guidelines emphasizing intensive lifestyle modifications as  the first line treatment for hyperlipidemia. We discussed many lifestyle modifications today in depth, and Melissa Atkinson will continue to work on decreasing saturated fats such as fatty red meat, butter and many fried foods. She will also increase vegetables and lean protein in her diet and continue to work on exercise and weight loss efforts.  Cardiovascular risk counseling Melissa Atkinson was given extended (15 minutes) coronary artery disease prevention  counseling today. She is 46 y.o. female and has risk factors for heart disease including obesity and hyperlipidemia. We discussed intensive lifestyle modifications today with an emphasis on specific weight loss instructions and strategies. Pt was also informed of the importance of increasing exercise and decreasing saturated fats to help prevent heart disease.  Depression Screen Melissa Atkinson had a moderately positive depression screening. Depression is commonly associated with obesity and often results in emotional eating behaviors. We will monitor this closely and work on CBT to help improve the non-hunger eating patterns. Referral to Psychology may be required if no improvement is seen as she continues in our clinic.  Obesity Melissa Atkinson is currently in the action stage of change and her goal is to continue with weight loss efforts. I recommend Melissa Atkinson begin the structured treatment plan as follows:  She has agreed to follow the Category 2 plan Melissa Atkinson has been instructed to eventually work up to a goal of 150 minutes of combined cardio and strengthening exercise per week for weight loss and overall health benefits. We discussed the following Behavioral Modification Strategies today: increasing lean protein intake, decreasing simple carbohydrates  and work on meal planning and easy cooking plans   She was informed of the importance of frequent follow up visits to maximize her success with intensive lifestyle modifications for her multiple health conditions. She was informed we would  discuss her lab results at her next visit unless there is a critical issue that needs to be addressed sooner. Melissa Atkinson agreed to keep her next visit at the agreed upon time to discuss these results.  I, Doreene Nest, am acting as transcriptionist for  Dennard Nip, MD  I have reviewed the above documentation for accuracy and completeness, and I agree with the above. -Dennard Nip, MD    OBESITY BEHAVIORAL INTERVENTION VISIT  Today's visit was # 1 out of 20.  Starting weight: 198 lbs Starting date: 04/04/17 Today's weight : 198 lbs Today's date: 04/04/2017 Total lbs lost to date: 0 (Patients must lose 7 lbs in the first 6 months to continue with counseling)   ASK: We discussed the diagnosis of obesity with Melissa Atkinson today and Vonette agreed to give [place] permission to discuss obesity behavioral modification therapy today.  ASSESS: Jacqulyn has the diagnosis of obesity and her BMI today is 32.95 Kemari is in the action stage of change   ADVISE: Kaedance was educated on the multiple health risks of obesity as well as the benefit of weight loss to improve her health. She was advised of the need for long term treatment and the importance of lifestyle modifications.  AGREE: Multiple dietary modification options and treatment options were discussed and  Jarely agreed to follow the Category 2 plan We discussed the following Behavioral Modification Strategies today: increasing lean protein intake, decreasing simple carbohydrates  and work on meal planning and easy cooking plans

## 2017-04-05 LAB — CBC WITH DIFFERENTIAL
Basophils Absolute: 0.1 10*3/uL (ref 0.0–0.2)
Basos: 1 %
EOS (ABSOLUTE): 0.4 10*3/uL (ref 0.0–0.4)
Eos: 6 %
Hematocrit: 39.5 % (ref 34.0–46.6)
Hemoglobin: 13.1 g/dL (ref 11.1–15.9)
Immature Grans (Abs): 0.1 10*3/uL (ref 0.0–0.1)
Immature Granulocytes: 1 %
Lymphocytes Absolute: 1.7 10*3/uL (ref 0.7–3.1)
Lymphs: 21 %
MCH: 28.7 pg (ref 26.6–33.0)
MCHC: 33.2 g/dL (ref 31.5–35.7)
MCV: 86 fL (ref 79–97)
Monocytes Absolute: 0.5 10*3/uL (ref 0.1–0.9)
Monocytes: 7 %
Neutrophils Absolute: 5 10*3/uL (ref 1.4–7.0)
Neutrophils: 64 %
RBC: 4.57 x10E6/uL (ref 3.77–5.28)
RDW: 13.9 % (ref 12.3–15.4)
WBC: 7.7 10*3/uL (ref 3.4–10.8)

## 2017-04-05 LAB — LIPID PANEL WITH LDL/HDL RATIO
Cholesterol, Total: 219 mg/dL — ABNORMAL HIGH (ref 100–199)
HDL: 86 mg/dL (ref >39)
LDL Calculated: 101 mg/dL — ABNORMAL HIGH (ref 0–99)
LDl/HDL Ratio: 1.2 ratio (ref 0.0–3.2)
Triglycerides: 161 mg/dL — ABNORMAL HIGH (ref 0–149)
VLDL Cholesterol Cal: 32 mg/dL (ref 5–40)

## 2017-04-05 LAB — TSH: TSH: 0.926 u[IU]/mL (ref 0.450–4.500)

## 2017-04-05 LAB — COMPREHENSIVE METABOLIC PANEL
ALT: 12 IU/L (ref 0–32)
AST: 13 IU/L (ref 0–40)
Albumin/Globulin Ratio: 1.7 (ref 1.2–2.2)
Albumin: 4.2 g/dL (ref 3.5–5.5)
Alkaline Phosphatase: 86 IU/L (ref 39–117)
BUN/Creatinine Ratio: 12 (ref 9–23)
BUN: 10 mg/dL (ref 6–24)
Bilirubin Total: 0.4 mg/dL (ref 0.0–1.2)
CO2: 24 mmol/L (ref 20–29)
Calcium: 9.4 mg/dL (ref 8.7–10.2)
Chloride: 100 mmol/L (ref 96–106)
Creatinine, Ser: 0.82 mg/dL (ref 0.57–1.00)
GFR calc Af Amer: 99 mL/min/{1.73_m2} (ref >59)
GFR calc non Af Amer: 85 mL/min/{1.73_m2} (ref >59)
Globulin, Total: 2.5 g/dL (ref 1.5–4.5)
Glucose: 88 mg/dL (ref 65–99)
Potassium: 4.2 mmol/L (ref 3.5–5.2)
Sodium: 140 mmol/L (ref 134–144)
Total Protein: 6.7 g/dL (ref 6.0–8.5)

## 2017-04-05 LAB — T4, FREE: Free T4: 1.16 ng/dL (ref 0.82–1.77)

## 2017-04-05 LAB — VITAMIN D 25 HYDROXY (VIT D DEFICIENCY, FRACTURES): Vit D, 25-Hydroxy: 19.2 ng/mL — ABNORMAL LOW (ref 30.0–100.0)

## 2017-04-05 LAB — FOLATE: Folate: 10.4 ng/mL (ref >3.0)

## 2017-04-05 LAB — HEMOGLOBIN A1C
Est. average glucose Bld gHb Est-mCnc: 103 mg/dL
Hgb A1c MFr Bld: 5.2 % (ref 4.8–5.6)

## 2017-04-05 LAB — INSULIN, RANDOM: INSULIN: 9.1 u[IU]/mL (ref 2.6–24.9)

## 2017-04-05 LAB — VITAMIN B12: Vitamin B-12: 381 pg/mL (ref 232–1245)

## 2017-04-05 LAB — T3: T3, Total: 114 ng/dL (ref 71–180)

## 2017-04-12 ENCOUNTER — Ambulatory Visit: Payer: 59 | Admitting: Sports Medicine

## 2017-04-13 MED FILL — METOPROLOL SUCC ER 25 MG TA: 25 | 90 days supply | Qty: 90 | Fill #2

## 2017-04-18 ENCOUNTER — Ambulatory Visit (INDEPENDENT_AMBULATORY_CARE_PROVIDER_SITE_OTHER): Payer: 59 | Admitting: Family Medicine

## 2017-04-19 ENCOUNTER — Ambulatory Visit (INDEPENDENT_AMBULATORY_CARE_PROVIDER_SITE_OTHER): Payer: 59 | Admitting: Family Medicine

## 2017-04-19 VITALS — BP 113/82 | HR 79 | Temp 98.1°F | Ht 65.0 in | Wt 192.0 lb

## 2017-04-19 DIAGNOSIS — F3289 Other specified depressive episodes: Secondary | ICD-10-CM | POA: Diagnosis not present

## 2017-04-19 DIAGNOSIS — E8881 Metabolic syndrome: Secondary | ICD-10-CM

## 2017-04-19 DIAGNOSIS — E669 Obesity, unspecified: Secondary | ICD-10-CM

## 2017-04-19 DIAGNOSIS — E559 Vitamin D deficiency, unspecified: Secondary | ICD-10-CM | POA: Diagnosis not present

## 2017-04-19 DIAGNOSIS — Z6832 Body mass index (BMI) 32.0-32.9, adult: Secondary | ICD-10-CM | POA: Diagnosis not present

## 2017-04-19 DIAGNOSIS — Z9189 Other specified personal risk factors, not elsewhere classified: Secondary | ICD-10-CM

## 2017-04-19 MED ORDER — BUPROPION HCL ER (SR) 150 MG PO TB12: 150.0000 mg | ORAL_TABLET | Freq: Two times a day (BID) | ORAL | 0 refills | Status: DC

## 2017-04-19 MED ORDER — VITAMIN D (ERGOCALCIFEROL) 1.25 MG (50000 UNIT) PO CAPS: 50000.0000 [IU] | ORAL_CAPSULE | ORAL | 0 refills | Status: DC

## 2017-04-19 MED FILL — VIT D2 1.25 MG (50,000 UNIT: 1.25 MG | 28 days supply | Qty: 4 | Fill #0

## 2017-04-19 MED FILL — BUPROPION SR 150 MG TABLET: 150 | 30 days supply | Qty: 60 | Fill #0

## 2017-04-20 NOTE — Progress Notes (Signed)
Office: [phone]  /  Fax: [phone]   HPI:   Chief Complaint: OBESITY Melissa Atkinson is here to discuss her progress with her obesity treatment plan. She is on the Category 2 plan and is following her eating plan approximately 93 % of the time. She states she is exercising 0 minutes 0 times per week. Melissa Atkinson did well with weight loss on Category 2 plan but struggled with cravings for Doritos and felt deprived and missed other foods.  Her weight is 192 lb (87.1 kg) today and has had a weight loss of 6 pounds over a period of 2 weeks since her last visit. She has lost 6 lbs since starting treatment with [place].  Vitamin D deficiency Melissa Atkinson has a new diagnosis of vitamin D deficiency. She is not currently taking vit D and she notes fatigue but denies nausea, vomiting or muscle weakness.  Insulin Resistance Melissa Atkinson has a diagnosis of insulin resistance based on her elevated fasting insulin level >5. Her A1c and glucose are normal. Although Melissa Atkinson's blood glucose readings are still under good control, insulin resistance puts her at greater risk of metabolic syndrome and diabetes. She has been on metformin in the past but she worried about hypoglycemia. She is not currently on metformin and continues to work on diet and exercise to decrease risk of diabetes.  At risk for diabetes Melissa Atkinson is at higher than average risk for developing diabetes due to her obesity and insulin resistance. She currently denies polyuria or polydipsia.  Depression with emotional eating behaviors Melissa Atkinson is struggling with emotional eating and using food for comfort to the extent that it is negatively impacting her health. She often snacks when she is not hungry. Melissa Atkinson sometimes feels she is out of control and then feels guilty that she made poor food choices. She has been working on behavior modification techniques to help reduce her emotional eating and has been somewhat successful. Melissa Atkinson is on Wellbutrin XL BID and feels it helps with her mood  and her energy but it isn't helping with emotional eating. She shows no sign of suicidal or homicidal ideations.  Depression screen PHQ 2/9 04/04/2017  Decreased Interest 2  Down, Depressed, Hopeless 3  PHQ - 2 Score 5  Altered sleeping 0  Tired, decreased energy 3  Change in appetite 1  Feeling bad or failure about yourself  3  Trouble concentrating 0  Moving slowly or fidgety/restless 0  Suicidal thoughts 0  PHQ-9 Score 12  Difficult doing work/chores Not difficult at all   ALLERGIES: No Known Allergies  MEDICATIONS: Current Outpatient Prescriptions on File Prior to Visit  Medication Sig Dispense Refill  . metoprolol succinate (TOPROL-XL) 25 MG 24 hr tablet TAKE 1 TABLET (25 MG TOTAL) BY MOUTH DAILY. 90 tablet 3  . triamcinolone cream (KENALOG) 0.1 % Apply 629 application topically 2 (two) times daily. 454 g 11   No current facility-administered medications on file prior to visit.     PAST MEDICAL HISTORY: Past Medical History:  Diagnosis Date  . Anxiety   . Back pain   . Bicuspid aortic valve   . Breast cancer ([place])   . Depression   . Joint pain   . Kidney cyst, acquired   . Obesity   . Palpitations   . Polycythemia vera ([place])   . Thoracic aortic aneurysm ([place])   . Thyroid nodule     PAST SURGICAL HISTORY: Past Surgical History:  Procedure Laterality Date  . DILITATION & CURRETTAGE/HYSTROSCOPY WITH THERMACHOICE ABLATION N/A  12/28/2012   Procedure: DILATATION & CURETTAGE/HYSTEROSCOPY WITH THERMACHOICE ABLATION;  Surgeon: Cyril Mourning, MD;  Location: [facility];  Service: Gynecology;  Laterality: N/A;  . MASTECTOMY     bilateral  . PARTIAL NEPHRECTOMY     RT   . TONSILLECTOMY    . TUBAL LIGATION      SOCIAL HISTORY: Social History  Substance Use Topics  . Smoking status: Never Smoker  . Smokeless tobacco: Never Used  . Alcohol use Yes     Comment: Occasional    FAMILY HISTORY: Family History  Problem Relation Age of Onset  . Cancer Mother         lung  . Depression Mother   . Anxiety disorder Mother   . Bipolar disorder Mother   . Alcoholism Mother   . COPD Father   . Congestive Heart Failure Father   . Depression Father   . Alcoholism Father   . Stroke Maternal Grandfather     ROS: Review of Systems  Constitutional: Positive for malaise/fatigue and weight loss.  Gastrointestinal: Negative for nausea and vomiting.  Genitourinary: Negative for frequency.  Musculoskeletal:       Negative muscle weakness  Endo/Heme/Allergies: Negative for polydipsia.  Psychiatric/Behavioral: Positive for depression. Negative for suicidal ideas.    PHYSICAL EXAM: Blood pressure 113/82, pulse 79, temperature 98.1 F (36.7 C), temperature source Oral, height 5\' 5"  (1.651 m), weight 192 lb (87.1 kg), last menstrual period 04/06/2017, SpO2 99 %. Body mass index is 31.95 kg/m. Physical Exam  Constitutional: She is oriented to person, place, and time. She appears well-developed and well-nourished.  Cardiovascular: Normal rate.   Pulmonary/Chest: Effort normal.  Musculoskeletal: Normal range of motion.  Neurological: She is oriented to person, place, and time.  Skin: Skin is warm and dry.  Psychiatric: She has a normal mood and affect.  Vitals reviewed.   RECENT LABS AND TESTS: BMET    Component Value Date/Time   NA 140 04/04/2017 0935   K 4.2 04/04/2017 0935   CL 100 04/04/2017 0935   CO2 24 04/04/2017 0935   GLUCOSE 88 04/04/2017 0935   GLUCOSE 89 08/09/2016 1108   BUN 10 04/04/2017 0935   CREATININE 0.82 04/04/2017 0935   CREATININE 0.79 08/09/2016 1108   CALCIUM 9.4 04/04/2017 0935   GFRNONAA 85 04/04/2017 0935   GFRNONAA >89 05/07/2013 1030   GFRAA 99 04/04/2017 0935   GFRAA >89 05/07/2013 1030   Lab Results  Component Value Date   HGBA1C 5.2 04/04/2017   HGBA1C 5.0 08/09/2016   HGBA1C 5.2 07/12/2014   Lab Results  Component Value Date   INSULIN 9.1 04/04/2017   CBC    Component Value Date/Time   WBC 7.7  04/04/2017 0935   WBC 10.1 08/09/2016 1108   RBC 4.57 04/04/2017 0935   RBC 4.73 08/09/2016 1108   HGB 13.1 04/04/2017 0935   HCT 39.5 04/04/2017 0935   PLT 204 08/09/2016 1108   MCV 86 04/04/2017 0935   MCH 28.7 04/04/2017 0935   MCH 29.0 08/09/2016 1108   MCHC 33.2 04/04/2017 0935   MCHC 33.3 08/09/2016 1108   RDW 13.9 04/04/2017 0935   LYMPHSABS 1.7 04/04/2017 0935   EOSABS 0.4 04/04/2017 0935   BASOSABS 0.1 04/04/2017 0935   Iron/TIBC/Ferritin/ %Sat No results found for: IRON, TIBC, FERRITIN, IRONPCTSAT Lipid Panel     Component Value Date/Time   CHOL 219 (H) 04/04/2017 0935   TRIG 161 (H) 04/04/2017 0935   HDL 86 04/04/2017 0935  CHOLHDL 2.1 08/09/2016 1108   VLDL 13 07/12/2014 0945   LDLCALC 101 (H) 04/04/2017 0935   Hepatic Function Panel     Component Value Date/Time   PROT 6.7 04/04/2017 0935   ALBUMIN 4.2 04/04/2017 0935   AST 13 04/04/2017 0935   ALT 12 04/04/2017 0935   ALKPHOS 86 04/04/2017 0935   BILITOT 0.4 04/04/2017 0935      Component Value Date/Time   TSH 0.926 04/04/2017 0935   TSH 1.50 08/09/2016 1108   TSH 1.308 02/07/2015 1010    ASSESSMENT AND PLAN: Vitamin D deficiency - Plan: Vitamin D, Ergocalciferol, (DRISDOL) 50000 units CAPS capsule  Insulin resistance - Plan: buPROPion (WELLBUTRIN SR) 150 MG 12 hr tablet  Other depression - with emotional eating  At risk for diabetes mellitus  Class 1 obesity with serious comorbidity and body mass index (BMI) of 32.0 to 32.9 in adult, unspecified obesity type  PLAN:  Vitamin D Deficiency Melissa Atkinson was informed that low vitamin D levels contributes to fatigue and are associated with obesity, breast, and colon cancer. Melissa Atkinson agrees to start prescription Vit D @50 ,000 IU every week #4 with no refills. She will follow up for routine testing of vitamin D, at least 2-3 times per year. She was informed of the risk of over-replacement of vitamin D and agrees to not increase her dose unless he discusses this  with [place] first. We will recheck labs in 2 months and Melissa Atkinson agrees to follow up with our clinic in 2 weeks.  Insulin Resistance Melissa Atkinson will continue to work on weight loss, diet, exercise, and decreasing simple carbohydrates in her diet to help decrease the risk of diabetes. We dicussed metformin including benefits and risks. She was informed that eating too many simple carbohydrates or too many calories at one sitting increases the likelihood of GI side effects. Melissa Atkinson declined metformin for now and prescription was not written today. We will recheck labs in 2 months and  Melissa Atkinson agrees to follow up with our clinic in 2 weeks as directed to monitor her progress.  Diabetes risk counselling Melissa Atkinson was given extended (30 minutes) diabetes prevention counseling today. She is 46 y.o. female and has risk factors for diabetes including obesity and insulin resistance. We discussed intensive lifestyle modifications today with an emphasis on weight loss as well as increasing exercise and decreasing simple carbohydrates in her diet.  Depression with Emotional Eating Behaviors We discussed behavior modification techniques today to help Melissa Atkinson deal with her emotional eating and depression. She has agrees to discontinue Wellbutrin XL. Melissa Atkinson agrees to start Wellbutrin SR 150 mg BID #60 with no refills. She agrees to follow up with our clinic in 2 weeks.  Obesity Melissa Atkinson is currently in the action stage of change. As such, her goal is to continue with weight loss efforts She has agreed to follow the Category 2 plan Melissa Atkinson has been instructed to work up to a goal of 150 minutes of combined cardio and strengthening exercise per week for weight loss and overall health benefits. We discussed the following Behavioral Modification Strategies today: increasing lean protein intake, decreasing simple carbohydrates, work on meal planning and easy cooking plans, and no skipping meals   Melissa Atkinson has agreed to follow up with our clinic in 2 weeks.  She was informed of the importance of frequent follow up visits to maximize her success with intensive lifestyle modifications for her multiple health conditions.  Melissa Atkinson, am acting as transcriptionist for Dennard Nip, MD  I have  reviewed the above documentation for accuracy and completeness, and I agree with the above. -Dennard Nip, MD     Today's visit was # 2 out of 22.  Starting weight: 198 lbs Starting date: 04/04/17 Today's weight : 192 lbs  Today's date: 04/19/2017 Total lbs lost to date: 6 (Patients must lose 7 lbs in the first 6 months to continue with counseling)   ASK: We discussed the diagnosis of obesity with Melissa Atkinson today and Melissa Atkinson agreed to give [place] permission to discuss obesity behavioral modification therapy today.  ASSESS: Melissa Atkinson has the diagnosis of obesity and her BMI today is 44 Melissa Atkinson is in the action stage of change   ADVISE: Melissa Atkinson was educated on the multiple health risks of obesity as well as the benefit of weight loss to improve her health. She was advised of the need for long term treatment and the importance of lifestyle modifications.  AGREE: Multiple dietary modification options and treatment options were discussed and  Melissa Atkinson agreed to follow the Category 2 plan We discussed the following Behavioral Modification Strategies today: increasing lean protein intake, decreasing simple carbohydrates, work on meal planning and easy cooking plans, and no skipping meals

## 2017-05-03 ENCOUNTER — Ambulatory Visit (INDEPENDENT_AMBULATORY_CARE_PROVIDER_SITE_OTHER): Payer: 59 | Admitting: Family Medicine

## 2017-05-03 VITALS — BP 115/80 | HR 80 | Temp 98.2°F | Ht 65.0 in | Wt 194.0 lb

## 2017-05-03 DIAGNOSIS — F329 Major depressive disorder, single episode, unspecified: Secondary | ICD-10-CM | POA: Insufficient documentation

## 2017-05-03 DIAGNOSIS — I10 Essential (primary) hypertension: Secondary | ICD-10-CM

## 2017-05-03 DIAGNOSIS — E669 Obesity, unspecified: Secondary | ICD-10-CM

## 2017-05-03 DIAGNOSIS — Z6832 Body mass index (BMI) 32.0-32.9, adult: Secondary | ICD-10-CM

## 2017-05-03 DIAGNOSIS — F32A Depression, unspecified: Secondary | ICD-10-CM | POA: Insufficient documentation

## 2017-05-03 DIAGNOSIS — F3289 Other specified depressive episodes: Secondary | ICD-10-CM | POA: Diagnosis not present

## 2017-05-03 DIAGNOSIS — Z9189 Other specified personal risk factors, not elsewhere classified: Secondary | ICD-10-CM

## 2017-05-03 NOTE — Progress Notes (Signed)
Office: [phone]  /  Fax: [phone]   HPI:   Chief Complaint: OBESITY Melissa Atkinson is here to discuss her progress with her obesity treatment plan. She is on the Category 2 plan and is following her eating plan approximately 90 % of the time. She states she is exercising 0 minutes 0 times per week. Melissa Atkinson has done well on her Category 2 plan except for last weekend when she over indulged. She is ready to get back on track and overall happy with her Category 2 plan. Her weight is 194 lb (88 kg) today and has gained 2 pounds since her last visit. She has lost 4 lbs since starting treatment with Melissa Atkinson.  Hypertension Melissa Atkinson is a 46 y.o. female with hypertension. She is stable on metoprolol, no increase with change in Wellbutrin. Melissa Atkinson denies chest pain or shortness of breath. She is working weight loss to help control her blood Atkinson with the goal of decreasing her risk of heart attack and stroke. Melissa Atkinson is stable.  At risk for cardiovascular disease Melissa Atkinson is at a higher than average risk for cardiovascular disease due to obesity and hypertension. She currently denies any chest pain.  Depression with emotional eating behaviors Melissa Atkinson changed Wellbutrin XL to Wellbutrin SR 150 mg BID and she doing well with emotional eating meal planning. She took her second dose late the first night and had insomnia, but not on other nights. She often snacks when she is not hungry. Melissa Atkinson sometimes feels she is out of control and then feels guilty that she made poor food choices. She has been working on behavior modification techniques to help reduce her emotional eating and has been somewhat successful. She shows no sign of suicidal or homicidal ideations.  Depression screen PHQ 2/9 04/04/2017  Decreased Interest 2  Down, Depressed, Hopeless 3  PHQ - 2 Score 5  Altered sleeping 0  Tired, decreased energy 3  Change in appetite 1  Feeling bad or failure about yourself  3  Trouble  concentrating 0  Moving slowly or fidgety/restless 0  Suicidal thoughts 0  PHQ-9 Score 12  Difficult doing work/chores Not difficult at all   ALLERGIES: No Known Allergies  MEDICATIONS: Current Outpatient Medications on File Prior to Visit  Medication Sig Dispense Refill  . buPROPion (WELLBUTRIN SR) 150 MG 12 hr tablet Take 1 tablet (150 mg total) by mouth 2 (two) times daily. 60 tablet 0  . metoprolol succinate (TOPROL-XL) 25 MG 24 hr tablet TAKE 1 TABLET (25 MG TOTAL) BY MOUTH DAILY. 90 tablet 3  . triamcinolone cream (KENALOG) 0.1 % Apply 952 application topically 2 (two) times daily. 454 g 11  . Vitamin D, Ergocalciferol, (DRISDOL) 50000 units CAPS capsule Take 1 capsule (50,000 Units total) by mouth every 7 (seven) days. 4 capsule 0   No current facility-administered medications on file prior to visit.     PAST MEDICAL HISTORY: Past Medical History:  Diagnosis Date  . Anxiety   . Back pain   . Bicuspid aortic valve   . Breast cancer ([place])   . Depression   . Joint pain   . Kidney cyst, acquired   . Obesity   . Palpitations   . Polycythemia vera ([place])   . Thoracic aortic aneurysm ([place])   . Thyroid nodule     PAST SURGICAL HISTORY: Past Surgical History:  Procedure Laterality Date  . MASTECTOMY     bilateral  . PARTIAL NEPHRECTOMY  RT   . TONSILLECTOMY    . TUBAL LIGATION      SOCIAL HISTORY: Social History   Tobacco Use  . Smoking status: Never Smoker  . Smokeless tobacco: Never Used  Substance Use Topics  . Alcohol use: Yes    Comment: Occasional  . Drug use: No    FAMILY HISTORY: Family History  Problem Relation Age of Onset  . Cancer Mother        lung  . Depression Mother   . Anxiety disorder Mother   . Bipolar disorder Mother   . Alcoholism Mother   . COPD Father   . Congestive Heart Failure Father   . Depression Father   . Alcoholism Father   . Stroke Maternal Grandfather     ROS: Review of Systems  Constitutional: Negative for  weight loss.  Respiratory: Negative for shortness of breath.   Cardiovascular: Negative for chest pain.  Psychiatric/Behavioral: Positive for depression. Negative for suicidal ideas.    PHYSICAL EXAM: Blood Atkinson 115/80, pulse 80, temperature 98.2 F (36.8 C), temperature source Oral, height 5\' 5"  (1.651 m), weight 194 lb (88 kg), last menstrual period 04/06/2017, SpO2 99 %. Body mass index is 32.28 kg/m. Physical Exam  Constitutional: She is oriented to person, place, and time. She appears well-developed and well-nourished.  Cardiovascular: Normal rate.  Pulmonary/Chest: Effort normal.  Musculoskeletal: Normal range of motion.  Neurological: She is oriented to person, place, and time.  Skin: Skin is warm and dry.  Psychiatric: She has a normal mood and affect.  Vitals reviewed.   RECENT LABS AND TESTS: BMET    Component Value Date/Time   NA 140 04/04/2017 0935   K 4.2 04/04/2017 0935   CL 100 04/04/2017 0935   CO2 24 04/04/2017 0935   GLUCOSE 88 04/04/2017 0935   GLUCOSE 89 08/09/2016 1108   BUN 10 04/04/2017 0935   CREATININE 0.82 04/04/2017 0935   CREATININE 0.79 08/09/2016 1108   CALCIUM 9.4 04/04/2017 0935   GFRNONAA 85 04/04/2017 0935   GFRNONAA >89 05/07/2013 1030   GFRAA 99 04/04/2017 0935   GFRAA >89 05/07/2013 1030   Lab Results  Component Value Date   HGBA1C 5.2 04/04/2017   HGBA1C 5.0 08/09/2016   HGBA1C 5.2 07/12/2014   Lab Results  Component Value Date   INSULIN 9.1 04/04/2017   CBC    Component Value Date/Time   WBC 7.7 04/04/2017 0935   WBC 10.1 08/09/2016 1108   RBC 4.57 04/04/2017 0935   RBC 4.73 08/09/2016 1108   HGB 13.1 04/04/2017 0935   HCT 39.5 04/04/2017 0935   PLT 204 08/09/2016 1108   MCV 86 04/04/2017 0935   MCH 28.7 04/04/2017 0935   MCH 29.0 08/09/2016 1108   MCHC 33.2 04/04/2017 0935   MCHC 33.3 08/09/2016 1108   RDW 13.9 04/04/2017 0935   LYMPHSABS 1.7 04/04/2017 0935   EOSABS 0.4 04/04/2017 0935   BASOSABS 0.1  04/04/2017 0935   Iron/TIBC/Ferritin/ %Sat No results found for: IRON, TIBC, FERRITIN, IRONPCTSAT Lipid Panel     Component Value Date/Time   CHOL 219 (H) 04/04/2017 0935   TRIG 161 (H) 04/04/2017 0935   HDL 86 04/04/2017 0935   CHOLHDL 2.1 08/09/2016 1108   VLDL 13 07/12/2014 0945   LDLCALC 101 (H) 04/04/2017 0935   Hepatic Function Panel     Component Value Date/Time   PROT 6.7 04/04/2017 0935   ALBUMIN 4.2 04/04/2017 0935   AST 13 04/04/2017 0935   ALT  12 04/04/2017 0935   ALKPHOS 86 04/04/2017 0935   BILITOT 0.4 04/04/2017 0935      Component Value Date/Time   TSH 0.926 04/04/2017 0935   TSH 1.50 08/09/2016 1108   TSH 1.308 02/07/2015 1010    ASSESSMENT AND PLAN: Essential hypertension  Other depression - with emotional eating  At risk for heart disease  Class 1 obesity with serious comorbidity and body mass index (BMI) of 32.0 to 32.9 in adult, unspecified obesity type  PLAN:  Hypertension We discussed sodium restriction, working on healthy weight loss, diet, and a regular exercise program as the means to achieve improved blood Atkinson control. Suleika agreed with this plan and agreed to follow up as directed. We will continue to monitor her blood Atkinson as well as her progress with the above lifestyle modifications. She will continue her medications as prescribed and will watch for signs of hypotension as she continues her lifestyle modifications. Darryl agrees to follow up with our clinic in 2 weeks.  Cardiovascular risk counselling Maurine was given extended (15 minutes) coronary artery disease prevention counseling today. She is 46 y.o. female and has risk factors for heart disease including obesity and hypertension. We discussed intensive lifestyle modifications today with an emphasis on specific weight loss instructions and strategies. Pt was also informed of the importance of increasing exercise and decreasing saturated fats to help prevent heart  disease.  Depression with Emotional Eating Behaviors We discussed behavior modification techniques today to help Aryiana deal with her emotional eating and depression. Janet agrees to continue Wellbutrin SR 150 mg BID and make emotional eating strategies. Lethia agrees to follow up with our clinic in 2 weeks.  Obesity Shalan is currently in the action stage of change. As such, her goal is to continue with weight loss efforts She has agreed to follow the Category 2 plan Dawt has been instructed to work up to a goal of 150 minutes of combined cardio and strengthening exercise per week or start bike riding for 15 minutes 3 times per week for weight loss and overall health benefits. We discussed the following Behavioral Modification Strategies today: increasing lean protein intake, emotional eating strategies, ways to avoid night time snacking, and better snacking choices.   Layonna has agreed to follow up with our clinic in 2 weeks. She was informed of the importance of frequent follow up visits to maximize her success with intensive lifestyle modifications for her multiple health conditions.  I, Trixie Dredge, am acting as transcriptionist for Dennard Nip, MD  I have reviewed the above documentation for accuracy and completeness, and I agree with the above. -Dennard Nip, MD      Today's visit was # 3 out of 22.  Starting weight: 198 lbs Starting date: 04/04/17 Today's weight : 194 lbs  Today's date: 05/03/2017 Total lbs lost to date: 4 (Patients must lose 7 lbs in the first 6 months to continue with counseling)   ASK: We discussed the diagnosis of obesity with Melissa Atkinson today and Edithe agreed to give Melissa Atkinson permission to discuss obesity behavioral modification therapy today.  ASSESS: Dymon has the diagnosis of obesity and her BMI today is 32.28 Seleny is in the action stage of change   ADVISE: Almyra was educated on the multiple health risks of obesity as well as the benefit of weight loss  to improve her health. She was advised of the need for long term treatment and the importance of lifestyle modifications.  AGREE: Multiple dietary modification options  and treatment options were discussed and  Shivali agreed to follow the Category 2 plan We discussed the following Behavioral Modification Strategies today: increasing lean protein intake, emotional eating strategies, ways to avoid night time snacking, and better snacking choices

## 2017-05-17 ENCOUNTER — Ambulatory Visit (INDEPENDENT_AMBULATORY_CARE_PROVIDER_SITE_OTHER): Payer: 59 | Admitting: Family Medicine

## 2017-05-17 VITALS — BP 115/82 | HR 74 | Temp 98.2°F | Ht 65.0 in | Wt 186.0 lb

## 2017-05-17 DIAGNOSIS — Z6831 Body mass index (BMI) 31.0-31.9, adult: Secondary | ICD-10-CM | POA: Diagnosis not present

## 2017-05-17 DIAGNOSIS — E669 Obesity, unspecified: Secondary | ICD-10-CM

## 2017-05-17 DIAGNOSIS — Z9189 Other specified personal risk factors, not elsewhere classified: Secondary | ICD-10-CM | POA: Diagnosis not present

## 2017-05-17 DIAGNOSIS — F3289 Other specified depressive episodes: Secondary | ICD-10-CM

## 2017-05-17 DIAGNOSIS — E559 Vitamin D deficiency, unspecified: Secondary | ICD-10-CM | POA: Diagnosis not present

## 2017-05-17 MED ORDER — BUPROPION HCL ER (SR) 150 MG PO TB12: 150.0000 mg | ORAL_TABLET | Freq: Two times a day (BID) | ORAL | 0 refills | Status: DC

## 2017-05-17 MED FILL — BUPROPION SR 150 MG TABLET: 150 | 30 days supply | Qty: 60 | Fill #0

## 2017-05-18 NOTE — Progress Notes (Signed)
Office: [phone]  /  Fax: [phone]   HPI:   Chief Complaint: OBESITY Melissa Atkinson is here to discuss her progress with her obesity treatment plan. She is on the Category 2 plan and is following her eating plan approximately 96 % of the time. She states she is exercising 0 minutes 0 times per week. Melissa Atkinson continues to do well with weight loss, she has lost more H20 this visit but still decreased approximately 3 pounds of fat which is an ideal weight loss.  Her weight is 186 lb (84.4 kg) today and has had a weight loss of 8 pounds over a period of 2 weeks since her last visit. She has lost 12 lbs since starting treatment with [place].  Vitamin D deficiency Melissa Atkinson has a diagnosis of vitamin D deficiency. She is on prescription Vit D weekly and she denies nausea, vomiting or muscle weakness.  At risk for osteopenia and osteoporosis Melissa Atkinson is at higher risk of osteopenia and osteoporosis due to vitamin D deficiency.   Depression with emotional eating behaviors Melissa Atkinson is on Wellbutrin and is doing very well with mood and decreased emotional eating. She is feeling more satisfied. Melissa Atkinson struggles with emotional eating and using food for comfort to the extent that it is negatively impacting her health. She often snacks when she is not hungry. Melissa Atkinson sometimes feels she is out of control and then feels guilty that she made poor food choices. She has been working on behavior modification techniques to help reduce her emotional eating and has been somewhat successful. She shows no sign of suicidal or homicidal ideations.  Depression screen PHQ 2/9 04/04/2017  Decreased Interest 2  Down, Depressed, Hopeless 3  PHQ - 2 Score 5  Altered sleeping 0  Tired, decreased energy 3  Change in appetite 1  Feeling bad or failure about yourself  3  Trouble concentrating 0  Moving slowly or fidgety/restless 0  Suicidal thoughts 0  PHQ-9 Score 12  Difficult doing work/chores Not difficult at all   ALLERGIES: No Known  Allergies  MEDICATIONS: Current Outpatient Medications on File Prior to Visit  Medication Sig Dispense Refill  . metoprolol succinate (TOPROL-XL) 25 MG 24 hr tablet TAKE 1 TABLET (25 MG TOTAL) BY MOUTH DAILY. 90 tablet 3  . triamcinolone cream (KENALOG) 0.1 % Apply 952 application topically 2 (two) times daily. 454 g 11  . Vitamin D, Ergocalciferol, (DRISDOL) 50000 units CAPS capsule Take 1 capsule (50,000 Units total) by mouth every 7 (seven) days. 4 capsule 0   No current facility-administered medications on file prior to visit.     PAST MEDICAL HISTORY: Past Medical History:  Diagnosis Date  . Anxiety   . Back pain   . Bicuspid aortic valve   . Breast cancer ([place])   . Depression   . Joint pain   . Kidney cyst, acquired   . Obesity   . Palpitations   . Polycythemia vera ([place])   . Thoracic aortic aneurysm ([place])   . Thyroid nodule     PAST SURGICAL HISTORY: Past Surgical History:  Procedure Laterality Date  . DILITATION & CURRETTAGE/HYSTROSCOPY WITH THERMACHOICE ABLATION N/A 12/28/2012   Procedure: DILATATION & CURETTAGE/HYSTEROSCOPY WITH THERMACHOICE ABLATION;  Surgeon: Cyril Mourning, MD;  Location: [facility];  Service: Gynecology;  Laterality: N/A;  . MASTECTOMY     bilateral  . PARTIAL NEPHRECTOMY     RT   . TONSILLECTOMY    . TUBAL LIGATION      SOCIAL HISTORY: Social  History   Tobacco Use  . Smoking status: Never Smoker  . Smokeless tobacco: Never Used  Substance Use Topics  . Alcohol use: Yes    Comment: Occasional  . Drug use: No    FAMILY HISTORY: Family History  Problem Relation Age of Onset  . Cancer Mother        lung  . Depression Mother   . Anxiety disorder Mother   . Bipolar disorder Mother   . Alcoholism Mother   . COPD Father   . Congestive Heart Failure Father   . Depression Father   . Alcoholism Father   . Stroke Maternal Grandfather     ROS: Review of Systems  Constitutional: Positive for weight loss.  Gastrointestinal:  Negative for nausea and vomiting.  Musculoskeletal:       Negative muscle weakness  Psychiatric/Behavioral: Positive for depression. Negative for suicidal ideas.    PHYSICAL EXAM: Blood pressure 115/82, pulse 74, temperature 98.2 F (36.8 C), height 5\' 5"  (1.651 m), weight 186 lb (84.4 kg), SpO2 99 %. Body mass index is 30.95 kg/m. Physical Exam  Constitutional: She is oriented to person, place, and time. She appears well-developed and well-nourished.  Cardiovascular: Normal rate.  Pulmonary/Chest: Effort normal.  Musculoskeletal: Normal range of motion.  Neurological: She is oriented to person, place, and time.  Skin: Skin is warm and dry.  Psychiatric: She has a normal mood and affect.  Vitals reviewed.   RECENT LABS AND TESTS: BMET    Component Value Date/Time   NA 140 04/04/2017 0935   K 4.2 04/04/2017 0935   CL 100 04/04/2017 0935   CO2 24 04/04/2017 0935   GLUCOSE 88 04/04/2017 0935   GLUCOSE 89 08/09/2016 1108   BUN 10 04/04/2017 0935   CREATININE 0.82 04/04/2017 0935   CREATININE 0.79 08/09/2016 1108   CALCIUM 9.4 04/04/2017 0935   GFRNONAA 85 04/04/2017 0935   GFRNONAA >89 05/07/2013 1030   GFRAA 99 04/04/2017 0935   GFRAA >89 05/07/2013 1030   Lab Results  Component Value Date   HGBA1C 5.2 04/04/2017   HGBA1C 5.0 08/09/2016   HGBA1C 5.2 07/12/2014   Lab Results  Component Value Date   INSULIN 9.1 04/04/2017   CBC    Component Value Date/Time   WBC 7.7 04/04/2017 0935   WBC 10.1 08/09/2016 1108   RBC 4.57 04/04/2017 0935   RBC 4.73 08/09/2016 1108   HGB 13.1 04/04/2017 0935   HCT 39.5 04/04/2017 0935   PLT 204 08/09/2016 1108   MCV 86 04/04/2017 0935   MCH 28.7 04/04/2017 0935   MCH 29.0 08/09/2016 1108   MCHC 33.2 04/04/2017 0935   MCHC 33.3 08/09/2016 1108   RDW 13.9 04/04/2017 0935   LYMPHSABS 1.7 04/04/2017 0935   EOSABS 0.4 04/04/2017 0935   BASOSABS 0.1 04/04/2017 0935   Iron/TIBC/Ferritin/ %Sat No results found for: IRON, TIBC,  FERRITIN, IRONPCTSAT Lipid Panel     Component Value Date/Time   CHOL 219 (H) 04/04/2017 0935   TRIG 161 (H) 04/04/2017 0935   HDL 86 04/04/2017 0935   CHOLHDL 2.1 08/09/2016 1108   VLDL 13 07/12/2014 0945   LDLCALC 101 (H) 04/04/2017 0935   Hepatic Function Panel     Component Value Date/Time   PROT 6.7 04/04/2017 0935   ALBUMIN 4.2 04/04/2017 0935   AST 13 04/04/2017 0935   ALT 12 04/04/2017 0935   ALKPHOS 86 04/04/2017 0935   BILITOT 0.4 04/04/2017 0935      Component Value  Date/Time   TSH 0.926 04/04/2017 0935   TSH 1.50 08/09/2016 1108   TSH 1.308 02/07/2015 1010    ASSESSMENT AND PLAN: Vitamin D deficiency  Other depression - with emotional eating - Plan: buPROPion (WELLBUTRIN SR) 150 MG 12 hr tablet  At risk for osteoporosis  Class 1 obesity with serious comorbidity and body mass index (BMI) of 31.0 to 31.9 in adult, unspecified obesity type  PLAN:  Vitamin D Deficiency Abigayl was informed that low vitamin D levels contributes to fatigue and are associated with obesity, breast, and colon cancer. Kaileen agrees to continue taking prescription Vit D @50 ,000 IU every week #4 and will follow up for routine testing of vitamin D, at least 2-3 times per year. She was informed of the risk of over-replacement of vitamin D and agrees to not increase her dose unless he discusses this with [place] first. We will recheck labs in 1 month and Rudine agrees to follow up with our clinic in 2 weeks.  At risk for osteopenia and osteoporosis Jisselle is at risk for osteopenia and osteoporsis due to her vitamin D deficiency. She was encouraged to take her vitamin D and follow her higher calcium diet and increase strengthening exercise to help strengthen her bones and decrease her risk of osteopenia and osteoporosis.  Depression with Emotional Eating Behaviors We discussed behavior modification techniques today to help Annye deal with her emotional eating and depression. Bridgitte agrees to continue taking  Wellbutrin SR 150 mg BID #60 and we will refill for 1 month. Sena agrees to follow up with our clinic in 2 weeks.  Obesity Iridessa is currently in the action stage of change. As such, her goal is to continue with weight loss efforts She has agreed to follow the Category 2 plan Sanskriti has been instructed to work up to a goal of 150 minutes of combined cardio and strengthening exercise per week for weight loss and overall health benefits. We discussed the following Behavioral Modification Strategies today: increasing lean protein intake, decreasing simple carbohydrates, dealing with family or coworker sabotage and holiday eating strategies and travel eating strategies   Gerline has agreed to follow up with our clinic in 2 weeks. She was informed of the importance of frequent follow up visits to maximize her success with intensive lifestyle modifications for her multiple health conditions.  I, Trixie Dredge, am acting as transcriptionist for Dennard Nip, MD  I have reviewed the above documentation for accuracy and completeness, and I agree with the above. -Dennard Nip, MD     Today's visit was # 4 out of 22.  Starting weight: 198 lbs Starting date: 04/04/17 Today's weight : 186 lbs  Today's date: 05/17/2017 Total lbs lost to date: 12 (Patients must lose 7 lbs in the first 6 months to continue with counseling)   ASK: We discussed the diagnosis of obesity with [name] today and Tiara agreed to give [place] permission to discuss obesity behavioral modification therapy today.  ASSESS: Keylani has the diagnosis of obesity and her BMI today is 30.95 Senna is in the action stage of change   ADVISE: Farran was educated on the multiple health risks of obesity as well as the benefit of weight loss to improve her health. She was advised of the need for long term treatment and the importance of lifestyle modifications.  AGREE: Multiple dietary modification options and treatment options were discussed  and  Priscilla agreed to follow the Category 2 plan We discussed the following Behavioral Modification  Strategies today: increasing lean protein intake, decreasing simple carbohydrates, dealing with family or coworker sabotage and holiday eating strategies and travel eating strategies

## 2017-05-31 ENCOUNTER — Ambulatory Visit (INDEPENDENT_AMBULATORY_CARE_PROVIDER_SITE_OTHER): Payer: 59 | Admitting: Family Medicine

## 2017-05-31 VITALS — BP 114/80 | HR 66 | Temp 98.4°F | Ht 65.0 in | Wt 185.0 lb

## 2017-05-31 DIAGNOSIS — Z9189 Other specified personal risk factors, not elsewhere classified: Secondary | ICD-10-CM

## 2017-05-31 DIAGNOSIS — F3289 Other specified depressive episodes: Secondary | ICD-10-CM | POA: Diagnosis not present

## 2017-05-31 DIAGNOSIS — E669 Obesity, unspecified: Secondary | ICD-10-CM

## 2017-05-31 DIAGNOSIS — E559 Vitamin D deficiency, unspecified: Secondary | ICD-10-CM

## 2017-05-31 DIAGNOSIS — Z6830 Body mass index (BMI) 30.0-30.9, adult: Secondary | ICD-10-CM | POA: Diagnosis not present

## 2017-05-31 MED ORDER — VITAMIN D (ERGOCALCIFEROL) 1.25 MG (50000 UNIT) PO CAPS: 50000.0000 [IU] | ORAL_CAPSULE | ORAL | 0 refills | Status: DC

## 2017-05-31 MED ORDER — BUPROPION HCL ER (SR) 150 MG PO TB12: 150.0000 mg | ORAL_TABLET | Freq: Two times a day (BID) | ORAL | 0 refills | Status: DC

## 2017-05-31 MED FILL — VIT D2 1.25 MG (50,000 UNIT: 1.25 MG | 28 days supply | Qty: 4 | Fill #0

## 2017-05-31 NOTE — Progress Notes (Signed)
Office: [phone]  /  Fax: [phone]   HPI:   Chief Complaint: OBESITY Melissa Atkinson is here to discuss her progress with her obesity treatment plan. She is on the Category 2 plan and is following her eating plan approximately 96 % of the time. She states she is walking 35 minutes 2-3 times per week. Melissa Atkinson did well with weight loss even over Thanksgiving. Her hunger is controlled and she is doing well with increasing lean protein and vegetables.  Her weight is 185 lb (83.9 kg) today and has had a weight loss of 1 pound over a period of 2 weeks since her last visit. She has lost 13 lbs since starting treatment with [place].  Vitamin D deficiency Melissa Atkinson has a diagnosis of vitamin D deficiency. She is stable on prescription Vit D, but not yet at goal. She denies nausea, vomiting or muscle weakness.  At risk for cardiovascular disease Melissa Atkinson is at a higher than average risk for cardiovascular disease due to obesity. She currently denies any chest pain.  Depression with emotional eating behaviors Melissa Atkinson's mood is stable on Wellbutrin, her blood pressure is stable and she is doing well with decreasing emotional eating. Melissa Atkinson struggles with emotional eating and using food for comfort to the extent that it is negatively impacting her health. She often snacks when she is not hungry. Melissa Atkinson sometimes feels she is out of control and then feels guilty that she made poor food choices. She has been working on behavior modification techniques to help reduce her emotional eating and has been somewhat successful. She shows no sign of suicidal or homicidal ideations.  Depression screen PHQ 2/9 04/04/2017  Decreased Interest 2  Down, Depressed, Hopeless 3  PHQ - 2 Score 5  Altered sleeping 0  Tired, decreased energy 3  Change in appetite 1  Feeling bad or failure about yourself  3  Trouble concentrating 0  Moving slowly or fidgety/restless 0  Suicidal thoughts 0  PHQ-9 Score 12  Difficult doing work/chores Not  difficult at all   ALLERGIES: No Known Allergies  MEDICATIONS: Current Outpatient Medications on File Prior to Visit  Medication Sig Dispense Refill  . metoprolol succinate (TOPROL-XL) 25 MG 24 hr tablet TAKE 1 TABLET (25 MG TOTAL) BY MOUTH DAILY. 90 tablet 3  . triamcinolone cream (KENALOG) 0.1 % Apply 099 application topically 2 (two) times daily. 454 g 11   No current facility-administered medications on file prior to visit.     PAST MEDICAL HISTORY: Past Medical History:  Diagnosis Date  . Anxiety   . Back pain   . Bicuspid aortic valve   . Breast cancer ([place])   . Depression   . Joint pain   . Kidney cyst, acquired   . Obesity   . Palpitations   . Polycythemia vera ([place])   . Thoracic aortic aneurysm ([place])   . Thyroid nodule     PAST SURGICAL HISTORY: Past Surgical History:  Procedure Laterality Date  . DILITATION & CURRETTAGE/HYSTROSCOPY WITH THERMACHOICE ABLATION N/A 12/28/2012   Procedure: DILATATION & CURETTAGE/HYSTEROSCOPY WITH THERMACHOICE ABLATION;  Surgeon: Cyril Mourning, MD;  Location: [facility];  Service: Gynecology;  Laterality: N/A;  . MASTECTOMY     bilateral  . PARTIAL NEPHRECTOMY     RT   . TONSILLECTOMY    . TUBAL LIGATION      SOCIAL HISTORY: Social History   Tobacco Use  . Smoking status: Never Smoker  . Smokeless tobacco: Never Used  Substance Use Topics  .  Alcohol use: Yes    Comment: Occasional  . Drug use: No    FAMILY HISTORY: Family History  Problem Relation Age of Onset  . Cancer Mother        lung  . Depression Mother   . Anxiety disorder Mother   . Bipolar disorder Mother   . Alcoholism Mother   . COPD Father   . Congestive Heart Failure Father   . Depression Father   . Alcoholism Father   . Stroke Maternal Grandfather     ROS: Review of Systems  Constitutional: Positive for weight loss.  Cardiovascular: Negative for chest pain.  Gastrointestinal: Negative for nausea and vomiting.  Musculoskeletal:        Negative muscle weakness  Psychiatric/Behavioral: Positive for depression. Negative for suicidal ideas.    PHYSICAL EXAM: Blood pressure 114/80, pulse 66, temperature 98.4 F (36.9 C), height 5\' 5"  (1.651 m), weight 185 lb (83.9 kg), SpO2 98 %. Body mass index is 30.79 kg/m. Physical Exam  Constitutional: She is oriented to person, place, and time. She appears well-developed and well-nourished.  Cardiovascular: Normal rate.  Pulmonary/Chest: Effort normal.  Musculoskeletal: Normal range of motion.  Neurological: She is oriented to person, place, and time.  Skin: Skin is warm and dry.  Psychiatric: She has a normal mood and affect.  Vitals reviewed.   RECENT LABS AND TESTS: BMET    Component Value Date/Time   NA 140 04/04/2017 0935   K 4.2 04/04/2017 0935   CL 100 04/04/2017 0935   CO2 24 04/04/2017 0935   GLUCOSE 88 04/04/2017 0935   GLUCOSE 89 08/09/2016 1108   BUN 10 04/04/2017 0935   CREATININE 0.82 04/04/2017 0935   CREATININE 0.79 08/09/2016 1108   CALCIUM 9.4 04/04/2017 0935   GFRNONAA 85 04/04/2017 0935   GFRNONAA >89 05/07/2013 1030   GFRAA 99 04/04/2017 0935   GFRAA >89 05/07/2013 1030   Lab Results  Component Value Date   HGBA1C 5.2 04/04/2017   HGBA1C 5.0 08/09/2016   HGBA1C 5.2 07/12/2014   Lab Results  Component Value Date   INSULIN 9.1 04/04/2017   CBC    Component Value Date/Time   WBC 7.7 04/04/2017 0935   WBC 10.1 08/09/2016 1108   RBC 4.57 04/04/2017 0935   RBC 4.73 08/09/2016 1108   HGB 13.1 04/04/2017 0935   HCT 39.5 04/04/2017 0935   PLT 204 08/09/2016 1108   MCV 86 04/04/2017 0935   MCH 28.7 04/04/2017 0935   MCH 29.0 08/09/2016 1108   MCHC 33.2 04/04/2017 0935   MCHC 33.3 08/09/2016 1108   RDW 13.9 04/04/2017 0935   LYMPHSABS 1.7 04/04/2017 0935   EOSABS 0.4 04/04/2017 0935   BASOSABS 0.1 04/04/2017 0935   Iron/TIBC/Ferritin/ %Sat No results found for: IRON, TIBC, FERRITIN, IRONPCTSAT Lipid Panel     Component Value  Date/Time   CHOL 219 (H) 04/04/2017 0935   TRIG 161 (H) 04/04/2017 0935   HDL 86 04/04/2017 0935   CHOLHDL 2.1 08/09/2016 1108   VLDL 13 07/12/2014 0945   LDLCALC 101 (H) 04/04/2017 0935   Hepatic Function Panel     Component Value Date/Time   PROT 6.7 04/04/2017 0935   ALBUMIN 4.2 04/04/2017 0935   AST 13 04/04/2017 0935   ALT 12 04/04/2017 0935   ALKPHOS 86 04/04/2017 0935   BILITOT 0.4 04/04/2017 0935      Component Value Date/Time   TSH 0.926 04/04/2017 0935   TSH 1.50 08/09/2016 1108   TSH 1.308  02/07/2015 1010    ASSESSMENT AND PLAN: Vitamin D deficiency - Plan: Vitamin D, Ergocalciferol, (DRISDOL) 50000 units CAPS capsule  Other depression - with emotional eating - Plan: buPROPion (WELLBUTRIN SR) 150 MG 12 hr tablet  At risk for heart disease  Class 1 obesity with serious comorbidity and body mass index (BMI) of 30.0 to 30.9 in adult, unspecified obesity type  PLAN:  Vitamin D Deficiency Melissa Atkinson was informed that low vitamin D levels contributes to fatigue and are associated with obesity, breast, and colon cancer. Melissa Atkinson agrees to continue taking prescription Vit D @50 ,000 IU every week #4 and we will refill for 1 month. She will follow up for routine testing of vitamin D, at least 2-3 times per year. She was informed of the risk of over-replacement of vitamin D and agrees to not increase her dose unless he discusses this with [place] first. Melissa Atkinson agrees to follow up with our clinic in 2 weeks.  Cardiovascular risk counselling Melissa Atkinson was given extended (15 minutes) coronary artery disease prevention counseling today. She is 46 y.o. female and has risk factors for heart disease including obesity. We discussed intensive lifestyle modifications today with an emphasis on specific weight loss instructions and strategies. Pt was also informed of the importance of increasing exercise and decreasing saturated fats to help prevent heart disease.  Depression with Emotional Eating  Behaviors We discussed behavior modification techniques today to help Melissa Atkinson deal with her emotional eating and depression. Melissa Atkinson agrees to continue taking Wellbutrin SR 150 mg BID and we will refill for 1 month. Melissa Atkinson agrees to follow up with our clinic in 2 weeks.  Obesity Melissa Atkinson is currently in the action stage of change. As such, her goal is to continue with weight loss efforts She has agreed to follow the Category 2 plan Melissa Atkinson has been instructed to work up to a goal of 150 minutes of combined cardio and strengthening exercise per week for weight loss and overall health benefits. We discussed the following Behavioral Modification Strategies today: increasing lean protein intake, dealing with family or coworker sabotage and holiday eating strategies    Melissa Atkinson has agreed to follow up with our clinic in 2 weeks. She was informed of the importance of frequent follow up visits to maximize her success with intensive lifestyle modifications for her multiple health conditions.  I, Trixie Dredge, am acting as transcriptionist for Dennard Nip, MD  I have reviewed the above documentation for accuracy and completeness, and I agree with the above. -Dennard Nip, MD     Today's visit was # 5 out of 22.  Starting weight: 198 lbs Starting date: 04/04/17 Today's weight : 185 lbs  Today's date: 05/31/2017 Total lbs lost to date: 13 (Patients must lose 7 lbs in the first 6 months to continue with counseling)   ASK: We discussed the diagnosis of obesity with Melissa Atkinson today and Melissa Atkinson agreed to give [place] permission to discuss obesity behavioral modification therapy today.  ASSESS: Melissa Atkinson has the diagnosis of obesity and her BMI today is 30.79 Melissa Atkinson is in the action stage of change   ADVISE: Melissa Atkinson was educated on the multiple health risks of obesity as well as the benefit of weight loss to improve her health. She was advised of the need for long term treatment and the importance of lifestyle  modifications.  AGREE: Multiple dietary modification options and treatment options were discussed and  Melissa Atkinson agreed to follow the Category 2 plan We discussed the following Behavioral Modification Strategies  today: increasing lean protein intake, dealing with family or coworker sabotage and holiday eating strategies

## 2017-06-14 ENCOUNTER — Ambulatory Visit (INDEPENDENT_AMBULATORY_CARE_PROVIDER_SITE_OTHER): Payer: 59 | Admitting: Family Medicine

## 2017-06-14 VITALS — BP 131/81 | HR 77 | Temp 98.3°F | Ht 65.0 in | Wt 188.0 lb

## 2017-06-14 DIAGNOSIS — E669 Obesity, unspecified: Secondary | ICD-10-CM | POA: Diagnosis not present

## 2017-06-14 DIAGNOSIS — Z6831 Body mass index (BMI) 31.0-31.9, adult: Secondary | ICD-10-CM

## 2017-06-14 DIAGNOSIS — I10 Essential (primary) hypertension: Secondary | ICD-10-CM | POA: Diagnosis not present

## 2017-06-14 NOTE — Progress Notes (Signed)
Office: [phone]  /  Fax: [phone]   HPI:   Chief Complaint: OBESITY Melissa Atkinson is here to discuss her progress with her obesity treatment plan. She is on the Category 2 plan and is following her eating plan approximately 80 % of the time. She states she is riding stationary bike for 15 minutes 2 times per week. Melissa Atkinson is retaining fluid today. She has struggled with increased celebration eating but she is not eating too many sweets. She has had a lot of company, so more special occasion food.  Her weight is 188 lb (85.3 kg) today and has gained 3 pounds since her last visit. She has lost 10 lbs since starting treatment with [place].  Hypertension Melissa Atkinson is a 46 y.o. female with hypertension. Melissa Atkinson is on metoprolol and her blood pressure is well controlled. She denies chest pain, headaches, or lightheadedness. She is working weight loss to help control her blood pressure with the goal of decreasing her risk of heart attack and stroke.   ALLERGIES: No Known Allergies  MEDICATIONS: Current Outpatient Medications on File Prior to Visit  Medication Sig Dispense Refill  . buPROPion (WELLBUTRIN SR) 150 MG 12 hr tablet Take 1 tablet (150 mg total) by mouth 2 (two) times daily. 60 tablet 0  . metoprolol succinate (TOPROL-XL) 25 MG 24 hr tablet TAKE 1 TABLET (25 MG TOTAL) BY MOUTH DAILY. 90 tablet 3  . triamcinolone cream (KENALOG) 0.1 % Apply 259 application topically 2 (two) times daily. 454 g 11  . Vitamin D, Ergocalciferol, (DRISDOL) 50000 units CAPS capsule Take 1 capsule (50,000 Units total) by mouth every 7 (seven) days. 4 capsule 0   No current facility-administered medications on file prior to visit.     PAST MEDICAL HISTORY: Past Medical History:  Diagnosis Date  . Anxiety   . Back pain   . Bicuspid aortic valve   . Breast cancer ([place])   . Depression   . Joint pain   . Kidney cyst, acquired   . Obesity   . Palpitations   . Polycythemia vera ([place])   . Thoracic aortic  aneurysm ([place])   . Thyroid nodule     PAST SURGICAL HISTORY: Past Surgical History:  Procedure Laterality Date  . DILITATION & CURRETTAGE/HYSTROSCOPY WITH THERMACHOICE ABLATION N/A 12/28/2012   Procedure: DILATATION & CURETTAGE/HYSTEROSCOPY WITH THERMACHOICE ABLATION;  Surgeon: Cyril Mourning, MD;  Location: [facility];  Service: Gynecology;  Laterality: N/A;  . MASTECTOMY     bilateral  . PARTIAL NEPHRECTOMY     RT   . TONSILLECTOMY    . TUBAL LIGATION      SOCIAL HISTORY: Social History   Tobacco Use  . Smoking status: Never Smoker  . Smokeless tobacco: Never Used  Substance Use Topics  . Alcohol use: Yes    Comment: Occasional  . Drug use: No    FAMILY HISTORY: Family History  Problem Relation Age of Onset  . Cancer Mother        lung  . Depression Mother   . Anxiety disorder Mother   . Bipolar disorder Mother   . Alcoholism Mother   . COPD Father   . Congestive Heart Failure Father   . Depression Father   . Alcoholism Father   . Stroke Maternal Grandfather     ROS: Review of Systems  Constitutional: Negative for weight loss.  Cardiovascular: Negative for chest pain.  Neurological: Negative for headaches.       Negative lightheadedness  PHYSICAL EXAM: Blood pressure 131/81, pulse 77, temperature 98.3 F (36.8 C), temperature source Oral, height 5\' 5"  (1.651 m), weight 188 lb (85.3 kg), last menstrual period 06/13/2017, SpO2 99 %. Body mass index is 31.28 kg/m. Physical Exam  Constitutional: She is oriented to person, place, and time. She appears well-developed and well-nourished.  Cardiovascular: Normal rate.  Pulmonary/Chest: Effort normal.  Musculoskeletal: Normal range of motion.  Neurological: She is oriented to person, place, and time.  Skin: Skin is warm and dry.  Psychiatric: She has a normal mood and affect. Her behavior is normal.  Vitals reviewed.   RECENT LABS AND TESTS: BMET    Component Value Date/Time   NA 140 04/04/2017 0935    K 4.2 04/04/2017 0935   CL 100 04/04/2017 0935   CO2 24 04/04/2017 0935   GLUCOSE 88 04/04/2017 0935   GLUCOSE 89 08/09/2016 1108   BUN 10 04/04/2017 0935   CREATININE 0.82 04/04/2017 0935   CREATININE 0.79 08/09/2016 1108   CALCIUM 9.4 04/04/2017 0935   GFRNONAA 85 04/04/2017 0935   GFRNONAA >89 05/07/2013 1030   GFRAA 99 04/04/2017 0935   GFRAA >89 05/07/2013 1030   Lab Results  Component Value Date   HGBA1C 5.2 04/04/2017   HGBA1C 5.0 08/09/2016   HGBA1C 5.2 07/12/2014   Lab Results  Component Value Date   INSULIN 9.1 04/04/2017   CBC    Component Value Date/Time   WBC 7.7 04/04/2017 0935   WBC 10.1 08/09/2016 1108   RBC 4.57 04/04/2017 0935   RBC 4.73 08/09/2016 1108   HGB 13.1 04/04/2017 0935   HCT 39.5 04/04/2017 0935   PLT 204 08/09/2016 1108   MCV 86 04/04/2017 0935   MCH 28.7 04/04/2017 0935   MCH 29.0 08/09/2016 1108   MCHC 33.2 04/04/2017 0935   MCHC 33.3 08/09/2016 1108   RDW 13.9 04/04/2017 0935   LYMPHSABS 1.7 04/04/2017 0935   EOSABS 0.4 04/04/2017 0935   BASOSABS 0.1 04/04/2017 0935   Iron/TIBC/Ferritin/ %Sat No results found for: IRON, TIBC, FERRITIN, IRONPCTSAT Lipid Panel     Component Value Date/Time   CHOL 219 (H) 04/04/2017 0935   TRIG 161 (H) 04/04/2017 0935   HDL 86 04/04/2017 0935   CHOLHDL 2.1 08/09/2016 1108   VLDL 13 07/12/2014 0945   LDLCALC 101 (H) 04/04/2017 0935   Hepatic Function Panel     Component Value Date/Time   PROT 6.7 04/04/2017 0935   ALBUMIN 4.2 04/04/2017 0935   AST 13 04/04/2017 0935   ALT 12 04/04/2017 0935   ALKPHOS 86 04/04/2017 0935   BILITOT 0.4 04/04/2017 0935      Component Value Date/Time   TSH 0.926 04/04/2017 0935   TSH 1.50 08/09/2016 1108   TSH 1.308 02/07/2015 1010    ASSESSMENT AND PLAN: Essential hypertension  Class 1 obesity with serious comorbidity and body mass index (BMI) of 31.0 to 31.9 in adult, unspecified obesity type  PLAN:  Hypertension We discussed sodium  restriction, working on healthy weight loss, diet, and a regular exercise program as the means to achieve improved blood pressure control. Melissa Atkinson agreed with this plan and agreed to follow up as directed. We will continue to monitor her blood pressure as well as her progress with the above lifestyle modifications. She will continue her medications as prescribed and will watch for signs of hypotension as she continues her lifestyle modifications. Melissa Atkinson agrees to follow up with our clinic in 3 weeks.  We spent > than 50%  of the 15 minute visit on the counseling as documented in the note.  Obesity Melissa Atkinson is currently in the action stage of change. As such, her goal is to continue with weight loss efforts She has agreed to follow the Category 2 plan Melissa Atkinson has been instructed to work up to a goal of 150 minutes of combined cardio and strengthening exercise per week for weight loss and overall health benefits. We discussed the following Behavioral Modification Strategies today: decreasing sodium intake, holiday eating strategies, and increase H20 intake    Melissa Atkinson has agreed to follow up with our clinic in 3 weeks. She was informed of the importance of frequent follow up visits to maximize her success with intensive lifestyle modifications for her multiple health conditions.  I, Trixie Dredge, am acting as transcriptionist for Dennard Nip, MD  I have reviewed the above documentation for accuracy and completeness, and I agree with the above. -Dennard Nip, MD     Today's visit was # 6 out of 22.  Starting weight: 198 lbs Starting date: 04/04/17 Today's weight : 188 lbs  Today's date: 06/14/2017 Total lbs lost to date: 10 (Patients must lose 7 lbs in the first 6 months to continue with counseling)   ASK: We discussed the diagnosis of obesity with [name] today and Dennisse agreed to give [place] permission to discuss obesity behavioral modification therapy today.  ASSESS: Renaye has the diagnosis of  obesity and her BMI today is 31.28 Lakelynn is in the action stage of change   ADVISE: Kariana was educated on the multiple health risks of obesity as well as the benefit of weight loss to improve her health. She was advised of the need for long term treatment and the importance of lifestyle modifications.  AGREE: Multiple dietary modification options and treatment options were discussed and  Lanore agreed to follow the Category 2 plan We discussed the following Behavioral Modification Strategies today: decreasing sodium intake, holiday eating strategies, and increase H20 intake

## 2017-06-15 MED FILL — BUPROPION SR 150 MG TABLET: 150 | 30 days supply | Qty: 60 | Fill #0

## 2017-07-05 ENCOUNTER — Ambulatory Visit (INDEPENDENT_AMBULATORY_CARE_PROVIDER_SITE_OTHER): Payer: 59 | Admitting: Family Medicine

## 2017-07-05 VITALS — BP 112/78 | HR 89 | Temp 97.9°F | Ht 65.0 in | Wt 189.0 lb

## 2017-07-05 DIAGNOSIS — F3289 Other specified depressive episodes: Secondary | ICD-10-CM

## 2017-07-05 DIAGNOSIS — E669 Obesity, unspecified: Secondary | ICD-10-CM

## 2017-07-05 DIAGNOSIS — E559 Vitamin D deficiency, unspecified: Secondary | ICD-10-CM

## 2017-07-05 DIAGNOSIS — Z6831 Body mass index (BMI) 31.0-31.9, adult: Secondary | ICD-10-CM

## 2017-07-05 DIAGNOSIS — Z9189 Other specified personal risk factors, not elsewhere classified: Secondary | ICD-10-CM | POA: Diagnosis not present

## 2017-07-05 MED ORDER — BUPROPION HCL ER (SR) 150 MG PO TB12: 150.0000 mg | ORAL_TABLET | Freq: Two times a day (BID) | ORAL | 0 refills | Status: DC

## 2017-07-05 MED ORDER — VITAMIN D (ERGOCALCIFEROL) 1.25 MG (50000 UNIT) PO CAPS: 50000.0000 [IU] | ORAL_CAPSULE | ORAL | 0 refills | Status: DC

## 2017-07-05 NOTE — Progress Notes (Signed)
Office: [phone]  /  Fax: [phone]   HPI:   Chief Complaint: OBESITY Melissa Atkinson is here to discuss her progress with her obesity treatment plan. She is on the Category 2 plan and is following her eating plan approximately 85 % of the time. She states she is bicycling for 15 to 30 minutes 2 to 4 times per week. [name] did well minimizing weight gain over the holidays. She is getting bored with the category 2 plan and would like to discuss other dietary options. Her weight is 189 lb (85.7 kg) today and has had a weight gain of 1 pound over a period of 3 weeks since her last visit. She has lost 9 lbs since starting treatment with [place].  Vitamin D deficiency [name] has a diagnosis of vitamin D deficiency. She is stable on vit D and denies nausea, vomiting or muscle weakness.   Ref. Range 04/04/2017 09:35  Vitamin D, 25-Hydroxy Latest Ref Range: 30.0 - 100.0 ng/mL 19.2 (L)   At risk for cardiovascular disease [name] is at a higher than average risk for cardiovascular disease due to obesity. She currently denies any chest pain.  Depression with emotional eating behaviors [name] is getting frustrated with weight loss and notes almost binge eating at times in the evening. She feels out of control. [name] struggles with emotional eating and using food for comfort to the extent that it is negatively impacting her health. She often snacks when she is not hungry. [name] sometimes feels she is out of control and then feels guilty that she made poor food choices. She has been working on behavior modification techniques to help reduce her emotional eating and has been somewhat successful. She shows no sign of suicidal or homicidal ideations.  Depression screen PHQ 2/9 04/04/2017  Decreased Interest 2  Down, Depressed, Hopeless 3  PHQ - 2 Score 5  Altered sleeping 0  Tired, decreased energy 3  Change in appetite 1  Feeling bad or failure about yourself  3  Trouble concentrating 0  Moving slowly or  fidgety/restless 0  Suicidal thoughts 0  PHQ-9 Score 12  Difficult doing work/chores Not difficult at all      ALLERGIES: No Known Allergies  MEDICATIONS: Current Outpatient Medications on File Prior to Visit  Medication Sig Dispense Refill  . buPROPion (WELLBUTRIN SR) 150 MG 12 hr tablet Take 1 tablet (150 mg total) by mouth 2 (two) times daily. 60 tablet 0  . metoprolol succinate (TOPROL-XL) 25 MG 24 hr tablet TAKE 1 TABLET (25 MG TOTAL) BY MOUTH DAILY. 90 tablet 3  . triamcinolone cream (KENALOG) 0.1 % Apply 408 application topically 2 (two) times daily. 454 g 11  . Vitamin D, Ergocalciferol, (DRISDOL) 50000 units CAPS capsule Take 1 capsule (50,000 Units total) by mouth every 7 (seven) days. 4 capsule 0   No current facility-administered medications on file prior to visit.     PAST MEDICAL HISTORY: Past Medical History:  Diagnosis Date  . Anxiety   . Back pain   . Bicuspid aortic valve   . Breast cancer ([place])   . Depression   . Joint pain   . Kidney cyst, acquired   . Obesity   . Palpitations   . Polycythemia vera ([place])   . Thoracic aortic aneurysm ([place])   . Thyroid nodule     PAST SURGICAL HISTORY: Past Surgical History:  Procedure Laterality Date  . DILITATION & CURRETTAGE/HYSTROSCOPY WITH THERMACHOICE ABLATION N/A 12/28/2012   Procedure: DILATATION & CURETTAGE/HYSTEROSCOPY WITH  THERMACHOICE ABLATION;  Surgeon: Cyril Mourning, MD;  Location: [facility];  Service: Gynecology;  Laterality: N/A;  . MASTECTOMY     bilateral  . PARTIAL NEPHRECTOMY     RT   . TONSILLECTOMY    . TUBAL LIGATION      SOCIAL HISTORY: Social History   Tobacco Use  . Smoking status: Never Smoker  . Smokeless tobacco: Never Used  Substance Use Topics  . Alcohol use: Yes    Comment: Occasional  . Drug use: No    FAMILY HISTORY: Family History  Problem Relation Age of Onset  . Cancer Mother        lung  . Depression Mother   . Anxiety disorder Mother   . Bipolar disorder  Mother   . Alcoholism Mother   . COPD Father   . Congestive Heart Failure Father   . Depression Father   . Alcoholism Father   . Stroke Maternal Grandfather     ROS: Review of Systems  Constitutional: Negative for weight loss.  Cardiovascular: Negative for chest pain.  Gastrointestinal: Negative for nausea and vomiting.  Musculoskeletal:       Negative muscle weakness  Psychiatric/Behavioral: Positive for depression. Negative for suicidal ideas.    PHYSICAL EXAM: Blood pressure 112/78, pulse 89, temperature 97.9 F (36.6 C), temperature source Oral, height 5' 5"  (1.651 m), weight 189 lb (85.7 kg), last menstrual period 06/13/2017, SpO2 99 %. Body mass index is 31.45 kg/m. Physical Exam  Constitutional: She is oriented to person, place, and time. She appears well-developed and well-nourished.  Cardiovascular: Normal rate.  Pulmonary/Chest: Effort normal.  Musculoskeletal: Normal range of motion.  Neurological: She is oriented to person, place, and time.  Skin: Skin is warm and dry.  Psychiatric: She has a normal mood and affect. Her behavior is normal.  Vitals reviewed.   RECENT LABS AND TESTS: BMET    Component Value Date/Time   NA 140 04/04/2017 0935   K 4.2 04/04/2017 0935   CL 100 04/04/2017 0935   CO2 24 04/04/2017 0935   GLUCOSE 88 04/04/2017 0935   GLUCOSE 89 08/09/2016 1108   BUN 10 04/04/2017 0935   CREATININE 0.82 04/04/2017 0935   CREATININE 0.79 08/09/2016 1108   CALCIUM 9.4 04/04/2017 0935   GFRNONAA 85 04/04/2017 0935   GFRNONAA >89 05/07/2013 1030   GFRAA 99 04/04/2017 0935   GFRAA >89 05/07/2013 1030   Lab Results  Component Value Date   HGBA1C 5.2 04/04/2017   HGBA1C 5.0 08/09/2016   HGBA1C 5.2 07/12/2014   Lab Results  Component Value Date   INSULIN 9.1 04/04/2017   CBC    Component Value Date/Time   WBC 7.7 04/04/2017 0935   WBC 10.1 08/09/2016 1108   RBC 4.57 04/04/2017 0935   RBC 4.73 08/09/2016 1108   HGB 13.1 04/04/2017 0935    HCT 39.5 04/04/2017 0935   PLT 204 08/09/2016 1108   MCV 86 04/04/2017 0935   MCH 28.7 04/04/2017 0935   MCH 29.0 08/09/2016 1108   MCHC 33.2 04/04/2017 0935   MCHC 33.3 08/09/2016 1108   RDW 13.9 04/04/2017 0935   LYMPHSABS 1.7 04/04/2017 0935   EOSABS 0.4 04/04/2017 0935   BASOSABS 0.1 04/04/2017 0935   Iron/TIBC/Ferritin/ %Sat No results found for: IRON, TIBC, FERRITIN, IRONPCTSAT Lipid Panel     Component Value Date/Time   CHOL 219 (H) 04/04/2017 0935   TRIG 161 (H) 04/04/2017 0935   HDL 86 04/04/2017 0935   CHOLHDL 2.1  08/09/2016 1108   VLDL 13 07/12/2014 0945   LDLCALC 101 (H) 04/04/2017 0935   Hepatic Function Panel     Component Value Date/Time   PROT 6.7 04/04/2017 0935   ALBUMIN 4.2 04/04/2017 0935   AST 13 04/04/2017 0935   ALT 12 04/04/2017 0935   ALKPHOS 86 04/04/2017 0935   BILITOT 0.4 04/04/2017 0935      Component Value Date/Time   TSH 0.926 04/04/2017 0935   TSH 1.50 08/09/2016 1108   TSH 1.308 02/07/2015 1010    Ref. Range 04/04/2017 09:35  Vitamin D, 25-Hydroxy Latest Ref Range: 30.0 - 100.0 ng/mL 19.2 (L)    ASSESSMENT AND PLAN: Vitamin D deficiency - Plan: Vitamin D, Ergocalciferol, (DRISDOL) 50000 units CAPS capsule  Other depression - with emotional eating - Plan: buPROPion (WELLBUTRIN SR) 150 MG 12 hr tablet  At risk for heart disease  Class 1 obesity with serious comorbidity and body mass index (BMI) of 31.0 to 31.9 in adult, unspecified obesity type  PLAN:  Vitamin D Deficiency Mendy was informed that low vitamin D levels contributes to fatigue and are associated with obesity, breast, and colon cancer. She agrees to continue to take prescription Vit D @50 ,000 IU every week #4 with no refills and will follow up for routine testing of vitamin D, at least 2-3 times per year. She was informed of the risk of over-replacement of vitamin D and agrees to not increase her dose unless he discusses this with [place] first. Malu agrees to follow up  with our clinic in 2 weeks.  Cardiovascular risk counseling Debora was given extended (15 minutes) coronary artery disease prevention counseling today. She is 47 y.o. female and has risk factors for heart disease including obesity. We discussed intensive lifestyle modifications today with an emphasis on specific weight loss instructions and strategies. Pt was also informed of the importance of increasing exercise and decreasing saturated fats to help prevent heart disease.  Depression with Emotional Eating Behaviors We discussed behavior modification techniques today to help Mackinzie deal with her emotional eating and depression. She has agreed to take Wellbutrin SR 150 mg bid #60 with no refills and follow up as directed.  Obesity Saachi is currently in the action stage of change. As such, her goal is to continue with weight loss efforts She decided to stick with the Category 2 plan Lilas has been instructed to work up to a goal of 150 minutes of combined cardio and strengthening exercise per week for weight loss and overall health benefits. We discussed the following Behavioral Modification Strategies today: increasing lean protein intake, decreasing simple carbohydrates  and emotional eating strategies  Aeron has agreed to follow up with our clinic in 2 weeks. She was informed of the importance of frequent follow up visits to maximize her success with intensive lifestyle modifications for her multiple health conditions.   OBESITY BEHAVIORAL INTERVENTION VISIT  Today's visit was # 7 out of 22.  Starting weight: 198 lbs Starting date: 04/04/17 Today's weight : 189 lbs Today's date: 07/05/2017 Total lbs lost to date: 9 (Patients must lose 7 lbs in the first 6 months to continue with counseling)   ASK: We discussed the diagnosis of obesity with [name] today and Abrey agreed to give [place] permission to discuss obesity behavioral modification therapy today.  ASSESS: Bonney has the diagnosis of  obesity and her BMI today is 31.45 Chia is in the action stage of change   ADVISE: Amandamarie was educated on the  multiple health risks of obesity as well as the benefit of weight loss to improve her health. She was advised of the need for long term treatment and the importance of lifestyle modifications.  AGREE: Multiple dietary modification options and treatment options were discussed and  Thanh agreed to the above obesity treatment plan.  I, Doreene Nest, am acting as transcriptionist for Dennard Nip, MD  I have reviewed the above documentation for accuracy and completeness, and I agree with the above. -Dennard Nip, MD

## 2017-07-06 MED FILL — VIT D2 1.25 MG (50,000 UNIT: 1.25 MG | 28 days supply | Qty: 4 | Fill #0

## 2017-07-19 ENCOUNTER — Ambulatory Visit (INDEPENDENT_AMBULATORY_CARE_PROVIDER_SITE_OTHER): Payer: 59 | Admitting: Family Medicine

## 2017-07-19 VITALS — BP 111/77 | HR 69 | Temp 98.2°F | Ht 65.0 in | Wt 187.0 lb

## 2017-07-19 DIAGNOSIS — Z6831 Body mass index (BMI) 31.0-31.9, adult: Secondary | ICD-10-CM | POA: Diagnosis not present

## 2017-07-19 DIAGNOSIS — E559 Vitamin D deficiency, unspecified: Secondary | ICD-10-CM | POA: Diagnosis not present

## 2017-07-19 DIAGNOSIS — F3289 Other specified depressive episodes: Secondary | ICD-10-CM | POA: Diagnosis not present

## 2017-07-19 DIAGNOSIS — E669 Obesity, unspecified: Secondary | ICD-10-CM

## 2017-07-19 NOTE — Progress Notes (Signed)
Office: [phone]  /  Fax: [phone]   HPI:   Chief Complaint: OBESITY Melissa Atkinson is here to discuss her progress with her obesity treatment plan. She is on the Category 2 plan and is following her eating plan approximately 90 % of the time. She states she is exercising 0 minutes 0 times per week. Melissa Atkinson is adhering to Category 2 instead of Pescatarian secondary to fear of gaining weight for mixing meal plans. She feels tempted to stray off plan.  Her weight is 187 lb (84.8 kg) today and has had a weight loss of 2 pounds over a period of 2 weeks since her last visit. She has lost 11 lbs since starting treatment with [place].  Vitamin D deficiency Melissa Atkinson has a diagnosis of vitamin D deficiency. She is currently taking prescription Vit D and denies nausea, vomiting or muscle weakness.  Depression with emotional eating behaviors Melissa Atkinson is feeling good on Wellbutrin, she notes improvement in cravings. Melissa Atkinson struggles with emotional eating and using food for comfort to the extent that it is negatively impacting her health. She often snacks when she is not hungry. Melissa Atkinson sometimes feels she is out of control and then feels guilty that she made poor food choices. She has been working on behavior modification techniques to help reduce her emotional eating and has been somewhat successful. She shows no sign of suicidal or homicidal ideations.  Depression screen PHQ 2/9 04/04/2017  Decreased Interest 2  Down, Depressed, Hopeless 3  PHQ - 2 Score 5  Altered sleeping 0  Tired, decreased energy 3  Change in appetite 1  Feeling bad or failure about yourself  3  Trouble concentrating 0  Moving slowly or fidgety/restless 0  Suicidal thoughts 0  PHQ-9 Score 12  Difficult doing work/chores Not difficult at all   ALLERGIES: No Known Allergies  MEDICATIONS: Current Outpatient Medications on File Prior to Visit  Medication Sig Dispense Refill  . buPROPion (WELLBUTRIN SR) 150 MG 12 hr tablet Take 1 tablet (150 mg  total) by mouth 2 (two) times daily. 60 tablet 0  . metoprolol succinate (TOPROL-XL) 25 MG 24 hr tablet TAKE 1 TABLET (25 MG TOTAL) BY MOUTH DAILY. 90 tablet 3  . triamcinolone cream (KENALOG) 0.1 % Apply 096 application topically 2 (two) times daily. 454 g 11  . Vitamin D, Ergocalciferol, (DRISDOL) 50000 units CAPS capsule Take 1 capsule (50,000 Units total) by mouth every 7 (seven) days. 4 capsule 0   No current facility-administered medications on file prior to visit.     PAST MEDICAL HISTORY: Past Medical History:  Diagnosis Date  . Anxiety   . Back pain   . Bicuspid aortic valve   . Breast cancer ([place])   . Depression   . Joint pain   . Kidney cyst, acquired   . Obesity   . Palpitations   . Polycythemia vera ([place])   . Thoracic aortic aneurysm ([place])   . Thyroid nodule     PAST SURGICAL HISTORY: Past Surgical History:  Procedure Laterality Date  . DILITATION & CURRETTAGE/HYSTROSCOPY WITH THERMACHOICE ABLATION N/A 12/28/2012   Procedure: DILATATION & CURETTAGE/HYSTEROSCOPY WITH THERMACHOICE ABLATION;  Surgeon: Cyril Mourning, MD;  Location: [facility];  Service: Gynecology;  Laterality: N/A;  . MASTECTOMY     bilateral  . PARTIAL NEPHRECTOMY     RT   . TONSILLECTOMY    . TUBAL LIGATION      SOCIAL HISTORY: Social History   Tobacco Use  . Smoking status: Never  Smoker  . Smokeless tobacco: Never Used  Substance Use Topics  . Alcohol use: Yes    Comment: Occasional  . Drug use: No    FAMILY HISTORY: Family History  Problem Relation Age of Onset  . Cancer Mother        lung  . Depression Mother   . Anxiety disorder Mother   . Bipolar disorder Mother   . Alcoholism Mother   . COPD Father   . Congestive Heart Failure Father   . Depression Father   . Alcoholism Father   . Stroke Maternal Grandfather     ROS: Review of Systems  Constitutional: Positive for weight loss.  Gastrointestinal: Negative for nausea and vomiting.  Musculoskeletal:       Negative  muscle weakness  Psychiatric/Behavioral: Positive for depression. Negative for suicidal ideas.    PHYSICAL EXAM: Blood pressure 111/77, pulse 69, temperature 98.2 F (36.8 C), temperature source Oral, height 5\' 5"  (1.651 m), weight 187 lb (84.8 kg), last menstrual period 06/17/2017, SpO2 99 %. Body mass index is 31.12 kg/m. Physical Exam  Constitutional: She is oriented to person, place, and time. She appears well-developed and well-nourished.  Cardiovascular: Normal rate.  Pulmonary/Chest: Effort normal.  Musculoskeletal: Normal range of motion.  Neurological: She is oriented to person, place, and time.  Skin: Skin is warm and dry.  Psychiatric: She has a normal mood and affect. Her behavior is normal.  Vitals reviewed.   RECENT LABS AND TESTS: BMET    Component Value Date/Time   NA 140 04/04/2017 0935   K 4.2 04/04/2017 0935   CL 100 04/04/2017 0935   CO2 24 04/04/2017 0935   GLUCOSE 88 04/04/2017 0935   GLUCOSE 89 08/09/2016 1108   BUN 10 04/04/2017 0935   CREATININE 0.82 04/04/2017 0935   CREATININE 0.79 08/09/2016 1108   CALCIUM 9.4 04/04/2017 0935   GFRNONAA 85 04/04/2017 0935   GFRNONAA >89 05/07/2013 1030   GFRAA 99 04/04/2017 0935   GFRAA >89 05/07/2013 1030   Lab Results  Component Value Date   HGBA1C 5.2 04/04/2017   HGBA1C 5.0 08/09/2016   HGBA1C 5.2 07/12/2014   Lab Results  Component Value Date   INSULIN 9.1 04/04/2017   CBC    Component Value Date/Time   WBC 7.7 04/04/2017 0935   WBC 10.1 08/09/2016 1108   RBC 4.57 04/04/2017 0935   RBC 4.73 08/09/2016 1108   HGB 13.1 04/04/2017 0935   HCT 39.5 04/04/2017 0935   PLT 204 08/09/2016 1108   MCV 86 04/04/2017 0935   MCH 28.7 04/04/2017 0935   MCH 29.0 08/09/2016 1108   MCHC 33.2 04/04/2017 0935   MCHC 33.3 08/09/2016 1108   RDW 13.9 04/04/2017 0935   LYMPHSABS 1.7 04/04/2017 0935   EOSABS 0.4 04/04/2017 0935   BASOSABS 0.1 04/04/2017 0935   Iron/TIBC/Ferritin/ %Sat No results found for:  IRON, TIBC, FERRITIN, IRONPCTSAT Lipid Panel     Component Value Date/Time   CHOL 219 (H) 04/04/2017 0935   TRIG 161 (H) 04/04/2017 0935   HDL 86 04/04/2017 0935   CHOLHDL 2.1 08/09/2016 1108   VLDL 13 07/12/2014 0945   LDLCALC 101 (H) 04/04/2017 0935   Hepatic Function Panel     Component Value Date/Time   PROT 6.7 04/04/2017 0935   ALBUMIN 4.2 04/04/2017 0935   AST 13 04/04/2017 0935   ALT 12 04/04/2017 0935   ALKPHOS 86 04/04/2017 0935   BILITOT 0.4 04/04/2017 0935      Component  Value Date/Time   TSH 0.926 04/04/2017 0935   TSH 1.50 08/09/2016 1108   TSH 1.308 02/07/2015 1010  Results for BARBIE, CROSTON (MRN [id number]) as of 07/19/2017 17:44  Ref. Range 04/04/2017 09:35  Vitamin D, 25-Hydroxy Latest Ref Range: 30.0 - 100.0 ng/mL 19.2 (L)    ASSESSMENT AND PLAN: Vitamin D deficiency  Other depression - with emotional eating  Class 1 obesity with serious comorbidity and body mass index (BMI) of 31.0 to 31.9 in adult, unspecified obesity type  PLAN:  Vitamin D Deficiency Melissa Atkinson was informed that low vitamin D levels contributes to fatigue and are associated with obesity, breast, and colon cancer. Melissa Atkinson agrees to continue taking prescription Vit D @50 ,000 IU every week #4 and will follow up for routine testing of vitamin D, at least 2-3 times per year. She was informed of the risk of over-replacement of vitamin D and agrees to not increase her dose unless she discusses this with [place] first. Melissa Atkinson agrees to follow up with our clinic in 2 weeks.  Depression with Emotional Eating Behaviors We discussed behavior modification techniques today to help Melissa Atkinson deal with her emotional eating and depression. Melissa Atkinson agrees to continue taking Wellbutrin SR 150 mg BID and she agrees to follow up with our clinic in 2 weeks.  We spent > than 50% of the 15 minute visit on the counseling as documented in the note.  Obesity Melissa Atkinson is currently in the action stage of change. As such, her goal is to  continue with weight loss efforts She has agreed to change to keep a food journal with 450-550 calories and 35+ grams of protein at supper daily and follow the Category 2 plan Melissa Atkinson has been instructed to work up to a goal of 150 minutes of combined cardio and strengthening exercise per week for weight loss and overall health benefits. We discussed the following Behavioral Modification Strategies today: increasing lean protein intake, decreasing simple carbohydrates, and keep a strict food journal    Melissa Atkinson has agreed to follow up with our clinic in 2 weeks. She was informed of the importance of frequent follow up visits to maximize her success with intensive lifestyle modifications for her multiple health conditions.  We spent > than 50% of the 30 minute visit on the counseling as documented in the note.   OBESITY BEHAVIORAL INTERVENTION VISIT  Today's visit was # 8 out of 22.  Starting weight: 198 lbs Starting date: 04/04/17 Today's weight : 187 lbs  Today's date: 07/19/2017 Total lbs lost to date: 11 (Patients must lose 7 lbs in the first 6 months to continue with counseling)   ASK: We discussed the diagnosis of obesity with Melissa Atkinson today and Melissa Atkinson agreed to give [place] permission to discuss obesity behavioral modification therapy today.  ASSESS: Melissa Atkinson has the diagnosis of obesity and her BMI today is 31.12 [place] is in the action stage of change   ADVISE: Melissa Atkinson was educated on the multiple health risks of obesity as well as the benefit of weight loss to improve her health. She was advised of the need for long term treatment and the importance of lifestyle modifications.  AGREE: Multiple dietary modification options and treatment options were discussed and  Melissa Atkinson agreed to the above obesity treatment plan.  I, Trixie Dredge, am acting as transcriptionist for Dennard Nip, MD I have reviewed the above documentation for accuracy and completeness, and I agree with the above. -Dennard Nip, MD

## 2017-07-25 MED FILL — METOPROLOL SUCC ER 25 MG TA: 25 | 90 days supply | Qty: 90 | Fill #3

## 2017-07-25 MED FILL — BUPROPION SR 150 MG TABLET: 150 | 30 days supply | Qty: 60 | Fill #0

## 2017-08-03 ENCOUNTER — Telehealth: Payer: Self-pay | Admitting: Family Medicine

## 2017-08-03 MED ORDER — OSELTAMIVIR PHOSPHATE 75 MG PO CAPS: 75.0000 mg | ORAL_CAPSULE | Freq: Every day | ORAL | 0 refills | Status: DC

## 2017-08-03 NOTE — Telephone Encounter (Signed)
Father in the hospital with flu and pneumonia.  Son came in for office visit today and tested positive for influenza A as well.  We will go ahead and send in prophylactic dose.  Tamiflu sent to [facility].

## 2017-08-04 ENCOUNTER — Ambulatory Visit (INDEPENDENT_AMBULATORY_CARE_PROVIDER_SITE_OTHER): Payer: 59 | Admitting: Family Medicine

## 2017-08-04 VITALS — BP 118/80 | HR 65 | Temp 98.3°F | Ht 65.0 in | Wt 190.0 lb

## 2017-08-04 DIAGNOSIS — F3289 Other specified depressive episodes: Secondary | ICD-10-CM | POA: Diagnosis not present

## 2017-08-04 DIAGNOSIS — Z6831 Body mass index (BMI) 31.0-31.9, adult: Secondary | ICD-10-CM

## 2017-08-04 DIAGNOSIS — E669 Obesity, unspecified: Secondary | ICD-10-CM

## 2017-08-04 MED ORDER — TOPIRAMATE 25 MG PO TABS
25.0000 mg | ORAL_TABLET | Freq: Every day | ORAL | 0 refills | Status: DC
Start: 2017-08-04 — End: 2017-08-22

## 2017-08-04 MED FILL — TOPIRAMATE 25 MG TAB: 25 | 30 days supply | Qty: 30 | Fill #0

## 2017-08-04 NOTE — Progress Notes (Signed)
Office: [phone]  /  Fax: [phone]   HPI:   Chief Complaint: OBESITY Melissa Atkinson is here to discuss her progress with her obesity treatment plan. She is on the  keep a food journal with 450 to 550 calories and 35+ grams of protein at supper daily and follow the Category 2 plan and is following her eating plan approximately 100 % of the time. She states she is moving more and is riding her bike for 10 minutes 1 time per week. Melissa Atkinson is retaining fluid today, but has been sick and is struggling to journal for dinner. She has multiple sick family members and is off her normal routine. Her weight is 190 lb (86.2 kg) today and has had a weight gain of 3 pounds over a period of 2 weeks since her last visit. She has lost 8 lbs since starting treatment with [place].  Depression with emotional eating behaviors Melissa Atkinson is stable on Wellbutrin, but she is still struggling with emotional eating and is feeling frustrated that she is struggling. Melissa Atkinson struggles with emotional eating and using food for comfort to the extent that it is negatively impacting her health. She often snacks when she is not hungry. Melissa Atkinson sometimes feels she is out of control and then feels guilty that she made poor food choices. She has been working on behavior modification techniques to help reduce her emotional eating and has been somewhat successful. She shows no sign of suicidal or homicidal ideations.  Depression screen PHQ 2/9 04/04/2017  Decreased Interest 2  Down, Depressed, Hopeless 3  PHQ - 2 Score 5  Altered sleeping 0  Tired, decreased energy 3  Change in appetite 1  Feeling bad or failure about yourself  3  Trouble concentrating 0  Moving slowly or fidgety/restless 0  Suicidal thoughts 0  PHQ-9 Score 12  Difficult doing work/chores Not difficult at all      ALLERGIES: No Known Allergies  MEDICATIONS: Current Outpatient Medications on File Prior to Visit  Medication Sig Dispense Refill  . buPROPion (WELLBUTRIN SR)  150 MG 12 hr tablet Take 1 tablet (150 mg total) by mouth 2 (two) times daily. 60 tablet 0  . metoprolol succinate (TOPROL-XL) 25 MG 24 hr tablet TAKE 1 TABLET (25 MG TOTAL) BY MOUTH DAILY. 90 tablet 3  . oseltamivir (TAMIFLU) 75 MG capsule Take 1 capsule (75 mg total) by mouth daily. X 10 days 10 capsule 0  . triamcinolone cream (KENALOG) 0.1 % Apply 332 application topically 2 (two) times daily. 454 g 11  . Vitamin D, Ergocalciferol, (DRISDOL) 50000 units CAPS capsule Take 1 capsule (50,000 Units total) by mouth every 7 (seven) days. 4 capsule 0   No current facility-administered medications on file prior to visit.     PAST MEDICAL HISTORY: Past Medical History:  Diagnosis Date  . Anxiety   . Back pain   . Bicuspid aortic valve   . Breast cancer ([place])   . Depression   . Joint pain   . Kidney cyst, acquired   . Obesity   . Palpitations   . Polycythemia vera ([place])   . Thoracic aortic aneurysm ([place])   . Thyroid nodule     PAST SURGICAL HISTORY: Past Surgical History:  Procedure Laterality Date  . DILITATION & CURRETTAGE/HYSTROSCOPY WITH THERMACHOICE ABLATION N/A 12/28/2012   Procedure: DILATATION & CURETTAGE/HYSTEROSCOPY WITH THERMACHOICE ABLATION;  Surgeon: Cyril Mourning, MD;  Location: [facility];  Service: Gynecology;  Laterality: N/A;  . MASTECTOMY  bilateral  . PARTIAL NEPHRECTOMY     RT   . TONSILLECTOMY    . TUBAL LIGATION      SOCIAL HISTORY: Social History   Tobacco Use  . Smoking status: Never Smoker  . Smokeless tobacco: Never Used  Substance Use Topics  . Alcohol use: Yes    Comment: Occasional  . Drug use: No    FAMILY HISTORY: Family History  Problem Relation Age of Onset  . Cancer Mother        lung  . Depression Mother   . Anxiety disorder Mother   . Bipolar disorder Mother   . Alcoholism Mother   . COPD Father   . Congestive Heart Failure Father   . Depression Father   . Alcoholism Father   . Stroke Maternal Grandfather      ROS: Review of Systems  Constitutional: Negative for weight loss.  Psychiatric/Behavioral: Positive for depression. Negative for suicidal ideas.    PHYSICAL EXAM: Blood pressure 118/80, pulse 65, temperature 98.3 F (36.8 C), temperature source Oral, height 5\' 5"  (1.651 m), weight 190 lb (86.2 kg), last menstrual period 07/30/2017, SpO2 98 %. Body mass index is 31.62 kg/m. Physical Exam  Constitutional: She is oriented to person, place, and time. She appears well-developed and well-nourished.  Cardiovascular: Normal rate.  Pulmonary/Chest: Effort normal.  Musculoskeletal: Normal range of motion.  Neurological: She is oriented to person, place, and time.  Skin: Skin is warm and dry.  Psychiatric: She has a normal mood and affect. Her behavior is normal.  Vitals reviewed.   RECENT LABS AND TESTS: BMET    Component Value Date/Time   NA 140 04/04/2017 0935   K 4.2 04/04/2017 0935   CL 100 04/04/2017 0935   CO2 24 04/04/2017 0935   GLUCOSE 88 04/04/2017 0935   GLUCOSE 89 08/09/2016 1108   BUN 10 04/04/2017 0935   CREATININE 0.82 04/04/2017 0935   CREATININE 0.79 08/09/2016 1108   CALCIUM 9.4 04/04/2017 0935   GFRNONAA 85 04/04/2017 0935   GFRNONAA >89 05/07/2013 1030   GFRAA 99 04/04/2017 0935   GFRAA >89 05/07/2013 1030   Lab Results  Component Value Date   HGBA1C 5.2 04/04/2017   HGBA1C 5.0 08/09/2016   HGBA1C 5.2 07/12/2014   Lab Results  Component Value Date   INSULIN 9.1 04/04/2017   CBC    Component Value Date/Time   WBC 7.7 04/04/2017 0935   WBC 10.1 08/09/2016 1108   RBC 4.57 04/04/2017 0935   RBC 4.73 08/09/2016 1108   HGB 13.1 04/04/2017 0935   HCT 39.5 04/04/2017 0935   PLT 204 08/09/2016 1108   MCV 86 04/04/2017 0935   MCH 28.7 04/04/2017 0935   MCH 29.0 08/09/2016 1108   MCHC 33.2 04/04/2017 0935   MCHC 33.3 08/09/2016 1108   RDW 13.9 04/04/2017 0935   LYMPHSABS 1.7 04/04/2017 0935   EOSABS 0.4 04/04/2017 0935   BASOSABS 0.1  04/04/2017 0935   Iron/TIBC/Ferritin/ %Sat No results found for: IRON, TIBC, FERRITIN, IRONPCTSAT Lipid Panel     Component Value Date/Time   CHOL 219 (H) 04/04/2017 0935   TRIG 161 (H) 04/04/2017 0935   HDL 86 04/04/2017 0935   CHOLHDL 2.1 08/09/2016 1108   VLDL 13 07/12/2014 0945   LDLCALC 101 (H) 04/04/2017 0935   Hepatic Function Panel     Component Value Date/Time   PROT 6.7 04/04/2017 0935   ALBUMIN 4.2 04/04/2017 0935   AST 13 04/04/2017 0935   ALT 12  04/04/2017 0935   ALKPHOS 86 04/04/2017 0935   BILITOT 0.4 04/04/2017 0935      Component Value Date/Time   TSH 0.926 04/04/2017 0935   TSH 1.50 08/09/2016 1108   TSH 1.308 02/07/2015 1010    ASSESSMENT AND PLAN: Other depression - with emotional eating  - Plan: topiramate (TOPAMAX) 25 MG tablet  Class 1 obesity with serious comorbidity and body mass index (BMI) of 31.0 to 31.9 in adult, unspecified obesity type  PLAN:  Depression with Emotional Eating Behaviors We discussed behavior modification techniques today to help Melissa Atkinson deal with her emotional eating and depression. She has agreed to continue Wellbutrin SR 150 mg bid and start Topamax 25 mg qhs #30 with no refills. Melissa Atkinson has agreed to follow up as directed.  Obesity Melissa Atkinson is currently in the action stage of change. As such, her goal is to continue with weight loss efforts She has agreed to follow a straight Category 2 plan Melissa Atkinson has been instructed to work up to a goal of 150 minutes of combined cardio and strengthening exercise per week or continue moving more and ride bike for 10 minutes 1 time per week for weight loss and overall health benefits. We discussed the following Behavioral Modification Strategies today: increasing lean protein intake, decreasing simple carbohydrates , work on meal planning and easy cooking plans and emotional eating strategies  Melissa Atkinson has agreed to follow up with our clinic in 2 weeks. She was informed of the importance of frequent  follow up visits to maximize her success with intensive lifestyle modifications for her multiple health conditions.   OBESITY BEHAVIORAL INTERVENTION VISIT  Today's visit was # 9 out of 22.  Starting weight: 198 lbs Starting date: 04/04/17 Today's weight : 190 lbs Today's date: 08/04/2017 Total lbs lost to date: 8 (Patients must lose 7 lbs in the first 6 months to continue with counseling)   ASK: We discussed the diagnosis of obesity with Melissa Atkinson today and Melissa Atkinson agreed to give [place] permission to discuss obesity behavioral modification therapy today.  ASSESS: Melissa Atkinson has the diagnosis of obesity and her BMI today is 31.62 Melissa Atkinson is in the action stage of change   ADVISE: Melissa Atkinson was educated on the multiple health risks of obesity as well as the benefit of weight loss to improve her health. She was advised of the need for long term treatment and the importance of lifestyle modifications.  AGREE: Multiple dietary modification options and treatment options were discussed and  Melissa Atkinson agreed to the above obesity treatment plan.  I, Doreene Nest, am acting as transcriptionist for Dennard Nip, MD  I have reviewed the above documentation for accuracy and completeness, and I agree with the above. -Dennard Nip, MD

## 2017-08-22 ENCOUNTER — Ambulatory Visit (INDEPENDENT_AMBULATORY_CARE_PROVIDER_SITE_OTHER): Payer: 59 | Admitting: Family Medicine

## 2017-08-22 VITALS — BP 107/73 | HR 66 | Temp 97.8°F | Ht 65.0 in | Wt 183.0 lb

## 2017-08-22 DIAGNOSIS — F3289 Other specified depressive episodes: Secondary | ICD-10-CM

## 2017-08-22 DIAGNOSIS — E669 Obesity, unspecified: Secondary | ICD-10-CM | POA: Diagnosis not present

## 2017-08-22 DIAGNOSIS — E559 Vitamin D deficiency, unspecified: Secondary | ICD-10-CM | POA: Diagnosis not present

## 2017-08-22 DIAGNOSIS — Z6830 Body mass index (BMI) 30.0-30.9, adult: Secondary | ICD-10-CM

## 2017-08-22 DIAGNOSIS — E8881 Metabolic syndrome: Secondary | ICD-10-CM

## 2017-08-22 DIAGNOSIS — Z9189 Other specified personal risk factors, not elsewhere classified: Secondary | ICD-10-CM | POA: Diagnosis not present

## 2017-08-22 DIAGNOSIS — E7849 Other hyperlipidemia: Secondary | ICD-10-CM | POA: Diagnosis not present

## 2017-08-22 MED ORDER — VITAMIN D (ERGOCALCIFEROL) 1.25 MG (50000 UNIT) PO CAPS: 50000.0000 [IU] | ORAL_CAPSULE | ORAL | 0 refills | Status: DC

## 2017-08-22 MED ORDER — TOPIRAMATE 25 MG PO TABS: 25.0000 mg | ORAL_TABLET | Freq: Every day | ORAL | 0 refills | Status: DC

## 2017-08-22 MED ORDER — BUPROPION HCL ER (SR) 150 MG PO TB12: 150.0000 mg | ORAL_TABLET | Freq: Two times a day (BID) | ORAL | 0 refills | Status: DC

## 2017-08-22 MED FILL — BUPROPION SR 150 MG TABLET: 150 | 30 days supply | Qty: 60 | Fill #0

## 2017-08-22 MED FILL — VIT D2 1.25 MG (50,000 UNIT: 1.25 MG | 28 days supply | Qty: 4 | Fill #0

## 2017-08-22 NOTE — Progress Notes (Signed)
Office: [phone]  /  Fax: [phone]   HPI:   Chief Complaint: OBESITY Melissa Atkinson is here to discuss her progress with her obesity treatment plan. She is on the Category 2 plan and is following her eating plan approximately 90 % of the time. She states she is exercising 0 minutes 0 times per week. Melissa Atkinson has gotten back to the category 2 plan and is doing better with planning meals ahead of time. Hunger is controlled. She is still deviating from the plan at dinnertime. Her weight is 183 lb (83 kg) today and has had a weight loss of 7 pounds over a period of 2 weeks since her last visit. She has lost 15 lbs since starting treatment with [place].  Vitamin D deficiency Melissa Atkinson has a diagnosis of vitamin D deficiency. She is stable on prescription vit D and is due for labs. Melissa Atkinson denies nausea, vomiting or muscle weakness.   Ref. Range 04/04/2017 09:35  Vitamin D, 25-Hydroxy Latest Ref Range: 30.0 - 100.0 ng/mL 19.2 (L)   Hyperlipidemia Melissa Atkinson has hyperlipidemia and has been trying to control her cholesterol levels with intensive lifestyle modification including a low saturated fat diet, exercise and weight loss. She is doing well on diet prescription and with weight loss. She denies any chest pain, claudication or myalgias.  Insulin Resistance Melissa Atkinson has a diagnosis of insulin resistance and her last fasting insulin level was >5 Although Melissa Atkinson's blood glucose readings are still under good control, insulin resistance puts her at greater risk of metabolic syndrome and diabetes. Polyphagia improved on the low simple carbohydrate diet. Melissa Atkinson is not taking metformin currently and she is attempting to control her insulin resistance with diet and exercise to decrease risk of diabetes.  At risk for diabetes Melissa Atkinson is at higher than average risk for developing diabetes due to her obesity and insulin resistance. She currently denies polyuria or polydipsia.  Depression with emotional eating behaviors Melissa Atkinson mood is  stable on medications. She recently started Topamax and she feels she is doing better with decreasing emotional eating, especially in the evening. She has no daytime somnolence. Her last menstrual period is today. Melissa Atkinson struggles with emotional eating and using food for comfort to the extent that it is negatively impacting her health. She often snacks when she is not hungry. Melissa Atkinson sometimes feels she is out of control and then feels guilty that she made poor food choices. She has been working on behavior modification techniques to help reduce her emotional eating and has been somewhat successful. She shows no sign of suicidal or homicidal ideations.  Depression screen PHQ 2/9 04/04/2017  Decreased Interest 2  Down, Depressed, Hopeless 3  PHQ - 2 Score 5  Altered sleeping 0  Tired, decreased energy 3  Change in appetite 1  Feeling bad or failure about yourself  3  Trouble concentrating 0  Moving slowly or fidgety/restless 0  Suicidal thoughts 0  PHQ-9 Score 12  Difficult doing work/chores Not difficult at all        ALLERGIES: No Known Allergies  MEDICATIONS: Current Outpatient Medications on File Prior to Visit  Medication Sig Dispense Refill  . metoprolol succinate (TOPROL-XL) 25 MG 24 hr tablet TAKE 1 TABLET (25 MG TOTAL) BY MOUTH DAILY. 90 tablet 3  . triamcinolone cream (KENALOG) 0.1 % Apply 024 application topically 2 (two) times daily. 454 g 11   No current facility-administered medications on file prior to visit.     PAST MEDICAL HISTORY: Past Medical History:  Diagnosis  Date  . Anxiety   . Back pain   . Bicuspid aortic valve   . Breast cancer ([place])   . Depression   . Joint pain   . Kidney cyst, acquired   . Obesity   . Palpitations   . Polycythemia vera ([place])   . Thoracic aortic aneurysm ([place])   . Thyroid nodule     PAST SURGICAL HISTORY: Past Surgical History:  Procedure Laterality Date  . DILITATION & CURRETTAGE/HYSTROSCOPY WITH THERMACHOICE ABLATION N/A  12/28/2012   Procedure: DILATATION & CURETTAGE/HYSTEROSCOPY WITH THERMACHOICE ABLATION;  Surgeon: Cyril Mourning, MD;  Location: [facility];  Service: Gynecology;  Laterality: N/A;  . MASTECTOMY     bilateral  . PARTIAL NEPHRECTOMY     RT   . TONSILLECTOMY    . TUBAL LIGATION      SOCIAL HISTORY: Social History   Tobacco Use  . Smoking status: Never Smoker  . Smokeless tobacco: Never Used  Substance Use Topics  . Alcohol use: Yes    Comment: Occasional  . Drug use: No    FAMILY HISTORY: Family History  Problem Relation Age of Onset  . Cancer Mother        lung  . Depression Mother   . Anxiety disorder Mother   . Bipolar disorder Mother   . Alcoholism Mother   . COPD Father   . Congestive Heart Failure Father   . Depression Father   . Alcoholism Father   . Stroke Maternal Grandfather     ROS: Review of Systems  Constitutional: Positive for weight loss.  Cardiovascular: Negative for chest pain and claudication.  Gastrointestinal: Negative for nausea and vomiting.  Genitourinary: Negative for frequency.  Musculoskeletal: Negative for myalgias.       Negative for muscle weakness  Endo/Heme/Allergies: Negative for polydipsia.  Psychiatric/Behavioral: Positive for depression. Negative for suicidal ideas.    PHYSICAL EXAM: Blood pressure 107/73, pulse 66, temperature 97.8 F (36.6 C), temperature source Oral, height 5\' 5"  (1.651 m), weight 183 lb (83 kg), last menstrual period 07/30/2017, SpO2 99 %. Body mass index is 30.45 kg/m. Physical Exam  Constitutional: She is oriented to person, place, and time. She appears well-developed and well-nourished.  Cardiovascular: Normal rate.  Pulmonary/Chest: Effort normal.  Musculoskeletal: Normal range of motion.  Neurological: She is oriented to person, place, and time.  Skin: Skin is warm and dry.  Psychiatric: She has a normal mood and affect. Her behavior is normal.  Vitals reviewed.   RECENT LABS AND TESTS: BMET      Component Value Date/Time   NA 140 04/04/2017 0935   K 4.2 04/04/2017 0935   CL 100 04/04/2017 0935   CO2 24 04/04/2017 0935   GLUCOSE 88 04/04/2017 0935   GLUCOSE 89 08/09/2016 1108   BUN 10 04/04/2017 0935   CREATININE 0.82 04/04/2017 0935   CREATININE 0.79 08/09/2016 1108   CALCIUM 9.4 04/04/2017 0935   GFRNONAA 85 04/04/2017 0935   GFRNONAA >89 05/07/2013 1030   GFRAA 99 04/04/2017 0935   GFRAA >89 05/07/2013 1030   Lab Results  Component Value Date   HGBA1C 5.2 04/04/2017   HGBA1C 5.0 08/09/2016   HGBA1C 5.2 07/12/2014   Lab Results  Component Value Date   INSULIN 9.1 04/04/2017   CBC    Component Value Date/Time   WBC 7.7 04/04/2017 0935   WBC 10.1 08/09/2016 1108   RBC 4.57 04/04/2017 0935   RBC 4.73 08/09/2016 1108   HGB 13.1 04/04/2017 0935  HCT 39.5 04/04/2017 0935   PLT 204 08/09/2016 1108   MCV 86 04/04/2017 0935   MCH 28.7 04/04/2017 0935   MCH 29.0 08/09/2016 1108   MCHC 33.2 04/04/2017 0935   MCHC 33.3 08/09/2016 1108   RDW 13.9 04/04/2017 0935   LYMPHSABS 1.7 04/04/2017 0935   EOSABS 0.4 04/04/2017 0935   BASOSABS 0.1 04/04/2017 0935   Iron/TIBC/Ferritin/ %Sat No results found for: IRON, TIBC, FERRITIN, IRONPCTSAT Lipid Panel     Component Value Date/Time   CHOL 219 (H) 04/04/2017 0935   TRIG 161 (H) 04/04/2017 0935   HDL 86 04/04/2017 0935   CHOLHDL 2.1 08/09/2016 1108   VLDL 13 07/12/2014 0945   LDLCALC 101 (H) 04/04/2017 0935   Hepatic Function Panel     Component Value Date/Time   PROT 6.7 04/04/2017 0935   ALBUMIN 4.2 04/04/2017 0935   AST 13 04/04/2017 0935   ALT 12 04/04/2017 0935   ALKPHOS 86 04/04/2017 0935   BILITOT 0.4 04/04/2017 0935      Component Value Date/Time   TSH 0.926 04/04/2017 0935   TSH 1.50 08/09/2016 1108   TSH 1.308 02/07/2015 1010    ASSESSMENT AND PLAN: Vitamin D deficiency - Plan: VITAMIN D 25 Hydroxy (Vit-D Deficiency, Fractures), Vitamin D, Ergocalciferol, (DRISDOL) 50000 units CAPS  capsule  Other hyperlipidemia - Plan: Lipid Panel With LDL/HDL Ratio  Insulin resistance - Plan: Comprehensive metabolic panel, Hemoglobin A1c, Insulin, random  Other depression - with emotional eating - Plan: topiramate (TOPAMAX) 25 MG tablet, buPROPion (WELLBUTRIN SR) 150 MG 12 hr tablet  At risk for diabetes mellitus  Class 1 obesity with serious comorbidity and body mass index (BMI) of 30.0 to 30.9 in adult, unspecified obesity type  PLAN:  Vitamin D Deficiency Melissa Atkinson was informed that low vitamin D levels contributes to fatigue and are associated with obesity, breast, and colon cancer. She agrees to continue to take prescription Vit D @50 ,000 IU every week #4 with no refills and will follow up for routine testing of vitamin D, at least 2-3 times per year. She was informed of the risk of over-replacement of vitamin D and agrees to not increase her dose unless she discusses this with [place] first. We will check labs and Melissa Atkinson agrees to follow up with our clinic in 2 weeks.  Hyperlipidemia Melissa Atkinson was informed of the American Heart Association Guidelines emphasizing intensive lifestyle modifications as the first line treatment for hyperlipidemia. We discussed many lifestyle modifications today in depth, and Melissa Atkinson will continue to work on decreasing saturated fats such as fatty red meat, butter and many fried foods. She will also increase vegetables and lean protein in her diet and continue to work on exercise and weight loss efforts. We will check labs and follow.  Insulin Resistance Melissa Atkinson will continue to work on weight loss, exercise, and decreasing simple carbohydrates in her diet to help decrease the risk of diabetes. She was informed that eating too many simple carbohydrates or too many calories at one sitting increases the likelihood of GI side effects. We will check labs and Melissa Atkinson agreed to follow up with [place] as directed to monitor her progress.  Diabetes risk counseling Melissa Atkinson was given extended  (15 minutes) diabetes prevention counseling today. She is 47 y.o. female and has risk factors for diabetes including obesity and insulin resistance. We discussed intensive lifestyle modifications today with an emphasis on weight loss as well as increasing exercise and decreasing simple carbohydrates in her diet.  Depression with Emotional Eating  Behaviors We discussed behavior modification techniques today to help Melissa Atkinson deal with her emotional eating and depression. She has agreed to continue Wellbutrin SR 150 mg qd #30 with no refills and Topiramate 25 mg qhs #30 with no refills and follow up as directed.  Obesity Melissa Atkinson is currently in the action stage of change. As such, her goal is to continue with weight loss efforts She has agreed to follow the Category 2 plan Melissa Atkinson has been instructed to work up to a goal of 150 minutes of combined cardio and strengthening exercise per week for weight loss and overall health benefits. We discussed the following Behavioral Modification Strategies today: increasing lean protein intake and decreasing simple carbohydrates   Melissa Atkinson has agreed to follow up with our clinic in 2 weeks. She was informed of the importance of frequent follow up visits to maximize her success with intensive lifestyle modifications for her multiple health conditions.   OBESITY BEHAVIORAL INTERVENTION VISIT  Today's visit was # 10 out of 22.  Starting weight: 198 lbs Starting date: 04/04/17 Today's weight : 183 lbs Today's date: 08/22/2017 Total lbs lost to date: 15 (Patients must lose 7 lbs in the first 6 months to continue with counseling)   ASK: We discussed the diagnosis of obesity with Melissa Atkinson today and Melissa Atkinson agreed to give [place] permission to discuss obesity behavioral modification therapy today.  ASSESS: Melissa Atkinson has the diagnosis of obesity and her BMI today is 30.45 Melissa Atkinson is in the action stage of change   ADVISE: Melissa Atkinson was educated on the multiple health risks of obesity  as well as the benefit of weight loss to improve her health. She was advised of the need for long term treatment and the importance of lifestyle modifications.  AGREE: Multiple dietary modification options and treatment options were discussed and  Nabria agreed to the above obesity treatment plan.  I, Doreene Nest, am acting as transcriptionist for Dennard Nip, MD  I have reviewed the above documentation for accuracy and completeness, and I agree with the above. -Dennard Nip, MD

## 2017-08-23 LAB — HEMOGLOBIN A1C
Est. average glucose Bld gHb Est-mCnc: 103 mg/dL
Hgb A1c MFr Bld: 5.2 % (ref 4.8–5.6)

## 2017-08-23 LAB — LIPID PANEL WITH LDL/HDL RATIO
Cholesterol, Total: 217 mg/dL — ABNORMAL HIGH (ref 100–199)
HDL: 88 mg/dL (ref >39)
LDL Calculated: 110 mg/dL — ABNORMAL HIGH (ref 0–99)
LDl/HDL Ratio: 1.3 ratio (ref 0.0–3.2)
Triglycerides: 96 mg/dL (ref 0–149)
VLDL Cholesterol Cal: 19 mg/dL (ref 5–40)

## 2017-08-23 LAB — COMPREHENSIVE METABOLIC PANEL
ALT: 9 IU/L (ref 0–32)
AST: 9 IU/L (ref 0–40)
Albumin/Globulin Ratio: 1.8 (ref 1.2–2.2)
Albumin: 4.4 g/dL (ref 3.5–5.5)
Alkaline Phosphatase: 94 IU/L (ref 39–117)
BUN/Creatinine Ratio: 13 (ref 9–23)
BUN: 12 mg/dL (ref 6–24)
Bilirubin Total: <0.2 mg/dL (ref 0.0–1.2)
CO2: 24 mmol/L (ref 20–29)
Calcium: 8.8 mg/dL (ref 8.7–10.2)
Chloride: 102 mmol/L (ref 96–106)
Creatinine, Ser: 0.93 mg/dL (ref 0.57–1.00)
GFR calc Af Amer: 84 mL/min/{1.73_m2} (ref >59)
GFR calc non Af Amer: 73 mL/min/{1.73_m2} (ref >59)
Globulin, Total: 2.5 g/dL (ref 1.5–4.5)
Glucose: 95 mg/dL (ref 65–99)
Potassium: 3.8 mmol/L (ref 3.5–5.2)
Sodium: 142 mmol/L (ref 134–144)
Total Protein: 6.9 g/dL (ref 6.0–8.5)

## 2017-08-23 LAB — INSULIN, RANDOM: INSULIN: 10.1 u[IU]/mL (ref 2.6–24.9)

## 2017-08-23 LAB — VITAMIN D 25 HYDROXY (VIT D DEFICIENCY, FRACTURES): Vit D, 25-Hydroxy: 32.7 ng/mL (ref 30.0–100.0)

## 2017-08-31 ENCOUNTER — Encounter: Payer: Self-pay | Admitting: *Deleted

## 2017-08-31 ENCOUNTER — Other Ambulatory Visit: Payer: Self-pay | Admitting: *Deleted

## 2017-08-31 DIAGNOSIS — I712 Thoracic aortic aneurysm, without rupture, unspecified: Secondary | ICD-10-CM

## 2017-09-01 MED FILL — TOPIRAMATE 25 MG TAB: 25 | 30 days supply | Qty: 30 | Fill #0

## 2017-09-13 ENCOUNTER — Telehealth: Payer: Self-pay | Admitting: Cardiology

## 2017-09-13 ENCOUNTER — Ambulatory Visit (INDEPENDENT_AMBULATORY_CARE_PROVIDER_SITE_OTHER): Payer: 59 | Admitting: Physician Assistant

## 2017-09-13 NOTE — Telephone Encounter (Signed)
Closed Encounter  °

## 2017-09-14 ENCOUNTER — Encounter (INDEPENDENT_AMBULATORY_CARE_PROVIDER_SITE_OTHER): Payer: Self-pay

## 2017-09-14 ENCOUNTER — Ambulatory Visit (INDEPENDENT_AMBULATORY_CARE_PROVIDER_SITE_OTHER): Payer: 59 | Admitting: Family Medicine

## 2017-09-28 ENCOUNTER — Ambulatory Visit (INDEPENDENT_AMBULATORY_CARE_PROVIDER_SITE_OTHER): Payer: 59 | Admitting: Physician Assistant

## 2017-09-28 VITALS — BP 115/82 | HR 80 | Temp 98.2°F | Ht 65.0 in | Wt 186.0 lb

## 2017-09-28 DIAGNOSIS — E559 Vitamin D deficiency, unspecified: Secondary | ICD-10-CM

## 2017-09-28 DIAGNOSIS — Z6831 Body mass index (BMI) 31.0-31.9, adult: Secondary | ICD-10-CM | POA: Diagnosis not present

## 2017-09-28 DIAGNOSIS — F3289 Other specified depressive episodes: Secondary | ICD-10-CM

## 2017-09-28 DIAGNOSIS — E669 Obesity, unspecified: Secondary | ICD-10-CM | POA: Diagnosis not present

## 2017-09-28 DIAGNOSIS — Z9189 Other specified personal risk factors, not elsewhere classified: Secondary | ICD-10-CM

## 2017-09-28 MED ORDER — BUPROPION HCL ER (SR) 150 MG PO TB12: 150.0000 mg | ORAL_TABLET | Freq: Two times a day (BID) | ORAL | 0 refills | Status: DC

## 2017-09-28 MED ORDER — TOPIRAMATE 25 MG PO TABS: 25.0000 mg | ORAL_TABLET | Freq: Every day | ORAL | 0 refills | Status: DC

## 2017-09-28 MED ORDER — VITAMIN D (ERGOCALCIFEROL) 1.25 MG (50000 UNIT) PO CAPS: 50000.0000 [IU] | ORAL_CAPSULE | ORAL | 0 refills | Status: DC

## 2017-09-28 MED FILL — TOPIRAMATE 25 MG TAB: 25 | 30 days supply | Qty: 30 | Fill #0

## 2017-09-28 MED FILL — BUPROPION SR 150 MG TABLET: 150 | 30 days supply | Qty: 60 | Fill #0

## 2017-09-28 MED FILL — VIT D2 1.25 MG (50,000 UNIT: 1.25 MG | 28 days supply | Qty: 4 | Fill #0

## 2017-09-28 NOTE — Progress Notes (Signed)
Office: [phone]  /  Fax: [phone]   HPI:   Chief Complaint: OBESITY Melissa Atkinson is here to discuss her progress with her obesity treatment plan. She is on the Category 2 plan and is following her eating plan approximately 85 % of the time. She states she is exercising 20 minutes 3 times per week. Melissa Atkinson has deviated with her eating but is now motivated to get back on track and continue with weight loss.   Her weight is 186 lb (84.4 kg) today and has had a weight loss of 3 pounds over a period of 5 weeks since her last visit. She has lost 12 lbs since starting treatment with [place].  Vitamin D Deficiency Melissa Atkinson has a diagnosis of vitamin D deficiency. She is currently taking prescription Vit D and denies nausea, vomiting or muscle weakness.  At risk for osteopenia and osteoporosis Melissa Atkinson is at higher risk of osteopenia and osteoporosis due to vitamin D deficiency.   Depression with emotional eating behaviors Melissa Atkinson is struggling with emotional eating and using food for comfort to the extent that it is negatively impacting her health. She often snacks when she is not hungry. Melissa Atkinson sometimes feels she is out of control and then feels guilty that she made poor food choices. She has been working on behavior modification techniques to help reduce her emotional eating and has been somewhat successful. Her mood is stable and she shows no sign of suicidal or homicidal ideations.  Depression screen PHQ 2/9 04/04/2017  Decreased Interest 2  Down, Depressed, Hopeless 3  PHQ - 2 Score 5  Altered sleeping 0  Tired, decreased energy 3  Change in appetite 1  Feeling bad or failure about yourself  3  Trouble concentrating 0  Moving slowly or fidgety/restless 0  Suicidal thoughts 0  PHQ-9 Score 12  Difficult doing work/chores Not difficult at all   ALLERGIES: No Known Allergies  MEDICATIONS: Current Outpatient Medications on File Prior to Visit  Medication Sig Dispense Refill  . metoprolol succinate  (TOPROL-XL) 25 MG 24 hr tablet TAKE 1 TABLET (25 MG TOTAL) BY MOUTH DAILY. 90 tablet 3  . triamcinolone cream (KENALOG) 0.1 % Apply 993 application topically 2 (two) times daily. 454 g 11   No current facility-administered medications on file prior to visit.     PAST MEDICAL HISTORY: Past Medical History:  Diagnosis Date  . Anxiety   . Back pain   . Bicuspid aortic valve   . Breast cancer ([place])   . Depression   . Joint pain   . Kidney cyst, acquired   . Obesity   . Palpitations   . Polycythemia vera ([place])   . Thoracic aortic aneurysm ([place])   . Thyroid nodule     PAST SURGICAL HISTORY: Past Surgical History:  Procedure Laterality Date  . DILITATION & CURRETTAGE/HYSTROSCOPY WITH THERMACHOICE ABLATION N/A 12/28/2012   Procedure: DILATATION & CURETTAGE/HYSTEROSCOPY WITH THERMACHOICE ABLATION;  Surgeon: Cyril Mourning, MD;  Location: [facility];  Service: Gynecology;  Laterality: N/A;  . MASTECTOMY     bilateral  . PARTIAL NEPHRECTOMY     RT   . TONSILLECTOMY    . TUBAL LIGATION      SOCIAL HISTORY: Social History   Tobacco Use  . Smoking status: Never Smoker  . Smokeless tobacco: Never Used  Substance Use Topics  . Alcohol use: Yes    Comment: Occasional  . Drug use: No    FAMILY HISTORY: Family History  Problem Relation Age of  Onset  . Cancer Mother        lung  . Depression Mother   . Anxiety disorder Mother   . Bipolar disorder Mother   . Alcoholism Mother   . COPD Father   . Congestive Heart Failure Father   . Depression Father   . Alcoholism Father   . Stroke Maternal Grandfather     ROS: Review of Systems  Constitutional: Negative for weight loss.  Gastrointestinal: Negative for nausea and vomiting.  Musculoskeletal:       Negative muscle weakness  Psychiatric/Behavioral: Positive for depression. Negative for suicidal ideas.    PHYSICAL EXAM: Blood pressure 115/82, pulse 80, temperature 98.2 F (36.8 C), temperature source Oral, height 5\' 5"   (1.651 m), weight 186 lb (84.4 kg), SpO2 98 %. Body mass index is 30.95 kg/m. Physical Exam  Constitutional: She is oriented to person, place, and time. She appears well-developed and well-nourished.  Cardiovascular: Normal rate.  Pulmonary/Chest: Effort normal.  Musculoskeletal: Normal range of motion.  Neurological: She is oriented to person, place, and time.  Skin: Skin is warm and dry.  Psychiatric: She has a normal mood and affect. Her behavior is normal.  Vitals reviewed.   RECENT LABS AND TESTS: BMET    Component Value Date/Time   NA 142 08/22/2017 0826   K 3.8 08/22/2017 0826   CL 102 08/22/2017 0826   CO2 24 08/22/2017 0826   GLUCOSE 95 08/22/2017 0826   GLUCOSE 89 08/09/2016 1108   BUN 12 08/22/2017 0826   CREATININE 0.93 08/22/2017 0826   CREATININE 0.79 08/09/2016 1108   CALCIUM 8.8 08/22/2017 0826   GFRNONAA 73 08/22/2017 0826   GFRNONAA >89 05/07/2013 1030   GFRAA 84 08/22/2017 0826   GFRAA >89 05/07/2013 1030   Lab Results  Component Value Date   HGBA1C 5.2 08/22/2017   HGBA1C 5.2 04/04/2017   HGBA1C 5.0 08/09/2016   HGBA1C 5.2 07/12/2014   Lab Results  Component Value Date   INSULIN 10.1 08/22/2017   INSULIN 9.1 04/04/2017   CBC    Component Value Date/Time   WBC 7.7 04/04/2017 0935   WBC 10.1 08/09/2016 1108   RBC 4.57 04/04/2017 0935   RBC 4.73 08/09/2016 1108   HGB 13.1 04/04/2017 0935   HCT 39.5 04/04/2017 0935   PLT 204 08/09/2016 1108   MCV 86 04/04/2017 0935   MCH 28.7 04/04/2017 0935   MCH 29.0 08/09/2016 1108   MCHC 33.2 04/04/2017 0935   MCHC 33.3 08/09/2016 1108   RDW 13.9 04/04/2017 0935   LYMPHSABS 1.7 04/04/2017 0935   EOSABS 0.4 04/04/2017 0935   BASOSABS 0.1 04/04/2017 0935   Iron/TIBC/Ferritin/ %Sat No results found for: IRON, TIBC, FERRITIN, IRONPCTSAT Lipid Panel     Component Value Date/Time   CHOL 217 (H) 08/22/2017 0826   TRIG 96 08/22/2017 0826   HDL 88 08/22/2017 0826   CHOLHDL 2.1 08/09/2016 1108   VLDL  13 07/12/2014 0945   LDLCALC 110 (H) 08/22/2017 0826   Hepatic Function Panel     Component Value Date/Time   PROT 6.9 08/22/2017 0826   ALBUMIN 4.4 08/22/2017 0826   AST 9 08/22/2017 0826   ALT 9 08/22/2017 0826   ALKPHOS 94 08/22/2017 0826   BILITOT <0.2 08/22/2017 0826      Component Value Date/Time   TSH 0.926 04/04/2017 0935   TSH 1.50 08/09/2016 1108   TSH 1.308 02/07/2015 1010  Results for SHANTELLE, ALLES (MRN [id number]) as of 09/28/2017 16:32  Ref. Range 08/22/2017 08:26  Vitamin D, 25-Hydroxy Latest Ref Range: 30.0 - 100.0 ng/mL 32.7    ASSESSMENT AND PLAN: Vitamin D deficiency - Plan: Vitamin D, Ergocalciferol, (DRISDOL) 50000 units CAPS capsule  Other depression - with emotional eating - Plan: buPROPion (WELLBUTRIN SR) 150 MG 12 hr tablet, topiramate (TOPAMAX) 25 MG tablet  At risk for osteoporosis  Class 1 obesity with serious comorbidity and body mass index (BMI) of 31.0 to 31.9 in adult, unspecified obesity type  PLAN:  Vitamin D Deficiency Messiah was informed that low vitamin D levels contributes to fatigue and are associated with obesity, breast, and colon cancer. Dolorez agrees to continue taking prescription Vit D @50 ,000 IU every week #4 and we will refill for 1 month. She will follow up for routine testing of vitamin D, at least 2-3 times per year. She was informed of the risk of over-replacement of vitamin D and agrees to not increase her dose unless she discusses this with [place] first. Rosalynd agrees to follow up with our clinic in 2 weeks.  At risk for osteopenia and osteoporosis Wilhelmenia is at risk for osteopenia and osteoporsis due to her vitamin D deficiency. She was encouraged to take her vitamin D and follow her higher calcium diet and increase strengthening exercise to help strengthen her bones and decrease her risk of osteopenia and osteoporosis.  Depression with Emotional Eating Behaviors We discussed behavior modification techniques today to help Layah deal with  her emotional eating and depression. Breklyn agrees to continue taking Wellbutrin SR 150 mg BIDS #60 and we will refill for 1 month and she agrees to continue taking topiramate 25 mg qd #30 and we will refill for 1 month. Viki agrees to follow up with our clinic in 2 weeks.  Obesity Tenaya is currently in the action stage of change. As such, her goal is to get back to weightloss efforts  She has agreed to follow the Category 2 plan Avalynne has been instructed to work up to a goal of 150 minutes of combined cardio and strengthening exercise per week for weight loss and overall health benefits. We discussed the following Behavioral Modification Strategies today: increasing lean protein intake and work on meal planning and easy cooking plans   Mary-Anne has agreed to follow up with our clinic in 2 weeks. She was informed of the importance of frequent follow up visits to maximize her success with intensive lifestyle modifications for her multiple health conditions.   OBESITY BEHAVIORAL INTERVENTION VISIT  Today's visit was # 11 out of 22.  Starting weight: 198 lbs Starting date: 04/04/17 Today's weight : 186 lbs  Today's date: 09/28/2017 Total lbs lost to date: 12 (Patients must lose 7 lbs in the first 6 months to continue with counseling)   ASK: We discussed the diagnosis of obesity with [name] today and Starlina agreed to give [place] permission to discuss obesity behavioral modification therapy today.  ASSESS: Jerome has the diagnosis of obesity and her BMI today is 30.95 Kysha is in the action stage of change   ADVISE: Mimie was educated on the multiple health risks of obesity as well as the benefit of weight loss to improve her health. She was advised of the need for long term treatment and the importance of lifestyle modifications.  AGREE: Multiple dietary modification options and treatment options were discussed and  Kashonda agreed to the above obesity treatment plan.   Wilhemena Durie, am acting  as transcriptionist for [employer], PA-C  ILacy Duverney [facility], have reviewed this note and agree with its content

## 2017-10-12 ENCOUNTER — Ambulatory Visit (INDEPENDENT_AMBULATORY_CARE_PROVIDER_SITE_OTHER): Payer: 59 | Admitting: Physician Assistant

## 2017-10-12 VITALS — BP 112/79 | HR 75 | Temp 97.9°F | Ht 65.0 in | Wt 187.0 lb

## 2017-10-12 DIAGNOSIS — Z6831 Body mass index (BMI) 31.0-31.9, adult: Secondary | ICD-10-CM | POA: Diagnosis not present

## 2017-10-12 DIAGNOSIS — E559 Vitamin D deficiency, unspecified: Secondary | ICD-10-CM | POA: Diagnosis not present

## 2017-10-12 DIAGNOSIS — E669 Obesity, unspecified: Secondary | ICD-10-CM

## 2017-10-12 NOTE — Progress Notes (Signed)
Office: [phone]  /  Fax: [phone]   HPI:   Chief Complaint: OBESITY Melissa Atkinson is here to discuss her progress with her obesity treatment plan. She is on the Category 2 plan and is following her eating plan approximately 90 % of the time. She states she is bike riding and walking for 10-30 minutes 3 times per week. Melissa Atkinson is mindful of her eating but struggles to get all protein on the meal plan. She would like celebration eating strategies.  Her weight is 187 lb (84.8 kg) today and has gained 1 pound since her last visit. She has lost 11 lbs since starting treatment with [place].  Vitamin D Deficiency Melissa Atkinson has a diagnosis of vitamin D deficiency. She is currently taking prescription Vit D and denies nausea, vomiting or muscle weakness.  ALLERGIES: No Known Allergies  MEDICATIONS: Current Outpatient Medications on File Prior to Visit  Medication Sig Dispense Refill  . buPROPion (WELLBUTRIN SR) 150 MG 12 hr tablet Take 1 tablet (150 mg total) by mouth 2 (two) times daily. 60 tablet 0  . metoprolol succinate (TOPROL-XL) 25 MG 24 hr tablet TAKE 1 TABLET (25 MG TOTAL) BY MOUTH DAILY. 90 tablet 3  . topiramate (TOPAMAX) 25 MG tablet Take 1 tablet (25 mg total) by mouth at bedtime. 30 tablet 0  . triamcinolone cream (KENALOG) 0.1 % Apply 481 application topically 2 (two) times daily. 454 g 11  . Vitamin D, Ergocalciferol, (DRISDOL) 50000 units CAPS capsule Take 1 capsule (50,000 Units total) by mouth every 7 (seven) days. 4 capsule 0   No current facility-administered medications on file prior to visit.     PAST MEDICAL HISTORY: Past Medical History:  Diagnosis Date  . Anxiety   . Back pain   . Bicuspid aortic valve   . Breast cancer ([place])   . Depression   . Joint pain   . Kidney cyst, acquired   . Obesity   . Palpitations   . Polycythemia vera ([place])   . Thoracic aortic aneurysm ([place])   . Thyroid nodule     PAST SURGICAL HISTORY: Past Surgical History:  Procedure Laterality  Date  . DILITATION & CURRETTAGE/HYSTROSCOPY WITH THERMACHOICE ABLATION N/A 12/28/2012   Procedure: DILATATION & CURETTAGE/HYSTEROSCOPY WITH THERMACHOICE ABLATION;  Surgeon: Cyril Mourning, MD;  Location: [facility];  Service: Gynecology;  Laterality: N/A;  . MASTECTOMY     bilateral  . PARTIAL NEPHRECTOMY     RT   . TONSILLECTOMY    . TUBAL LIGATION      SOCIAL HISTORY: Social History   Tobacco Use  . Smoking status: Never Smoker  . Smokeless tobacco: Never Used  Substance Use Topics  . Alcohol use: Yes    Comment: Occasional  . Drug use: No    FAMILY HISTORY: Family History  Problem Relation Age of Onset  . Cancer Mother        lung  . Depression Mother   . Anxiety disorder Mother   . Bipolar disorder Mother   . Alcoholism Mother   . COPD Father   . Congestive Heart Failure Father   . Depression Father   . Alcoholism Father   . Stroke Maternal Grandfather     ROS: Review of Systems  Constitutional: Negative for weight loss.  Gastrointestinal: Negative for nausea and vomiting.  Musculoskeletal:       Negative muscle weakness    PHYSICAL EXAM: Blood pressure 112/79, pulse 75, temperature 97.9 F (36.6 C), height 5' 5"  (1.651 m),  weight 187 lb (84.8 kg), SpO2 97 %. Body mass index is 31.12 kg/m. Physical Exam  Constitutional: She is oriented to person, place, and time. She appears well-developed and well-nourished.  Cardiovascular: Normal rate.  Pulmonary/Chest: Effort normal.  Musculoskeletal: Normal range of motion.  Neurological: She is oriented to person, place, and time.  Skin: Skin is warm and dry.  Psychiatric: She has a normal mood and affect. Her behavior is normal.  Vitals reviewed.   RECENT LABS AND TESTS: BMET    Component Value Date/Time   NA 142 08/22/2017 0826   K 3.8 08/22/2017 0826   CL 102 08/22/2017 0826   CO2 24 08/22/2017 0826   GLUCOSE 95 08/22/2017 0826   GLUCOSE 89 08/09/2016 1108   BUN 12 08/22/2017 0826   CREATININE 0.93  08/22/2017 0826   CREATININE 0.79 08/09/2016 1108   CALCIUM 8.8 08/22/2017 0826   GFRNONAA 73 08/22/2017 0826   GFRNONAA >89 05/07/2013 1030   GFRAA 84 08/22/2017 0826   GFRAA >89 05/07/2013 1030   Lab Results  Component Value Date   HGBA1C 5.2 08/22/2017   HGBA1C 5.2 04/04/2017   HGBA1C 5.0 08/09/2016   HGBA1C 5.2 07/12/2014   Lab Results  Component Value Date   INSULIN 10.1 08/22/2017   INSULIN 9.1 04/04/2017   CBC    Component Value Date/Time   WBC 7.7 04/04/2017 0935   WBC 10.1 08/09/2016 1108   RBC 4.57 04/04/2017 0935   RBC 4.73 08/09/2016 1108   HGB 13.1 04/04/2017 0935   HCT 39.5 04/04/2017 0935   PLT 204 08/09/2016 1108   MCV 86 04/04/2017 0935   MCH 28.7 04/04/2017 0935   MCH 29.0 08/09/2016 1108   MCHC 33.2 04/04/2017 0935   MCHC 33.3 08/09/2016 1108   RDW 13.9 04/04/2017 0935   LYMPHSABS 1.7 04/04/2017 0935   EOSABS 0.4 04/04/2017 0935   BASOSABS 0.1 04/04/2017 0935   Iron/TIBC/Ferritin/ %Sat No results found for: IRON, TIBC, FERRITIN, IRONPCTSAT Lipid Panel     Component Value Date/Time   CHOL 217 (H) 08/22/2017 0826   TRIG 96 08/22/2017 0826   HDL 88 08/22/2017 0826   CHOLHDL 2.1 08/09/2016 1108   VLDL 13 07/12/2014 0945   LDLCALC 110 (H) 08/22/2017 0826   Hepatic Function Panel     Component Value Date/Time   PROT 6.9 08/22/2017 0826   ALBUMIN 4.4 08/22/2017 0826   AST 9 08/22/2017 0826   ALT 9 08/22/2017 0826   ALKPHOS 94 08/22/2017 0826   BILITOT <0.2 08/22/2017 0826      Component Value Date/Time   TSH 0.926 04/04/2017 0935   TSH 1.50 08/09/2016 1108   TSH 1.308 02/07/2015 1010  Results for SABRYN, PRESLAR (MRN [id number]) as of 10/12/2017 18:11  Ref. Range 08/22/2017 08:26  Vitamin D, 25-Hydroxy Latest Ref Range: 30.0 - 100.0 ng/mL 32.7    ASSESSMENT AND PLAN: Vitamin D deficiency  Class 1 obesity with serious comorbidity and body mass index (BMI) of 31.0 to 31.9 in adult, unspecified obesity type  PLAN:  Vitamin D  Deficiency Melissa Atkinson was informed that low vitamin D levels contributes to fatigue and are associated with obesity, breast, and colon cancer. Melissa Atkinson agrees to continue taking prescription Vit D @50 ,000 IU every week and will follow up for routine testing of vitamin D, at least 2-3 times per year. She was informed of the risk of over-replacement of vitamin D and agrees to not increase her dose unless she discusses this with [place] first. Melissa Atkinson  agrees to follow up with our clinic in 2 weeks.  We spent > than 50% of the 15 minute visit on the counseling as documented in the note.  Obesity Melissa Atkinson is currently in the action stage of change. As such, her goal is to continue with weight loss efforts She has agreed to follow the Category 2 plan Melissa Atkinson has been instructed to work up to a goal of 150 minutes of combined cardio and strengthening exercise per week for weight loss and overall health benefits. We discussed the following Behavioral Modification Strategies today: increasing lean protein intake and celebration eating strategies   Melissa Atkinson has agreed to follow up with our clinic in 2 weeks. She was informed of the importance of frequent follow up visits to maximize her success with intensive lifestyle modifications for her multiple health conditions.   OBESITY BEHAVIORAL INTERVENTION VISIT  Today's visit was # 12 out of 22.  Starting weight: 198 lbs Starting date: 04/04/17 Today's weight : 187 lbs Today's date: 10/12/2017 Total lbs lost to date: 11 (Patients must lose 7 lbs in the first 6 months to continue with counseling)   ASK: We discussed the diagnosis of obesity with Melissa Atkinson today and Melissa Atkinson agreed to give [place] permission to discuss obesity behavioral modification therapy today.  ASSESS: Ayris has the diagnosis of obesity and her BMI today is 31.12 Amandine is in the action stage of change   ADVISE: Equilla was educated on the multiple health risks of obesity as well as the benefit of weight loss to  improve her health. She was advised of the need for long term treatment and the importance of lifestyle modifications.  AGREE: Multiple dietary modification options and treatment options were discussed and  Vanetta agreed to the above obesity treatment plan.   Wilhemena Durie, am acting as transcriptionist for Lacy Duverney, PA-C I, Lacy Duverney [facility], have reviewed this note and agree with its content

## 2017-10-27 ENCOUNTER — Ambulatory Visit (INDEPENDENT_AMBULATORY_CARE_PROVIDER_SITE_OTHER): Payer: 59 | Admitting: Physician Assistant

## 2017-10-27 VITALS — BP 113/76 | HR 67 | Temp 98.0°F | Ht 65.0 in | Wt 188.0 lb

## 2017-10-27 DIAGNOSIS — E559 Vitamin D deficiency, unspecified: Secondary | ICD-10-CM

## 2017-10-27 DIAGNOSIS — E669 Obesity, unspecified: Secondary | ICD-10-CM

## 2017-10-27 DIAGNOSIS — Z9189 Other specified personal risk factors, not elsewhere classified: Secondary | ICD-10-CM | POA: Diagnosis not present

## 2017-10-27 DIAGNOSIS — Z6831 Body mass index (BMI) 31.0-31.9, adult: Secondary | ICD-10-CM | POA: Diagnosis not present

## 2017-10-27 DIAGNOSIS — F3289 Other specified depressive episodes: Secondary | ICD-10-CM

## 2017-10-27 MED ORDER — TOPIRAMATE 25 MG PO TABS: 25.0000 mg | ORAL_TABLET | Freq: Every day | ORAL | 0 refills | Status: DC

## 2017-10-27 MED ORDER — BUPROPION HCL ER (SR) 150 MG PO TB12: 150.0000 mg | ORAL_TABLET | Freq: Two times a day (BID) | ORAL | 0 refills | Status: DC

## 2017-10-27 MED FILL — TOPIRAMATE 25 MG TAB: 25 | 30 days supply | Qty: 30 | Fill #0

## 2017-10-27 MED FILL — BUPROPION SR 150 MG TABLET: 150 | 30 days supply | Qty: 60 | Fill #0

## 2017-10-31 NOTE — Progress Notes (Signed)
Office: [phone]  /  Fax: [phone]   HPI:   Chief Complaint: OBESITY Melissa Atkinson is here to discuss her progress with her obesity treatment plan. She is on the Category 2 plan and is following her eating plan approximately 50 % of the time. She states she is exercising 0 minutes 0 times per week. Melissa Atkinson was busy taking care of her sick father and hasn't been as mindful of her eating. She is motivated to get back on track and continue with weight loss. Her weight is 188 lb (85.3 kg) today and has had a weight gain of 1 pound over a period of 2 weeks since her last visit. She has lost 10 lbs since starting treatment with [place].  Vitamin D deficiency Melissa Atkinson has a diagnosis of vitamin D deficiency. She is currently taking vit D and denies nausea, vomiting or muscle weakness.  At risk for osteopenia and osteoporosis Melissa Atkinson is at higher risk of osteopenia and osteoporosis due to vitamin D deficiency.   Depression with emotional eating behaviors Melissa Atkinson is struggling with emotional eating and using food for comfort to the extent that it is negatively impacting her health. She often snacks when she is not hungry. Melissa Atkinson sometimes feels she is out of control and then feels guilty that she made poor food choices. She has been working on behavior modification techniques to help reduce her emotional eating and has been somewhat successful. Her mood is stable and she shows no sign of suicidal or homicidal ideations.  Depression screen PHQ 2/9 04/04/2017  Decreased Interest 2  Down, Depressed, Hopeless 3  PHQ - 2 Score 5  Altered sleeping 0  Tired, decreased energy 3  Change in appetite 1  Feeling bad or failure about yourself  3  Trouble concentrating 0  Moving slowly or fidgety/restless 0  Suicidal thoughts 0  PHQ-9 Score 12  Difficult doing work/chores Not difficult at all      ALLERGIES: No Known Allergies  MEDICATIONS: Current Outpatient Medications on File Prior to Visit  Medication Sig  Dispense Refill  . metoprolol succinate (TOPROL-XL) 25 MG 24 hr tablet TAKE 1 TABLET (25 MG TOTAL) BY MOUTH DAILY. 90 tablet 3  . triamcinolone cream (KENALOG) 0.1 % Apply 782 application topically 2 (two) times daily. 454 g 11  . Vitamin D, Ergocalciferol, (DRISDOL) 50000 units CAPS capsule Take 1 capsule (50,000 Units total) by mouth every 7 (seven) days. 4 capsule 0   No current facility-administered medications on file prior to visit.     PAST MEDICAL HISTORY: Past Medical History:  Diagnosis Date  . Anxiety   . Back pain   . Bicuspid aortic valve   . Breast cancer ([place])   . Depression   . Joint pain   . Kidney cyst, acquired   . Obesity   . Palpitations   . Polycythemia vera ([place])   . Thoracic aortic aneurysm ([place])   . Thyroid nodule     PAST SURGICAL HISTORY: Past Surgical History:  Procedure Laterality Date  . DILITATION & CURRETTAGE/HYSTROSCOPY WITH THERMACHOICE ABLATION N/A 12/28/2012   Procedure: DILATATION & CURETTAGE/HYSTEROSCOPY WITH THERMACHOICE ABLATION;  Surgeon: Cyril Mourning, MD;  Location: [facility];  Service: Gynecology;  Laterality: N/A;  . MASTECTOMY     bilateral  . PARTIAL NEPHRECTOMY     RT   . TONSILLECTOMY    . TUBAL LIGATION      SOCIAL HISTORY: Social History   Tobacco Use  . Smoking status: Never Smoker  .  Smokeless tobacco: Never Used  Substance Use Topics  . Alcohol use: Yes    Comment: Occasional  . Drug use: No    FAMILY HISTORY: Family History  Problem Relation Age of Onset  . Cancer Mother        lung  . Depression Mother   . Anxiety disorder Mother   . Bipolar disorder Mother   . Alcoholism Mother   . COPD Father   . Congestive Heart Failure Father   . Depression Father   . Alcoholism Father   . Stroke Maternal Grandfather     ROS: Review of Systems  Constitutional: Negative for weight loss.  Gastrointestinal: Negative for nausea and vomiting.  Musculoskeletal:       Negative for muscle weakness    Psychiatric/Behavioral: Positive for depression. Negative for suicidal ideas.    PHYSICAL EXAM: Blood pressure 113/76, pulse 67, temperature 98 F (36.7 C), temperature source Oral, height 5\' 5"  (1.651 m), weight 188 lb (85.3 kg), SpO2 98 %. Body mass index is 31.28 kg/m. Physical Exam  Constitutional: She is oriented to person, place, and time. She appears well-developed and well-nourished.  Cardiovascular: Normal rate.  Pulmonary/Chest: Effort normal.  Musculoskeletal: Normal range of motion.  Neurological: She is oriented to person, place, and time.  Skin: Skin is warm and dry.  Psychiatric: She has a normal mood and affect. Her behavior is normal.  Vitals reviewed.   RECENT LABS AND TESTS: BMET    Component Value Date/Time   NA 142 08/22/2017 0826   K 3.8 08/22/2017 0826   CL 102 08/22/2017 0826   CO2 24 08/22/2017 0826   GLUCOSE 95 08/22/2017 0826   GLUCOSE 89 08/09/2016 1108   BUN 12 08/22/2017 0826   CREATININE 0.93 08/22/2017 0826   CREATININE 0.79 08/09/2016 1108   CALCIUM 8.8 08/22/2017 0826   GFRNONAA 73 08/22/2017 0826   GFRNONAA >89 05/07/2013 1030   GFRAA 84 08/22/2017 0826   GFRAA >89 05/07/2013 1030   Lab Results  Component Value Date   HGBA1C 5.2 08/22/2017   HGBA1C 5.2 04/04/2017   HGBA1C 5.0 08/09/2016   HGBA1C 5.2 07/12/2014   Lab Results  Component Value Date   INSULIN 10.1 08/22/2017   INSULIN 9.1 04/04/2017   CBC    Component Value Date/Time   WBC 7.7 04/04/2017 0935   WBC 10.1 08/09/2016 1108   RBC 4.57 04/04/2017 0935   RBC 4.73 08/09/2016 1108   HGB 13.1 04/04/2017 0935   HCT 39.5 04/04/2017 0935   PLT 204 08/09/2016 1108   MCV 86 04/04/2017 0935   MCH 28.7 04/04/2017 0935   MCH 29.0 08/09/2016 1108   MCHC 33.2 04/04/2017 0935   MCHC 33.3 08/09/2016 1108   RDW 13.9 04/04/2017 0935   LYMPHSABS 1.7 04/04/2017 0935   EOSABS 0.4 04/04/2017 0935   BASOSABS 0.1 04/04/2017 0935   Iron/TIBC/Ferritin/ %Sat No results found for:  IRON, TIBC, FERRITIN, IRONPCTSAT Lipid Panel     Component Value Date/Time   CHOL 217 (H) 08/22/2017 0826   TRIG 96 08/22/2017 0826   HDL 88 08/22/2017 0826   CHOLHDL 2.1 08/09/2016 1108   VLDL 13 07/12/2014 0945   LDLCALC 110 (H) 08/22/2017 0826   Hepatic Function Panel     Component Value Date/Time   PROT 6.9 08/22/2017 0826   ALBUMIN 4.4 08/22/2017 0826   AST 9 08/22/2017 0826   ALT 9 08/22/2017 0826   ALKPHOS 94 08/22/2017 0826   BILITOT <0.2 08/22/2017 9937  Component Value Date/Time   TSH 0.926 04/04/2017 0935   TSH 1.50 08/09/2016 1108   TSH 1.308 02/07/2015 1010   Results for SHAMYA, MACFADDEN (MRN [id number]) as of 10/31/2017 10:23  Ref. Range 08/22/2017 08:26  Vitamin D, 25-Hydroxy Latest Ref Range: 30.0 - 100.0 ng/mL 32.7   ASSESSMENT AND PLAN: Vitamin D deficiency  Other depression - with emotional eating - Plan: topiramate (TOPAMAX) 25 MG tablet, buPROPion (WELLBUTRIN SR) 150 MG 12 hr tablet  At risk for osteoporosis  Class 1 obesity without serious comorbidity with body mass index (BMI) of 31.0 to 31.9 in adult, unspecified obesity type  PLAN:  Vitamin D Deficiency Melissa Atkinson was informed that low vitamin D levels contributes to fatigue and are associated with obesity, breast, and colon cancer. She agrees to continue to take prescription Vit D @50 ,000 IU every week #4 with no refills and will follow up for routine testing of vitamin D, at least 2-3 times per year. She was informed of the risk of over-replacement of vitamin D and agrees to not increase her dose unless she discusses this with [place] first.  At risk for osteopenia and osteoporosis Melissa Atkinson is at risk for osteopenia and osteoporosis due to her vitamin D deficiency. She was encouraged to take her vitamin D and follow her higher calcium diet and increase strengthening exercise to help strengthen her bones and decrease her risk of osteopenia and osteoporosis.  Depression with Emotional Eating Behaviors We  discussed behavior modification techniques today to help Melissa Atkinson deal with her emotional eating and depression. She has agreed to continue Wellbutrin SR 150 mg qd #30 with no refills and Topiramate 25 mg #30 with no refills. Melissa Atkinson agreed to follow up as directed.  Obesity Melissa Atkinson is currently in the action stage of change. As such, her goal is to continue with weight loss efforts She has agreed to follow the Category 2 plan Melissa Atkinson has been instructed to work up to a goal of 150 minutes of combined cardio and strengthening exercise per week for weight loss and overall health benefits. We discussed the following Behavioral Modification Strategies today: increasing lean protein intake and work on meal planning and easy cooking plans  Melissa Atkinson has agreed to follow up with our clinic in 2 weeks. She was informed of the importance of frequent follow up visits to maximize her success with intensive lifestyle modifications for her multiple health conditions.   OBESITY BEHAVIORAL INTERVENTION VISIT  Today's visit was # 13 out of 22.  Starting weight: 198 lbs Starting date: 04/04/17 Today's weight : 188 lbs Today's date: 10/27/2017 Total lbs lost to date: 10 (Patients must lose 7 lbs in the first 6 months to continue with counseling)   ASK: We discussed the diagnosis of obesity with Melissa Atkinson today and Melissa Atkinson agreed to give [place] permission to discuss obesity behavioral modification therapy today.  ASSESS: Melissa Atkinson has the diagnosis of obesity and her BMI today is 31.28 Melissa Atkinson is in the action stage of change   ADVISE: Melissa Atkinson was educated on the multiple health risks of obesity as well as the benefit of weight loss to improve her health. She was advised of the need for long term treatment and the importance of lifestyle modifications.  AGREE: Multiple dietary modification options and treatment options were discussed and  Melissa Atkinson agreed to the above obesity treatment plan.   Corey Skains, am acting as  transcriptionist for [employer], PA-C I, Lacy Duverney [facility], have reviewed this note and agree with  its content

## 2017-11-08 ENCOUNTER — Other Ambulatory Visit: Payer: Self-pay | Admitting: Cardiology

## 2017-11-08 DIAGNOSIS — I359 Nonrheumatic aortic valve disorder, unspecified: Secondary | ICD-10-CM

## 2017-11-08 MED FILL — METOPROLOL SUCCINATE ER 25: 25 | 30 days supply | Qty: 30 | Fill #0

## 2017-11-10 ENCOUNTER — Ambulatory Visit (INDEPENDENT_AMBULATORY_CARE_PROVIDER_SITE_OTHER): Payer: 59 | Admitting: Physician Assistant

## 2017-12-02 ENCOUNTER — Ambulatory Visit (INDEPENDENT_AMBULATORY_CARE_PROVIDER_SITE_OTHER): Payer: 59 | Admitting: Sports Medicine

## 2017-12-02 VITALS — BP 97/67 | HR 83 | Resp 16 | Wt 194.0 lb

## 2017-12-02 DIAGNOSIS — Z9013 Acquired absence of bilateral breasts and nipples: Secondary | ICD-10-CM

## 2017-12-02 DIAGNOSIS — F3289 Other specified depressive episodes: Secondary | ICD-10-CM | POA: Diagnosis not present

## 2017-12-02 DIAGNOSIS — Z0000 Encounter for general adult medical examination without abnormal findings: Secondary | ICD-10-CM | POA: Diagnosis not present

## 2017-12-02 DIAGNOSIS — R635 Abnormal weight gain: Secondary | ICD-10-CM

## 2017-12-02 MED ORDER — ACARBOSE 50 MG PO TABS
50.0000 mg | ORAL_TABLET | Freq: Three times a day (TID) | ORAL | 3 refills | Status: DC
Start: 2017-12-02 — End: 2018-05-10

## 2017-12-02 MED ORDER — TOPIRAMATE 50 MG PO TABS: 50.0000 mg | ORAL_TABLET | Freq: Two times a day (BID) | ORAL | 3 refills | Status: DC

## 2017-12-02 MED FILL — TOPIRAMATE 50 MG TABLET: 50 | 30 days supply | Qty: 60 | Fill #0

## 2017-12-02 MED FILL — ACARBOSE 50 MG TAB: 50 | 30 days supply | Qty: 90 | Fill #0

## 2017-12-02 NOTE — Assessment & Plan Note (Signed)
Benign exam of the TRAM flap reconstruction of the breast. She has noted some increase in size, I am going to order a diagnostic ultrasound of both breasts and axillae at the breast center. Exam was benign.   I think this is simply abdominal obesity, but of course now on her chest.

## 2017-12-02 NOTE — Progress Notes (Signed)
Subjective:    CC: Annual physical exam  HPI:  Annual physical exam: Up-to-date on screening measures.  Obesity: Would like some additional help losing weight, Saxenda, Belviq were not effective, unable to use phentermine due to bicuspid aortic valve.  Depression: Well-controlled with Wellbutrin 150 twice a day.  I reviewed the past medical history, family history, social history, surgical history, and allergies today and no changes were needed.  Please see the problem list section below in epic for further details.  Past Medical History: Past Medical History:  Diagnosis Date  . Anxiety   . Back pain   . Bicuspid aortic valve   . Breast cancer ([place])   . Depression   . Joint pain   . Kidney cyst, acquired   . Obesity   . Palpitations   . Polycythemia vera ([place])   . Thoracic aortic aneurysm ([place])   . Thyroid nodule    Past Surgical History: Past Surgical History:  Procedure Laterality Date  . DILITATION & CURRETTAGE/HYSTROSCOPY WITH THERMACHOICE ABLATION N/A 12/28/2012   Procedure: DILATATION & CURETTAGE/HYSTEROSCOPY WITH THERMACHOICE ABLATION;  Surgeon: Cyril Mourning, MD;  Location: [facility];  Service: Gynecology;  Laterality: N/A;  . MASTECTOMY     bilateral  . PARTIAL NEPHRECTOMY     RT   . TONSILLECTOMY    . TUBAL LIGATION     Social History: Social History   Socioeconomic History  . Marital status: Married    Spouse name: Timmothy Sours  . Number of children: 3  . Years of education: Not on file  . Highest education level: Not on file  Occupational History  . Occupation: Programmer, multimedia: [employer]  . Financial resource strain: Not on file  . Food insecurity:    Worry: Not on file    Inability: Not on file  . Transportation needs:    Medical: Not on file    Non-medical: Not on file  Tobacco Use  . Smoking status: Never Smoker  . Smokeless tobacco: Never Used  Substance and Sexual Activity  . Alcohol use: Yes    Comment: Occasional  . Drug  use: No  . Sexual activity: Not on file  Lifestyle  . Physical activity:    Days per week: Not on file    Minutes per session: Not on file  . Stress: Not on file  Relationships  . Social connections:    Talks on phone: Not on file    Gets together: Not on file    Attends religious service: Not on file    Active member of club or organization: Not on file    Attends meetings of clubs or organizations: Not on file    Relationship status: Not on file  Other Topics Concern  . Not on file  Social History Narrative  . Not on file   Family History: Family History  Problem Relation Age of Onset  . Cancer Mother        lung  . Depression Mother   . Anxiety disorder Mother   . Bipolar disorder Mother   . Alcoholism Mother   . COPD Father   . Congestive Heart Failure Father   . Depression Father   . Alcoholism Father   . Stroke Maternal Grandfather    Allergies: No Known Allergies Medications: See med rec.  Review of Systems: No headache, visual changes, nausea, vomiting, diarrhea, constipation, dizziness, abdominal pain, skin rash, fevers, chills, night sweats, swollen lymph nodes, weight loss,  chest pain, body aches, joint swelling, muscle aches, shortness of breath, mood changes, visual or auditory hallucinations.  Objective:    General: Well Developed, well nourished, and in no acute distress.  Neuro: Alert and oriented x3, extra-ocular muscles intact, sensation grossly intact. Cranial nerves II through XII are intact, motor, sensory, and coordinative functions are all intact. HEENT: Normocephalic, atraumatic, pupils equal round reactive to light, neck supple, no masses, no lymphadenopathy, thyroid nonpalpable. Oropharynx, nasopharynx, external ear canals are unremarkable. Skin: Warm and dry, no rashes noted.  Cardiac: Regular rate and rhythm, no murmurs rubs or gallops.  Respiratory: Clear to auscultation bilaterally. Not using accessory muscles, speaking in full sentences.    Breasts: Post TRAM flap reconstruction, everything looks good, nipple and areolar tattoos have faded, no palpable masses.  Exam with female chaperone. Abdominal: Soft, nontender, nondistended, positive bowel sounds, no masses, no organomegaly.  Musculoskeletal: Shoulder, elbow, wrist, hip, knee, ankle stable, and with full range of motion.  Impression and Recommendations:    The patient was counselled, risk factors were discussed, anticipatory guidance given.  Annual physical exam Annual physical exam as above. Labs were done about 3 months ago, lipids were only slightly high.  Hx of bilateral mastectomy post TRAM flap reconstruction Benign exam of the TRAM flap reconstruction of the breast. She has noted some increase in size, I am going to order a diagnostic ultrasound of both breasts and axillae at the breast center. Exam was benign.   I think this is simply abdominal obesity, but of course now on her chest.  Depression Well-controlled with Wellbutrin 150 twice a day, no changes.  Obesity Adding acarbose, increasing Topamax to twice a day.   Continue Wellbutrin 150 twice a day. Return in 1 month. Unable to use phentermine, history of heart valvular disease, bicuspid aortic valve, Belviq and Saxenda were not effective. ___________________________________________ [name]. Dianah Field, M.D., ABFM., CAQSM. Primary Care and [place] Instructor of [place] of [facility]

## 2017-12-02 NOTE — Assessment & Plan Note (Signed)
Adding acarbose, increasing Topamax to twice a day.   Continue Wellbutrin 150 twice a day. Return in 1 month. Unable to use phentermine, history of heart valvular disease, bicuspid aortic valve, Belviq and Saxenda were not effective.

## 2017-12-02 NOTE — Assessment & Plan Note (Signed)
Well-controlled with Wellbutrin 150 twice a day, no changes.

## 2017-12-02 NOTE — Assessment & Plan Note (Signed)
Annual physical exam as above. Labs were done about 3 months ago, lipids were only slightly high.

## 2017-12-22 MED FILL — METOPROLOL SUCCINATE ER 25: 25 | 30 days supply | Qty: 30 | Fill #1

## 2017-12-26 ENCOUNTER — Encounter: Payer: Self-pay | Admitting: Cardiology

## 2017-12-26 ENCOUNTER — Encounter: Payer: Self-pay | Admitting: Sports Medicine

## 2017-12-26 DIAGNOSIS — F3289 Other specified depressive episodes: Secondary | ICD-10-CM

## 2017-12-26 MED ORDER — BUPROPION HCL ER (SR) 150 MG PO TB12: 150.0000 mg | ORAL_TABLET | Freq: Two times a day (BID) | ORAL | 0 refills | Status: DC

## 2017-12-26 MED FILL — BUPROPION SR 150 MG TABLET: 150 | 30 days supply | Qty: 60 | Fill #0

## 2017-12-26 NOTE — Telephone Encounter (Signed)
All done. No problem.

## 2017-12-27 NOTE — Progress Notes (Signed)
HPI: FU bicuspid aortic valve. Previously followed at [facility] cardiology. Stress echocardiogram in March of 2013 showed normal LV function, probable bicuspid aortic valve and no stress-induced wall motion abnormalities. Monitor in March of 2013 showed sinus rhythm with PACs and short bursts of SVT but no sustained arrhythmias. MRA of cerebral vasculature February 2016 showed no aneurysm.  Echocardiogram March 2018 showed normal LV function, bicuspid aortic valve, aortic root at upper limits of normal.  MRA March 2018 showed ectasia of ascending thoracic aortic aneurysm measuring 3.7 cm.  Since she was last seen  she has dyspnea with more vigorous activities but not routine activities.  No orthopnea, PND, chest pain, palpitations or syncope.  Occasional mild pedal edema.  Current Outpatient Medications  Medication Sig Dispense Refill  . buPROPion (WELLBUTRIN SR) 150 MG 12 hr tablet Take 1 tablet (150 mg total) by mouth 2 (two) times daily. 60 tablet 0  . metoprolol succinate (TOPROL-XL) 25 MG 24 hr tablet TAKE 1 TABLET (25 MG TOTAL) BY MOUTH DAILY. 30 tablet 2  . topiramate (TOPAMAX) 50 MG tablet Take 1 tablet (50 mg total) by mouth 2 (two) times daily. 60 tablet 3  . triamcinolone cream (KENALOG) 0.1 % Apply 656 application topically 2 (two) times daily. 454 g 11  . Vitamin D, Ergocalciferol, (DRISDOL) 50000 units CAPS capsule Take 1 capsule (50,000 Units total) by mouth every 7 (seven) days. 4 capsule 0  . acarbose (PRECOSE) 50 MG tablet Take 1 tablet (50 mg total) by mouth 3 (three) times daily with meals. (Patient not taking: Reported on 01/04/2018) 90 tablet 3   No current facility-administered medications for this visit.      Past Medical History:  Diagnosis Date  . Anxiety   . Back pain   . Bicuspid aortic valve   . Breast cancer ([place])   . Depression   . Joint pain   . Kidney cyst, acquired   . Obesity   . Palpitations   . Polycythemia vera ([place])   . Thoracic aortic  aneurysm ([place])   . Thyroid nodule     Past Surgical History:  Procedure Laterality Date  . DILITATION & CURRETTAGE/HYSTROSCOPY WITH THERMACHOICE ABLATION N/A 12/28/2012   Procedure: DILATATION & CURETTAGE/HYSTEROSCOPY WITH THERMACHOICE ABLATION;  Surgeon: Cyril Mourning, MD;  Location: [facility];  Service: Gynecology;  Laterality: N/A;  . MASTECTOMY     bilateral  . PARTIAL NEPHRECTOMY     RT   . TONSILLECTOMY    . TUBAL LIGATION      Social History   Socioeconomic History  . Marital status: Married    Spouse name: Timmothy Sours  . Number of children: 3  . Years of education: Not on file  . Highest education level: Not on file  Occupational History  . Occupation: Programmer, multimedia: [employer]  . Financial resource strain: Not on file  . Food insecurity:    Worry: Not on file    Inability: Not on file  . Transportation needs:    Medical: Not on file    Non-medical: Not on file  Tobacco Use  . Smoking status: Never Smoker  . Smokeless tobacco: Never Used  Substance and Sexual Activity  . Alcohol use: Yes    Comment: Occasional  . Drug use: No  . Sexual activity: Not on file  Lifestyle  . Physical activity:    Days per week: Not on file    Minutes per session: Not  on file  . Stress: Not on file  Relationships  . Social connections:    Talks on phone: Not on file    Gets together: Not on file    Attends religious service: Not on file    Active member of club or organization: Not on file    Attends meetings of clubs or organizations: Not on file    Relationship status: Not on file  . Intimate partner violence:    Fear of current or ex partner: Not on file    Emotionally abused: Not on file    Physically abused: Not on file    Forced sexual activity: Not on file  Other Topics Concern  . Not on file  Social History Narrative  . Not on file    Family History  Problem Relation Age of Onset  . Cancer Mother        lung  . Depression Mother   . Anxiety  disorder Mother   . Bipolar disorder Mother   . Alcoholism Mother   . COPD Father   . Congestive Heart Failure Father   . Depression Father   . Alcoholism Father   . Stroke Maternal Grandfather     ROS: no fevers or chills, productive cough, hemoptysis, dysphasia, odynophagia, melena, hematochezia, dysuria, hematuria, rash, seizure activity, orthopnea, PND, pedal edema, claudication. Remaining systems are negative.  Physical Exam: Well-developed well-nourished in no acute distress.  Skin is warm and dry.  HEENT is normal.  Neck is supple.  Chest is clear to auscultation with normal expansion.  Cardiovascular exam is regular rate and rhythm.  Abdominal exam nontender or distended. No masses palpated. Extremities show no edema. neuro grossly intact  A/P  1 bicuspid aortic valve-patient doing well with no complaints of worsening dyspnea or chest pain.  She will need follow-up echoes in the future but I do not think we need to pursue at this point.  2 history of thoracic aortic ectasia-we will repeat MRA when she returns in 1 year.  3 palpitations-symptoms are reasonably well controlled.  Continue beta-blocker.  4 hypertension-blood pressure is controlled.  Continue present medications.  Kirk Ruths, MD

## 2018-01-04 ENCOUNTER — Ambulatory Visit: Payer: 59 | Admitting: Sports Medicine

## 2018-01-04 ENCOUNTER — Encounter: Payer: Self-pay | Admitting: Cardiology

## 2018-01-04 ENCOUNTER — Ambulatory Visit: Payer: 59 | Admitting: Cardiology

## 2018-01-04 VITALS — BP 128/84 | HR 69 | Ht 65.0 in | Wt 195.0 lb

## 2018-01-04 DIAGNOSIS — Q231 Congenital insufficiency of aortic valve: Secondary | ICD-10-CM | POA: Diagnosis not present

## 2018-01-04 DIAGNOSIS — I712 Thoracic aortic aneurysm, without rupture, unspecified: Secondary | ICD-10-CM

## 2018-01-04 DIAGNOSIS — I10 Essential (primary) hypertension: Secondary | ICD-10-CM | POA: Diagnosis not present

## 2018-01-04 NOTE — Patient Instructions (Signed)
Your physician wants you to follow-up in: ONE YEAR WITH DR CRENSHAW You will receive a reminder letter in the mail two months in advance. If you don't receive a letter, please call our office to schedule the follow-up appointment.   If you need a refill on your cardiac medications before your next appointment, please call your pharmacy.  

## 2018-01-05 ENCOUNTER — Ambulatory Visit: Admission: RE | Admit: 2018-01-05 | Payer: 59 | Source: Ambulatory Visit

## 2018-01-05 ENCOUNTER — Other Ambulatory Visit: Payer: Self-pay | Admitting: Sports Medicine

## 2018-01-05 ENCOUNTER — Ambulatory Visit
Admission: RE | Admit: 2018-01-05 | Discharge: 2018-01-05 | Disposition: A | Discharge status: Home / Self Care | Payer: 59 | Source: Ambulatory Visit | Attending: Sports Medicine | Admitting: Sports Medicine

## 2018-01-05 DIAGNOSIS — Z9013 Acquired absence of bilateral breasts and nipples: Secondary | ICD-10-CM

## 2018-01-05 DIAGNOSIS — N6489 Other specified disorders of breast: Secondary | ICD-10-CM | POA: Diagnosis not present

## 2018-01-31 MED FILL — TOPIRAMATE 50 MG TABLET: 50 | 30 days supply | Qty: 60 | Fill #1

## 2018-01-31 MED FILL — METOPROLOL SUCCINATE ER 25: 25 | 30 days supply | Qty: 30 | Fill #2

## 2018-02-22 ENCOUNTER — Other Ambulatory Visit: Payer: Self-pay | Admitting: Sports Medicine

## 2018-02-22 DIAGNOSIS — F3289 Other specified depressive episodes: Secondary | ICD-10-CM

## 2018-02-22 MED FILL — BUPROPION SR 150 MG TABLET: 150 | 30 days supply | Qty: 60 | Fill #0

## 2018-02-28 ENCOUNTER — Other Ambulatory Visit: Payer: Self-pay | Admitting: Sports Medicine

## 2018-03-01 MED FILL — TRIAMCINOLONE 0.1% CREAM: 0.1 | 30 days supply | Qty: 454 | Fill #0

## 2018-04-25 ENCOUNTER — Other Ambulatory Visit: Payer: Self-pay | Admitting: Sports Medicine

## 2018-04-25 DIAGNOSIS — F3289 Other specified depressive episodes: Secondary | ICD-10-CM

## 2018-04-25 MED FILL — BUPROPION SR 150 MG TABLET: 150 | 30 days supply | Qty: 60 | Fill #0

## 2018-05-10 ENCOUNTER — Encounter: Payer: Self-pay | Admitting: Family Medicine

## 2018-05-10 ENCOUNTER — Ambulatory Visit (INDEPENDENT_AMBULATORY_CARE_PROVIDER_SITE_OTHER): Payer: 59 | Admitting: Family Medicine

## 2018-05-10 ENCOUNTER — Telehealth: Payer: Self-pay | Admitting: Cardiology

## 2018-05-10 VITALS — BP 134/82 | HR 82 | Ht 65.0 in | Wt 202.0 lb

## 2018-05-10 DIAGNOSIS — I160 Hypertensive urgency: Secondary | ICD-10-CM | POA: Diagnosis not present

## 2018-05-10 DIAGNOSIS — I712 Thoracic aortic aneurysm, without rupture, unspecified: Secondary | ICD-10-CM

## 2018-05-10 DIAGNOSIS — D45 Polycythemia vera: Secondary | ICD-10-CM | POA: Diagnosis not present

## 2018-05-10 DIAGNOSIS — I10 Essential (primary) hypertension: Secondary | ICD-10-CM

## 2018-05-10 DIAGNOSIS — Q231 Congenital insufficiency of aortic valve: Secondary | ICD-10-CM

## 2018-05-10 DIAGNOSIS — R42 Dizziness and giddiness: Secondary | ICD-10-CM

## 2018-05-10 DIAGNOSIS — E041 Nontoxic single thyroid nodule: Secondary | ICD-10-CM

## 2018-05-10 NOTE — Progress Notes (Signed)
Melissa Atkinson is a 47 y.o. female who presents to [facility]: [place] today for blood pressure dizziness.    Patient has had episodes of elevated blood pressure over the last few days.  She had blood pressures in the 032Z and 224 systolic.  She has been discussing this with her cardiologist who advised her to start taking metoprolol succinate 25 mg.  She is tried this and notes that it has helped her blood pressure a bit.  She has a follow-up appointment scheduled with her cardiologist later this week.  She notes some lightheadedness and ear pressure.  She is tried some over-the-counter medicines not including Sudafed which helped a bit.  She feels well otherwise no vomiting diarrhea weakness or numbness   ROS as above:  Exam:  BP 134/82   Pulse 82   Ht 5\' 5"  (1.651 m)   Wt 202 lb (91.6 kg)   BMI 33.61 kg/m  Wt Readings from Last 5 Encounters:  05/10/18 202 lb (91.6 kg)  01/04/18 195 lb (88.5 kg)  12/02/17 194 lb (88 kg)  10/27/17 188 lb (85.3 kg)  10/12/17 187 lb (84.8 kg)    Gen: Well NAD HEENT: EOMI,  MMM tympanic membranes bilaterally are retracted.  No erythema.  Normal posterior pharynx.  Normal retinal exam bilaterally. Lungs: Normal work of breathing. CTABL Heart: RRR no soft systolic murmur radiating to neck present Abd: NABS, Soft. Nondistended, Nontender no renal artery bruits present Exts: Brisk capillary refill, warm and well perfused.  Neuro alert and oriented normal coordination balance gait sensation.  Lab and Radiology Results  Twelve-lead EKG shows normal sinus rhythm at 87 bpm no ST segment elevation or depression normal EKG.  No significant change from prior EKG.    Assessment and Plan: 47 y.o. female with  Hypertension and lightheadedness and dizziness.  Patient had episodes of hypertension into the 170s.  Her blood pressure has improved  significantly with restarting metoprolol.  Plan to continue metoprolol and check basic labs listed below.  Patient has a follow-up appointment scheduled with cardiology later this week.  Discussed warning signs or symptoms and plan on recheck sooner if needed.  Did obtain troponin if abnormal likely recommend go to emergency room for further evaluation and management emergently.  I think troponin very unlikely to be abnormal.  Auscultated murmur very likely due to bicuspid aortic valve.  This was seen previously on echocardiogram.  We will also assess thyroid for potential hypothyroidism and recheck CBC with differential to follow-up polycythemia vera.   Orders Placed This Encounter  Procedures  . COMPLETE METABOLIC PANEL WITH GFR  . TSH  . T4, free  . T3, free  . VITAMIN D 25 Hydroxy (Vit-D Deficiency, Fractures)  . Magnesium  . CBC with Differential/Platelet  . Troponin T  . B Nat Peptide   No orders of the defined types were placed in this encounter.    Historical information moved to improve visibility of documentation.  Past Medical History:  Diagnosis Date  . Anxiety   . Back pain   . Bicuspid aortic valve   . Breast cancer ([place])   . Depression   . Joint pain   . Kidney cyst, acquired   . Obesity   . Palpitations   . Polycythemia vera ([place])   . Thoracic aortic aneurysm ([place])   . Thyroid nodule    Past Surgical History:  Procedure Laterality Date  . DILITATION &  CURRETTAGE/HYSTROSCOPY WITH THERMACHOICE ABLATION N/A 12/28/2012   Procedure: DILATATION & CURETTAGE/HYSTEROSCOPY WITH THERMACHOICE ABLATION;  Surgeon: Cyril Mourning, MD;  Location: [facility];  Service: Gynecology;  Laterality: N/A;  . MASTECTOMY     bilateral  . PARTIAL NEPHRECTOMY     RT   . TONSILLECTOMY    . TUBAL LIGATION     Social History   Tobacco Use  . Smoking status: Never Smoker  . Smokeless tobacco: Never Used  Substance Use Topics  . Alcohol use: Yes    Comment: Occasional   family  history includes Alcoholism in her father and mother; Anxiety disorder in her mother; Bipolar disorder in her mother; COPD in her father; Cancer in her mother; Congestive Heart Failure in her father; Depression in her father and mother; Stroke in her maternal grandfather.  Medications: Current Outpatient Medications  Medication Sig Dispense Refill  . buPROPion (WELLBUTRIN SR) 150 MG 12 hr tablet TAKE 1 TABLET BY MOUTH 2 TIMES DAILY. 60 tablet 0  . metoprolol succinate (TOPROL-XL) 25 MG 24 hr tablet TAKE 1 TABLET (25 MG TOTAL) BY MOUTH DAILY. 30 tablet 2  . triamcinolone cream (KENALOG) 0.1 % APPLY TOPICALLY 2 TIMES DAILY. 454 g 11  . Vitamin D, Ergocalciferol, (DRISDOL) 50000 units CAPS capsule Take 1 capsule (50,000 Units total) by mouth every 7 (seven) days. 4 capsule 0   No current facility-administered medications for this visit.    No Known Allergies   Discussed warning signs or symptoms. Please see discharge instructions. Patient expresses understanding.

## 2018-05-10 NOTE — Telephone Encounter (Signed)
Returned call to patient she stated she felt dizzy,light headed at work yesterday.B/P 170/107,169/100.Stated she went home and took Metoprolol Succ 25 mg.Stated she stopped Metoprolol 6 months ago and was only taking if needed for palpitations.Stated this morning when she woke up she still felt light headed.B/P 154/100 pulse 80 to 90.She took another Metoprolol Succ 25 mg.Spoke to [employer] he received your email from yesterday and advised ok to take Metoprolol Succ 25 mg daily.Advised to schedule follow up appointment.Appointment scheduled 05/12/18 at 10:20 am.Advised to bring all medications and B/P readings.

## 2018-05-10 NOTE — Telephone Encounter (Signed)
Pt c/o BP issue: STAT if pt c/o blurred vision, one-sided weakness or slurred speech  1. What are your last 5 BP readings?   174/100 164/104 154/100 this morning   2. Are you having any other symptoms (ex. Dizziness, headache, blurred vision, passed out)? Dizziness, lightheaded, has floaters with her vision.   3. What is your BP issue? .  Patient states she does not have history of hypertension

## 2018-05-10 NOTE — Addendum Note (Signed)
Addended by: Narda Rutherford on: 05/10/2018 12:12 PM   Modules accepted: Orders

## 2018-05-10 NOTE — Patient Instructions (Signed)
Thank you for coming in today. Get labs now.  Follow up with Dr Stanford Breed as scheduled.  Continue metoprolol.  Recheck sooner if needed.   Call or go to the emergency room if you get worse, have trouble breathing, have chest pains, or palpitations.   Dizziness Dizziness is a common problem. It is a feeling of unsteadiness or light-headedness. You may feel like you are about to faint. Dizziness can lead to injury if you stumble or fall. Anyone can become dizzy, but dizziness is more common in older adults. This condition can be caused by a number of things, including medicines, dehydration, or illness. Follow these instructions at home: Eating and drinking  Drink enough fluid to keep your urine clear or pale yellow. This helps to keep you from becoming dehydrated. Try to drink more clear fluids, such as water.  Do not drink alcohol.  Limit your caffeine intake if told to do so by your health care provider. Check ingredients and nutrition facts to see if a food or beverage contains caffeine.  Limit your salt (sodium) intake if told to do so by your health care provider. Check ingredients and nutrition facts to see if a food or beverage contains sodium. Activity  Avoid making quick movements. ? Rise slowly from chairs and steady yourself until you feel okay. ? In the morning, first sit up on the side of the bed. When you feel okay, stand slowly while you hold onto something until you know that your balance is fine.  If you need to stand in one place for a long time, move your legs often. Tighten and relax the muscles in your legs while you are standing.  Do not drive or use heavy machinery if you feel dizzy.  Avoid bending down if you feel dizzy. Place items in your home so that they are easy for you to reach without leaning over. Lifestyle  Do not use any products that contain nicotine or tobacco, such as cigarettes and e-cigarettes. If you need help quitting, ask your health care  provider.  Try to reduce your stress level by using methods such as yoga or meditation. Talk with your health care provider if you need help to manage your stress. General instructions  Watch your dizziness for any changes.  Take over-the-counter and prescription medicines only as told by your health care provider. Talk with your health care provider if you think that your dizziness is caused by a medicine that you are taking.  Tell a friend or a family member that you are feeling dizzy. If he or she notices any changes in your behavior, have this person call your health care provider.  Keep all follow-up visits as told by your health care provider. This is important. Contact a health care provider if:  Your dizziness does not go away.  Your dizziness or light-headedness gets worse.  You feel nauseous.  You have reduced hearing.  You have new symptoms.  You are unsteady on your feet or you feel like the room is spinning. Get help right away if:  You vomit or have diarrhea and are unable to eat or drink anything.  You have problems talking, walking, swallowing, or using your arms, hands, or legs.  You feel generally weak.  You are not thinking clearly or you have trouble forming sentences. It may take a friend or family member to notice this.  You have chest pain, abdominal pain, shortness of breath, or sweating.  Your vision changes.  You  have any bleeding.  You have a severe headache.  You have neck pain or a stiff neck.  You have a fever. These symptoms may represent a serious problem that is an emergency. Do not wait to see if the symptoms will go away. Get medical help right away. Call your local emergency services (911 in the [place] Do not drive yourself to the hospital. Summary  Dizziness is a feeling of unsteadiness or light-headedness. This condition can be caused by a number of things, including medicines, dehydration, or illness.  Anyone can become dizzy,  but dizziness is more common in older adults.  Drink enough fluid to keep your urine clear or pale yellow. Do not drink alcohol.  Avoid making quick movements if you feel dizzy. Monitor your dizziness for any changes. This information is not intended to replace advice given to you by your health care provider. Make sure you discuss any questions you have with your health care provider. Document Released: 12/08/2000 Document Revised: 07/17/2016 Document Reviewed: 07/17/2016 Elsevier Interactive Patient Education  [employer].

## 2018-05-11 LAB — CBC WITH DIFFERENTIAL/PLATELET
Basophils Absolute: 88 cells/uL (ref 0–200)
Basophils Relative: 1 %
Eosinophils Absolute: 510 cells/uL — ABNORMAL HIGH (ref 15–500)
Eosinophils Relative: 5.8 %
HCT: 40.7 % (ref 35.0–45.0)
Hemoglobin: 13.6 g/dL (ref 11.7–15.5)
Lymphs Abs: 1830 cells/uL (ref 850–3900)
MCH: 28.4 pg (ref 27.0–33.0)
MCHC: 33.4 g/dL (ref 32.0–36.0)
MCV: 85 fL (ref 80.0–100.0)
MPV: 10.2 fL (ref 7.5–12.5)
Monocytes Relative: 8.4 %
Neutro Abs: 5632 cells/uL (ref 1500–7800)
Neutrophils Relative %: 64 %
Platelets: 189 10*3/uL (ref 140–400)
RBC: 4.79 10*6/uL (ref 3.80–5.10)
RDW: 12.6 % (ref 11.0–15.0)
Total Lymphocyte: 20.8 %
WBC mixed population: 739 cells/uL (ref 200–950)
WBC: 8.8 10*3/uL (ref 3.8–10.8)

## 2018-05-11 LAB — T4, FREE: Free T4: 1.2 ng/dL (ref 0.8–1.8)

## 2018-05-11 LAB — COMPLETE METABOLIC PANEL WITH GFR
AG Ratio: 1.5 (calc) (ref 1.0–2.5)
ALT: 16 U/L (ref 6–29)
AST: 17 U/L (ref 10–35)
Albumin: 4.1 g/dL (ref 3.6–5.1)
Alkaline phosphatase (APISO): 80 U/L (ref 33–115)
BUN: 14 mg/dL (ref 7–25)
CO2: 32 mmol/L (ref 20–32)
Calcium: 9.3 mg/dL (ref 8.6–10.2)
Chloride: 104 mmol/L (ref 98–110)
Creat: 0.84 mg/dL (ref 0.50–1.10)
GFR, Est African American: 95 mL/min/{1.73_m2} (ref >=60)
GFR, Est Non African American: 82 mL/min/{1.73_m2} (ref >=60)
Globulin: 2.7 g/dL (calc) (ref 1.9–3.7)
Glucose, Bld: 101 mg/dL — ABNORMAL HIGH (ref 65–99)
Potassium: 3.7 mmol/L (ref 3.5–5.3)
Sodium: 141 mmol/L (ref 135–146)
Total Bilirubin: 0.4 mg/dL (ref 0.2–1.2)
Total Protein: 6.8 g/dL (ref 6.1–8.1)

## 2018-05-11 LAB — TSH: TSH: 1.6 mIU/L

## 2018-05-11 LAB — MAGNESIUM: Magnesium: 1.8 mg/dL (ref 1.5–2.5)

## 2018-05-11 LAB — VITAMIN D 25 HYDROXY (VIT D DEFICIENCY, FRACTURES): Vit D, 25-Hydroxy: 19 ng/mL — ABNORMAL LOW (ref 30–100)

## 2018-05-11 LAB — T3, FREE: T3, Free: 2.8 pg/mL (ref 2.3–4.2)

## 2018-05-12 ENCOUNTER — Ambulatory Visit: Payer: 59 | Admitting: Cardiology

## 2018-05-12 ENCOUNTER — Encounter: Payer: Self-pay | Admitting: Cardiology

## 2018-05-12 VITALS — BP 143/96 | HR 80 | Ht 65.0 in | Wt 200.0 lb

## 2018-05-12 DIAGNOSIS — I10 Essential (primary) hypertension: Secondary | ICD-10-CM | POA: Diagnosis not present

## 2018-05-12 DIAGNOSIS — I712 Thoracic aortic aneurysm, without rupture, unspecified: Secondary | ICD-10-CM

## 2018-05-12 DIAGNOSIS — Q231 Congenital insufficiency of aortic valve: Secondary | ICD-10-CM

## 2018-05-12 MED ORDER — LOSARTAN POTASSIUM 50 MG PO TABS: 50.0000 mg | ORAL_TABLET | Freq: Every day | ORAL | 3 refills | Status: DC

## 2018-05-12 MED FILL — LOSARTAN POTASSIUM 50 MG TA: 50 | 90 days supply | Qty: 90 | Fill #0

## 2018-05-12 NOTE — Progress Notes (Signed)
HPI: FU bicuspid aortic valve. Previously followed at [facility] cardiology. Stress echocardiogram in March of 2013 showed normal LV function, probable bicuspid aortic valve and no stress-induced wall motion abnormalities. Monitor in March of 2013 showed sinus rhythm with PACs and short bursts of SVT but no sustained arrhythmias. MRA of cerebral vasculature February 2016 showed no aneurysm.  Echocardiogram March 2018 showed normal LV function, bicuspid aortic valve, aortic root at upper limits of normal.  MRA March 2018 showed ectasia of ascending thoracic aortic aneurysm measuring 3.7 cm.    Recently contacted the office with elevated blood pressure.  Since she was last seenshe has noticed increased blood pressure.  This is associated with headaches.  She has difficulty focusing by report.  No other neurological symptoms.  She occasionally has indigestion but no exertional chest pain.  Mild dyspnea.  Current Outpatient Medications  Medication Sig Dispense Refill  . buPROPion (WELLBUTRIN SR) 150 MG 12 hr tablet TAKE 1 TABLET BY MOUTH 2 TIMES DAILY. 60 tablet 0  . metoprolol succinate (TOPROL-XL) 25 MG 24 hr tablet TAKE 1 TABLET (25 MG TOTAL) BY MOUTH DAILY. 30 tablet 2  . triamcinolone cream (KENALOG) 0.1 % APPLY TOPICALLY 2 TIMES DAILY. 454 g 11   No current facility-administered medications for this visit.      Past Medical History:  Diagnosis Date  . Anxiety   . Back pain   . Bicuspid aortic valve   . Breast cancer ([place])   . Depression   . Joint pain   . Kidney cyst, acquired   . Obesity   . Palpitations   . Polycythemia vera ([place])   . Thoracic aortic aneurysm ([place])   . Thyroid nodule     Past Surgical History:  Procedure Laterality Date  . DILITATION & CURRETTAGE/HYSTROSCOPY WITH THERMACHOICE ABLATION N/A 12/28/2012   Procedure: DILATATION & CURETTAGE/HYSTEROSCOPY WITH THERMACHOICE ABLATION;  Surgeon: Cyril Mourning, MD;  Location: [facility];  Service: Gynecology;   Laterality: N/A;  . MASTECTOMY     bilateral  . PARTIAL NEPHRECTOMY     RT   . TONSILLECTOMY    . TUBAL LIGATION      Social History   Socioeconomic History  . Marital status: Married    Spouse name: Timmothy Sours  . Number of children: 3  . Years of education: Not on file  . Highest education level: Not on file  Occupational History  . Occupation: Programmer, multimedia: [employer]  . Financial resource strain: Not on file  . Food insecurity:    Worry: Not on file    Inability: Not on file  . Transportation needs:    Medical: Not on file    Non-medical: Not on file  Tobacco Use  . Smoking status: Never Smoker  . Smokeless tobacco: Never Used  Substance and Sexual Activity  . Alcohol use: Yes    Comment: Occasional  . Drug use: No  . Sexual activity: Not on file  Lifestyle  . Physical activity:    Days per week: Not on file    Minutes per session: Not on file  . Stress: Not on file  Relationships  . Social connections:    Talks on phone: Not on file    Gets together: Not on file    Attends religious service: Not on file    Active member of club or organization: Not on file    Attends meetings of clubs or organizations: Not on  file    Relationship status: Not on file  . Intimate partner violence:    Fear of current or ex partner: Not on file    Emotionally abused: Not on file    Physically abused: Not on file    Forced sexual activity: Not on file  Other Topics Concern  . Not on file  Social History Narrative  . Not on file    Family History  Problem Relation Age of Onset  . Cancer Mother        lung  . Depression Mother   . Anxiety disorder Mother   . Bipolar disorder Mother   . Alcoholism Mother   . COPD Father   . Congestive Heart Failure Father   . Depression Father   . Alcoholism Father   . Stroke Maternal Grandfather     ROS: no fevers or chills, productive cough, hemoptysis, dysphasia, odynophagia, melena, hematochezia, dysuria, hematuria,  rash, seizure activity, orthopnea, PND, pedal edema, claudication. Remaining systems are negative.  Physical Exam: Well-developed well-nourished in no acute distress.  Skin is warm and dry.  HEENT is normal.  Neck is supple.  Chest is clear to auscultation with normal expansion.  Cardiovascular exam is regular rate and rhythm.  Abdominal exam nontender or distended. No masses palpated. Extremities show no edema. neuro grossly intact  A/P  1 hypertension-patient has had recent elevation in blood pressure.  I will add losartan 50 mg daily.  Check potassium and renal function in 1 week.  Follow blood pressure and adjust regimen as needed.  Continue Toprol.  2 history of thoracic aortic ectasia-given bicuspid aortic valve I will plan to repeat MRA July 2020.  3 bicuspid aortic valve-she will need follow-up echoes in the future.  However no aortic stenosis or aortic insufficiency on last echocardiogram.  4 palpitations-symptoms are well controlled.  Continue beta-blocker.  Kirk Ruths, MD

## 2018-05-12 NOTE — Patient Instructions (Addendum)
Medication Instructions:  Start taking Losartan 50 mg daily If you need a refill on your cardiac medications before your next appointment, please call your pharmacy.   Lab work: Art gallery manager in 1 week  Friday 05/19/18 If you have labs (blood work) drawn today and your tests are completely normal, you will receive your results only by: Marland Kitchen MyChart Message (if you have MyChart) OR . A paper copy in the mail If you have any lab test that is abnormal or we need to change your treatment, we will call you to review the results.  Testing/Procedures: None ordered  Follow-Up: At [facility], you and your health needs are our priority.  As part of our continuing mission to provide you with exceptional heart care, we have created designated Provider Care Teams.  These Care Teams include your primary Cardiologist (physician) and Advanced Practice Providers (APPs -  Physician Assistants and Nurse Practitioners) who all work together to provide you with the care you need, when you need it.   3 month follow up  Wed 08/02/18 at 8:00 am

## 2018-05-13 LAB — BRAIN NATRIURETIC PEPTIDE: Brain Natriuretic Peptide: 17 pg/mL (ref <100)

## 2018-05-13 LAB — TROPONIN T: Troponin T TROPT: <0.01 ng/mL (ref <0.01)

## 2018-05-23 DIAGNOSIS — I10 Essential (primary) hypertension: Secondary | ICD-10-CM | POA: Diagnosis not present

## 2018-05-23 DIAGNOSIS — Q231 Congenital insufficiency of aortic valve: Secondary | ICD-10-CM | POA: Diagnosis not present

## 2018-05-23 DIAGNOSIS — I712 Thoracic aortic aneurysm, without rupture: Secondary | ICD-10-CM | POA: Diagnosis not present

## 2018-05-24 LAB — BASIC METABOLIC PANEL WITH GFR
BUN/Creatinine Ratio: 16 (ref 9–23)
BUN: 13 mg/dL (ref 6–24)
CO2: 22 mmol/L (ref 20–29)
Calcium: 9.2 mg/dL (ref 8.7–10.2)
Chloride: 101 mmol/L (ref 96–106)
Creatinine, Ser: 0.79 mg/dL (ref 0.57–1.00)
GFR calc Af Amer: 102 mL/min/1.73 (ref >59)
GFR calc non Af Amer: 88 mL/min/1.73 (ref >59)
Glucose: 84 mg/dL (ref 65–99)
Potassium: 4.4 mmol/L (ref 3.5–5.2)
Sodium: 138 mmol/L (ref 134–144)

## 2018-06-06 ENCOUNTER — Other Ambulatory Visit: Payer: Self-pay | Admitting: Cardiology

## 2018-06-06 DIAGNOSIS — I359 Nonrheumatic aortic valve disorder, unspecified: Secondary | ICD-10-CM

## 2018-06-06 MED FILL — METOPROLOL SUCCINATE ER 25: 25 | 90 days supply | Qty: 90 | Fill #0

## 2018-07-06 ENCOUNTER — Telehealth: Payer: Self-pay | Admitting: Cardiology

## 2018-07-06 NOTE — Telephone Encounter (Signed)
New Message    STAT if HR is under 50 or over 120 (normal HR is 60-100 beats per minute)  1) What is your heart rate? High 130s-140s around 12am   BP 124/84   2) Do you have a log of your heart rate readings (document readings)? No   3) Do you have any other symptoms? Some shortness of breath, nauseous, sweating. Woke up and couldn't go back to sleep  Pt states she is feeling better this morning but did not feel well in the night

## 2018-07-06 NOTE — Telephone Encounter (Signed)
Spoke with pt. Pt aware of Dr.Crenshaw's recommendation. appt sch for 07/07/18 @ 1:30pm with Almyra Deforest, PA. Pt aware of appt date and time. Pt voiced appreciation for the assistance.

## 2018-07-06 NOTE — Telephone Encounter (Signed)
[facility]

## 2018-07-06 NOTE — Telephone Encounter (Signed)
Returned pt call. pts that last night around 12am she had an episode were her heartrate was in the 130-140's it lasted for several minutes. Her BP was normal 124/84. Pt sts that she felt horrible. She was sob, sweaty, and nauseated. She was about to have her husband call 911 but her symptoms resolved and she felt better. Pt sts that she has never felt like that before. She was unable to go back to sleep.  Pt sts that she is taking her medications as prescribed. On Tues morning she had an episode of dizziness while driving, she was taking all ger medications in the morning, she thinks her BP may have dropped to low. She decide to try taking them at bedtime to she if there were better tolerated, last night was the first night that she has taken them at bedtime.  Pt is currently asymptomatic and not had a reoccurrence this morning. Adv pt that I will fwd an update to [facility] and call back with his recommendation. Pt agreeable with plan and verbalizes understanding.

## 2018-07-07 ENCOUNTER — Encounter: Payer: Self-pay | Admitting: Physician Assistant

## 2018-07-07 ENCOUNTER — Other Ambulatory Visit: Payer: Self-pay | Admitting: Sports Medicine

## 2018-07-07 ENCOUNTER — Ambulatory Visit: Payer: 59 | Admitting: Physician Assistant

## 2018-07-07 VITALS — BP 114/84 | HR 74 | Ht 65.0 in | Wt 206.0 lb

## 2018-07-07 DIAGNOSIS — I712 Thoracic aortic aneurysm, without rupture, unspecified: Secondary | ICD-10-CM

## 2018-07-07 DIAGNOSIS — R002 Palpitations: Secondary | ICD-10-CM

## 2018-07-07 DIAGNOSIS — I359 Nonrheumatic aortic valve disorder, unspecified: Secondary | ICD-10-CM

## 2018-07-07 DIAGNOSIS — F3289 Other specified depressive episodes: Secondary | ICD-10-CM

## 2018-07-07 MED ORDER — METOPROLOL SUCCINATE ER 25 MG PO TB24: 25.0000 mg | ORAL_TABLET | Freq: Two times a day (BID) | ORAL | 3 refills | Status: DC

## 2018-07-07 MED FILL — TRIAMCINOLONE 0.1% CREAM: 0.1 | 30 days supply | Qty: 454 | Fill #1

## 2018-07-07 MED FILL — BUPROPION SR 150 MG TABLET: 150 | 30 days supply | Qty: 60 | Fill #0

## 2018-07-07 NOTE — Progress Notes (Signed)
Cardiology Office Note    Date:  07/09/2018   ID:  Melissa Atkinson, DOB Jul 25, 1970, MRN [id number]  PCP:  Silverio Decamp, MD  Cardiologist:  Dr. Stanford Breed   Chief Complaint  Patient presents with  . Follow-up    seen for Dr. Stanford Breed.    History of Present Illness:  Melissa Atkinson is a 48 y.o. female with past medical history of thoracic aortic aneurysm, polycythemia vera, history of palpitation and history of bicuspid aortic valve.  She was previously followed by [facility] cardiology service.  Stress echo in March 2013 showed normal LV function, probable bicuspid aortic valve, no stress-induced wall motion abnormality.  Monitor in March 2013 showed sinus rhythm with PACs and short bursts of SVT with no sustained arrhythmia.  MRA of the cerebral vasculature in February 2016 showed no aneurysm.  Echocardiogram in March 2018 showed bicuspid aortic valve, normal aortic function, aortic root at the upper limit of normal.  MRA in March 2018 showed ectasia of a sending thoracic aortic and mid aneurysm measuring 3.7 cm.  Patient was last seen by Dr. Stanford Breed on 05/12/2018, her blood pressure was borderline elevated, she was started on losartan 50 mg daily.  Was plan to repeat MRA in July 2020.  Patient presents today for evaluation of palpitation.  She woke up around midnight last night with a persistent palpitation.  She also had dizziness and shortness of breath at the time.  This is the first time she ever had a persistent palpitation that lasted several minutes.  Heart rate was in the 130s.  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  She did denies any recent exertional chest pain or shortness of breath.  Her symptom immediately went away after she felt her heart went back to normal rhythm.  I think it is possible she had either SVT, atrial fibrillation or atrial flutter.  Retrospectively, it is difficult for me to tell which  rhythm was the culprit.  I recommended a 30-day event monitor.  She is currently taking losartan 50 mg and Toprol-XL 25 mg at nighttime, I recommended to temporarily hold the losartan and change her Toprol-XL to 25 mg twice daily.  If her blood pressure is elevated on follow-up, we can potentially restart losartan to 25 mg daily.  Otherwise the increased dose of metoprolol likely will help decrease occurrence of the palpitation as well.  I also instructed her to decrease caffeine intake.  She can follow-up with Dr. Stanford Breed in 2 months.   Past Medical History:  Diagnosis Date  . Anxiety   . Back pain   . Bicuspid aortic valve   . Breast cancer ([place])   . Depression   . Joint pain   . Kidney cyst, acquired   . Obesity   . Palpitations   . Polycythemia vera ([place])   . Thoracic aortic aneurysm ([place])   . Thyroid nodule     Past Surgical History:  Procedure Laterality Date  . DILITATION & CURRETTAGE/HYSTROSCOPY WITH THERMACHOICE ABLATION N/A 12/28/2012   Procedure: DILATATION & CURETTAGE/HYSTEROSCOPY WITH THERMACHOICE ABLATION;  Surgeon: Cyril Mourning, MD;  Location: [facility];  Service: Gynecology;  Laterality: N/A;  . MASTECTOMY     bilateral  . PARTIAL NEPHRECTOMY     RT   . TONSILLECTOMY    . TUBAL LIGATION      Current Medications: Outpatient Medications Prior to Visit  Medication Sig Dispense  Refill  . buPROPion (WELLBUTRIN SR) 150 MG 12 hr tablet TAKE 1 TABLET BY MOUTH 2 TIMES DAILY. 60 tablet 0  . triamcinolone cream (KENALOG) 0.1 % APPLY TOPICALLY 2 TIMES DAILY. 454 g 11  . losartan (COZAAR) 50 MG tablet Take 1 tablet (50 mg total) by mouth daily. 90 tablet 3  . metoprolol succinate (TOPROL-XL) 25 MG 24 hr tablet TAKE 1 TABLET BY MOUTH DAILY 30 tablet 2   No facility-administered medications prior to visit.      Allergies:   Patient has no known allergies.   Social History   Socioeconomic History  . Marital status: Married    Spouse name: Timmothy Sours  . Number of children: 3   . Years of education: Not on file  . Highest education level: Not on file  Occupational History  . Occupation: Programmer, multimedia: [employer]  . Financial resource strain: Not on file  . Food insecurity:    Worry: Not on file    Inability: Not on file  . Transportation needs:    Medical: Not on file    Non-medical: Not on file  Tobacco Use  . Smoking status: Never Smoker  . Smokeless tobacco: Never Used  Substance and Sexual Activity  . Alcohol use: Yes    Comment: Occasional  . Drug use: No  . Sexual activity: Not on file  Lifestyle  . Physical activity:    Days per week: Not on file    Minutes per session: Not on file  . Stress: Not on file  Relationships  . Social connections:    Talks on phone: Not on file    Gets together: Not on file    Attends religious service: Not on file    Active member of club or organization: Not on file    Attends meetings of clubs or organizations: Not on file    Relationship status: Not on file  Other Topics Concern  . Not on file  Social History Narrative  . Not on file     Family History:  The patient's family history includes Alcoholism in her father and mother; Anxiety disorder in her mother; Bipolar disorder in her mother; COPD in her father; Cancer in her mother; Congestive Heart Failure in her father; Depression in her father and mother; Stroke in her maternal grandfather.   ROS:   Please see the history of present illness.    ROS All other systems reviewed and are negative.   PHYSICAL EXAM:   VS:  BP 114/84   Pulse 74   Ht 5\' 5"  (1.651 m)   Wt 206 lb (93.4 kg)   BMI 34.28 kg/m    GEN: Well nourished, well developed, in no acute distress  HEENT: normal  Neck: no JVD, carotid bruits, or masses Cardiac: RRR; no murmurs, rubs, or gallops,no edema  Respiratory:  clear to auscultation bilaterally, normal work of breathing GI: soft, nontender, nondistended, + BS MS: no deformity or atrophy  Skin: warm and dry,  no rash Neuro:  Alert and Oriented x 3, Strength and sensation are intact Psych: euthymic mood, full affect  Wt Readings from Last 3 Encounters:  07/07/18 206 lb (93.4 kg)  05/12/18 200 lb (90.7 kg)  05/10/18 202 lb (91.6 kg)      Studies/Labs Reviewed:   EKG:  EKG is ordered today.  The ekg ordered today demonstrates NSR without significant ST-T wave changes  Recent Labs: 05/10/2018: ALT 16; Brain Natriuretic Peptide  17; Hemoglobin 13.6; Magnesium 1.8; Platelets 189; TSH 1.60 05/23/2018: BUN 13; Creatinine, Ser 0.79; Potassium 4.4; Sodium 138   Lipid Panel    Component Value Date/Time   CHOL 217 (H) 08/22/2017 0826   TRIG 96 08/22/2017 0826   HDL 88 08/22/2017 0826   CHOLHDL 2.1 08/09/2016 1108   VLDL 13 07/12/2014 0945   LDLCALC 110 (H) 08/22/2017 0826    Additional studies/ records that were reviewed today include:   Echo 08/26/2016 Study Conclusions  - Left ventricle: The cavity size was normal. Systolic function was   normal. Wall motion was normal; there were no regional wall   motion abnormalities. Left ventricular diastolic function   parameters were normal. - Aortic valve: Poorly visualized. Bicuspid; normal thickness   leaflets. Mean gradient (S): 9 mm Hg. Peak gradient (S): 16 mm   Hg. - Aorta: Aortic root dimension: 37 mm (ED). - Aortic root: The aortic root was trivially dilated. - Mitral valve: There was trivial regurgitation. - Pulmonary arteries: Systolic pressure could not be accurately   estimated.   ASSESSMENT:    1. Palpitations   2. AVD (aortic valve disease)   3. Thoracic aortic aneurysm without rupture ([place])      PLAN:  In order of problems listed above:  1. Palpitation: I instructed the patient to cut back on caffeine.  Recent episode could be SVT, atrial fibrillation or atrial flutter.  She described as a single prolonged episode of tachycardia palpitation.  I will increase her metoprolol to 25 mg twice daily while asking her to hold  her losartan.  We may consider restarting losartan at a lower dose in the future if her blood pressure is uncontrolled  2. Thoracic aortic aneurysm: Plan for repeat imaging in July 2020  3. History of bicuspid aortic valve: Asymptomatic.  Will defer to Dr. Stanford Breed regarding the timing to repeat echocardiogram.    Medication Adjustments/Labs and Tests Ordered: Current medicines are reviewed at length with the patient today.  Concerns regarding medicines are outlined above.  Medication changes, Labs and Tests ordered today are listed in the Patient Instructions below. Patient Instructions  Medication Instructions:  STOP LOSARTAN  INCREASE METOPROLOL TO 25 MG TWICE DAILY If you need a refill on your cardiac medications before your next appointment, please call your pharmacy.   Lab work: If you have labs (blood work) drawn today and your tests are completely normal, you will receive your results only by: Marland Kitchen MyChart Message (if you have MyChart) OR . A paper copy in the mail If you have any lab test that is abnormal or we need to change your treatment, we will call you to review the results.  Testing/Procedures: Your physician has recommended that you wear an event monitor. Event monitors are medical devices that record the heart's electrical activity. Doctors most often [place] these monitors to diagnose arrhythmias. Arrhythmias are problems with the speed or rhythm of the heartbeat. The monitor is a small, portable device. You can wear one while you do your normal daily activities. This is usually used to diagnose what is causing palpitations/syncope (passing out).    Follow-Up: At [facility], you and your health needs are our priority.  As part of our continuing mission to provide you with exceptional heart care, we have created designated Provider Care Teams.  These Care Teams include your primary Cardiologist (physician) and Advanced Practice Providers (APPs -  Physician Assistants and  Nurse Practitioners) who all work together to provide you with the  care you need, when you need it.  Your physician recommends that you schedule a follow-up appointment in: [place], [place], [place]  07/09/2018 11:24 PM    [facility] [place], [address], [place]  [address] Phone: [phone]; Fax: [phone]

## 2018-07-07 NOTE — Patient Instructions (Signed)
Medication Instructions:  STOP LOSARTAN  INCREASE METOPROLOL TO 25 MG TWICE DAILY If you need a refill on your cardiac medications before your next appointment, please call your pharmacy.   Lab work: If you have labs (blood work) drawn today and your tests are completely normal, you will receive your results only by: Marland Kitchen MyChart Message (if you have MyChart) OR . A paper copy in the mail If you have any lab test that is abnormal or we need to change your treatment, we will call you to review the results.  Testing/Procedures: Your physician has recommended that you wear an event monitor. Event monitors are medical devices that record the heart's electrical activity. Doctors most often [place] these monitors to diagnose arrhythmias. Arrhythmias are problems with the speed or rhythm of the heartbeat. The monitor is a small, portable device. You can wear one while you do your normal daily activities. This is usually used to diagnose what is causing palpitations/syncope (passing out).    Follow-Up: At [facility], you and your health needs are our priority.  As part of our continuing mission to provide you with exceptional heart care, we have created designated Provider Care Teams.  These Care Teams include your primary Cardiologist (physician) and Advanced Practice Providers (APPs -  Physician Assistants and Nurse Practitioners) who all work together to provide you with the care you need, when you need it.  Your physician recommends that you schedule a follow-up appointment in: [place]

## 2018-07-09 ENCOUNTER — Encounter: Payer: Self-pay | Admitting: Physician Assistant

## 2018-07-12 ENCOUNTER — Ambulatory Visit (INDEPENDENT_AMBULATORY_CARE_PROVIDER_SITE_OTHER): Payer: 59

## 2018-07-12 ENCOUNTER — Other Ambulatory Visit: Payer: Self-pay | Admitting: Physician Assistant

## 2018-07-12 DIAGNOSIS — R0602 Shortness of breath: Secondary | ICD-10-CM | POA: Diagnosis not present

## 2018-07-12 DIAGNOSIS — R002 Palpitations: Secondary | ICD-10-CM | POA: Diagnosis not present

## 2018-07-12 DIAGNOSIS — R42 Dizziness and giddiness: Secondary | ICD-10-CM

## 2018-08-02 ENCOUNTER — Ambulatory Visit: Payer: 59 | Admitting: Cardiology

## 2018-08-09 MED FILL — METOPROLOL SUCCINATE ER 25: 25 | 90 days supply | Qty: 180 | Fill #0

## 2018-08-25 NOTE — Progress Notes (Signed)
HPI: FU bicuspid aortic valve. Previously followed at [facility] cardiology. Stress echocardiogram in March of 2013 showed normal LV function, probable bicuspid aortic valve and no stress-induced wall motion abnormalities. Monitor in March of 2013 showed sinus rhythm with PACs and short bursts of SVT but no sustained arrhythmias. MRA of cerebral vasculature February 2016 showed no aneurysm.Echocardiogram March 2018 showed normal LV function, bicuspid aortic valve, aortic root at upper limits of normal. MRA March 2018 showed ectasia of ascending thoracic aortic aneurysm measuring 3.7 cm.  Seen recently with palpitations.  Monitor January 2020 showed sinus rhythm, sinus tachycardia, ectopic atrial rhythm, brief ectopic atrial tachycardia and 7 beats nonsustained ventricular tachycardia.  Since she was last seenshe has multiple complaints including increased dyspnea on exertion.  She states her weight can fluctuate significantly.  She has episodes when her blood pressure increases to 150/100 for approximately 16 hours.  When this happens she feels dizzy and has some chest heaviness.  It was then returned to normal.  She continues to have palpitations.  No syncope.  She occasionally feels she has pedal edema.  She does not have exertional chest pain.  Current Outpatient Medications  Medication Sig Dispense Refill  . aspirin 81 MG tablet Take 81 mg by mouth daily.    Marland Kitchen buPROPion (WELLBUTRIN SR) 150 MG 12 hr tablet TAKE 1 TABLET BY MOUTH 2 TIMES DAILY. 60 tablet 0  . metoprolol succinate (TOPROL-XL) 25 MG 24 hr tablet Take 1 tablet (25 mg total) by mouth 2 (two) times daily. 180 tablet 3  . triamcinolone cream (KENALOG) 0.1 % APPLY TOPICALLY 2 TIMES DAILY. 454 g 11  . Vitamin D, Cholecalciferol, 50 MCG (2000 UT) CAPS Take 2,000 Units by mouth daily.     No current facility-administered medications for this visit.      Past Medical History:  Diagnosis Date  . Anxiety   . Back pain   .  Bicuspid aortic valve   . Breast cancer ([place])   . Depression   . Joint pain   . Kidney cyst, acquired   . Obesity   . Palpitations   . Polycythemia vera ([place])   . Thoracic aortic aneurysm ([place])   . Thyroid nodule     Past Surgical History:  Procedure Laterality Date  . DILITATION & CURRETTAGE/HYSTROSCOPY WITH THERMACHOICE ABLATION N/A 12/28/2012   Procedure: DILATATION & CURETTAGE/HYSTEROSCOPY WITH THERMACHOICE ABLATION;  Surgeon: Cyril Mourning, MD;  Location: [facility];  Service: Gynecology;  Laterality: N/A;  . MASTECTOMY     bilateral  . PARTIAL NEPHRECTOMY     RT   . TONSILLECTOMY    . TUBAL LIGATION      Social History   Socioeconomic History  . Marital status: Married    Spouse name: Timmothy Sours  . Number of children: 3  . Years of education: Not on file  . Highest education level: Not on file  Occupational History  . Occupation: Programmer, multimedia: [employer]  . Financial resource strain: Not on file  . Food insecurity:    Worry: Not on file    Inability: Not on file  . Transportation needs:    Medical: Not on file    Non-medical: Not on file  Tobacco Use  . Smoking status: Never Smoker  . Smokeless tobacco: Never Used  Substance and Sexual Activity  . Alcohol use: Yes    Comment: Occasional  . Drug use: No  . Sexual activity: Not  on file  Lifestyle  . Physical activity:    Days per week: Not on file    Minutes per session: Not on file  . Stress: Not on file  Relationships  . Social connections:    Talks on phone: Not on file    Gets together: Not on file    Attends religious service: Not on file    Active member of club or organization: Not on file    Attends meetings of clubs or organizations: Not on file    Relationship status: Not on file  . Intimate partner violence:    Fear of current or ex partner: Not on file    Emotionally abused: Not on file    Physically abused: Not on file    Forced sexual activity: Not on file  Other Topics  Concern  . Not on file  Social History Narrative  . Not on file    Family History  Problem Relation Age of Onset  . Cancer Mother        lung  . Depression Mother   . Anxiety disorder Mother   . Bipolar disorder Mother   . Alcoholism Mother   . COPD Father   . Congestive Heart Failure Father   . Depression Father   . Alcoholism Father   . Stroke Maternal Grandfather     ROS: no fevers or chills, productive cough, hemoptysis, dysphasia, odynophagia, melena, hematochezia, dysuria, hematuria, rash, seizure activity, orthopnea, PND, pedal edema, claudication. Remaining systems are negative.  Physical Exam: Well-developed well-nourished in no acute distress.  Skin is warm and dry.  HEENT is normal.  Neck is supple.  Chest is clear to auscultation with normal expansion.  Cardiovascular exam is regular rate and rhythm.  Abdominal exam nontender or distended. No masses palpated. Extremities show no edema. neuro grossly intact  A/P  1 hypertension-patient's blood pressure is controlled today.  However she states she is having spikes up to 150/100.  It will continue for approximately 12 to 16 hours and then improved.  I will schedule renal Dopplers to exclude renal artery stenosis.  She also feels as though she has excess volume.  We will add HCTZ 12.5 mg daily.  Check potassium and renal function in 1 week.  Follow blood pressure closely and adjust regimen as needed.  Can consider evaluation for pheochromocytoma in the future if symptoms persist.  2 history of thoracic aortic ectasia-given history of bicuspid aortic valve we will plan to repeat MRA.  3 bicuspid aortic valve-we will repeat echocardiogram.  4 palpitations-patient continues to have palpitations.  Etiology unclear but no significant arrhythmias on monitor.  We discussed the possibility of an apple watch to record strips.  Increase metoprolol to 50 mg twice daily and follow.  Kirk Ruths, MD

## 2018-09-01 ENCOUNTER — Encounter: Payer: Self-pay | Admitting: Cardiology

## 2018-09-01 ENCOUNTER — Ambulatory Visit: Payer: 59 | Admitting: Cardiology

## 2018-09-01 VITALS — BP 122/84 | HR 79 | Ht 69.0 in | Wt 208.0 lb

## 2018-09-01 DIAGNOSIS — I359 Nonrheumatic aortic valve disorder, unspecified: Secondary | ICD-10-CM | POA: Diagnosis not present

## 2018-09-01 DIAGNOSIS — Q231 Congenital insufficiency of aortic valve: Secondary | ICD-10-CM

## 2018-09-01 DIAGNOSIS — I712 Thoracic aortic aneurysm, without rupture, unspecified: Secondary | ICD-10-CM

## 2018-09-01 DIAGNOSIS — R002 Palpitations: Secondary | ICD-10-CM

## 2018-09-01 DIAGNOSIS — I10 Essential (primary) hypertension: Secondary | ICD-10-CM

## 2018-09-01 MED ORDER — METOPROLOL SUCCINATE ER 50 MG PO TB24: 50.0000 mg | ORAL_TABLET | Freq: Two times a day (BID) | ORAL | 3 refills | Status: DC

## 2018-09-01 MED ORDER — HYDROCHLOROTHIAZIDE 12.5 MG PO CAPS: 12.5000 mg | ORAL_CAPSULE | Freq: Every day | ORAL | 3 refills | Status: DC

## 2018-09-01 MED FILL — HYDROCHLOROTHIAZIDE 12.5 MG: 12.5 | 90 days supply | Qty: 90 | Fill #0

## 2018-09-01 NOTE — Patient Instructions (Signed)
Medication Instructions:  INCREASE METOPROLOL TO 50 MG TWICE DAILY= 2 OF THE 25 MG TABLETS TWICE DAILY  START HCTZ 12.5 MG ONCE DAILY If you need a refill on your cardiac medications before your next appointment, please call your pharmacy.   Lab work: Your physician recommends that you return for lab work in: [place] If you have labs (blood work) drawn today and your tests are completely normal, you will receive your results only by: Marland Kitchen MyChart Message (if you have MyChart) OR . A paper copy in the mail If you have any lab test that is abnormal or we need to change your treatment, we will call you to review the results.  Testing/Procedures: Your physician has requested that you have an echocardiogram. Echocardiography is a painless test that uses sound waves to create images of your heart. It provides your doctor with information about the size and shape of your heart and how well your heart's chambers and valves are working. This procedure takes approximately one hour. There are no restrictions for this procedure.  [place] has requested that you have a renal artery duplex. During this test, an ultrasound is used to evaluate blood flow to the kidneys. Allow one hour for this exam. Do not eat after midnight the day before and avoid carbonated beverages. Take your medications as you usually do.  [place] OFFICE  MRA OF THE CHEST W/WO TO FOLLOW UP ON DILATED AORTIC ROOT AT [place]  Follow-Up: At [facility], you and your health needs are our priority.  As part of our continuing mission to provide you with exceptional heart care, we have created designated Provider Care Teams.  These Care Teams include your primary Cardiologist (physician) and Advanced Practice Providers (APPs -  Physician Assistants and Nurse Practitioners) who all work together to provide you with the care you need, when you need it. Your physician recommends that you schedule  a follow-up appointment in: [place]

## 2018-09-07 ENCOUNTER — Other Ambulatory Visit: Payer: Self-pay | Admitting: Sports Medicine

## 2018-09-07 DIAGNOSIS — F3289 Other specified depressive episodes: Secondary | ICD-10-CM

## 2018-09-07 MED FILL — BUPROPION HCL SR 150 MG TAB: 150 | 30 days supply | Qty: 60 | Fill #0

## 2018-09-07 MED FILL — TRIAMCINOLONE 0.1% CREAM: 0.1 | 30 days supply | Qty: 454 | Fill #2

## 2018-09-08 ENCOUNTER — Ambulatory Visit
Admission: RE | Admit: 2018-09-08 | Discharge: 2018-09-08 | Disposition: A | Discharge status: Home / Self Care | Payer: 59 | Source: Ambulatory Visit | Attending: Cardiology | Admitting: Cardiology

## 2018-09-08 ENCOUNTER — Other Ambulatory Visit: Payer: Self-pay

## 2018-09-08 ENCOUNTER — Ambulatory Visit (HOSPITAL_COMMUNITY): Payer: 59 | Attending: Cardiology

## 2018-09-08 DIAGNOSIS — I712 Thoracic aortic aneurysm, without rupture, unspecified: Secondary | ICD-10-CM

## 2018-09-08 DIAGNOSIS — I77810 Thoracic aortic ectasia: Secondary | ICD-10-CM | POA: Diagnosis not present

## 2018-09-08 DIAGNOSIS — Q231 Congenital insufficiency of aortic valve: Secondary | ICD-10-CM | POA: Insufficient documentation

## 2018-09-08 DIAGNOSIS — I359 Nonrheumatic aortic valve disorder, unspecified: Secondary | ICD-10-CM | POA: Diagnosis not present

## 2018-09-08 MED ORDER — GADOBENATE DIMEGLUMINE 529 MG/ML IV SOLN
15.0000 mL | Freq: Once | INTRAVENOUS | Status: AC | PRN
  Administered 2018-09-08: 15 mL via INTRAVENOUS

## 2018-09-14 ENCOUNTER — Other Ambulatory Visit: Payer: Self-pay | Admitting: Sports Medicine

## 2018-09-14 DIAGNOSIS — F3289 Other specified depressive episodes: Secondary | ICD-10-CM

## 2018-09-14 MED FILL — METOPROLOL SUCCINATE ER 50: 50 | 90 days supply | Qty: 180 | Fill #0

## 2018-09-18 ENCOUNTER — Ambulatory Visit (HOSPITAL_COMMUNITY)
Admission: RE | Admit: 2018-09-18 | Discharge: 2018-09-18 | Disposition: A | Discharge status: Home / Self Care | Payer: 59 | Source: Ambulatory Visit | Attending: Internal Medicine | Admitting: Internal Medicine

## 2018-09-18 DIAGNOSIS — I10 Essential (primary) hypertension: Secondary | ICD-10-CM | POA: Insufficient documentation

## 2018-09-18 LAB — BASIC METABOLIC PANEL
BUN/Creatinine Ratio: 18 (ref 9–23)
BUN: 14 mg/dL (ref 6–24)
CO2: 26 mmol/L (ref 20–29)
Calcium: 9.4 mg/dL (ref 8.7–10.2)
Chloride: 104 mmol/L (ref 96–106)
Creatinine, Ser: 0.79 mg/dL (ref 0.57–1.00)
GFR calc Af Amer: 102 mL/min/{1.73_m2} (ref >59)
GFR calc non Af Amer: 88 mL/min/{1.73_m2} (ref >59)
Glucose: 93 mg/dL (ref 65–99)
Potassium: 4.5 mmol/L (ref 3.5–5.2)
Sodium: 145 mmol/L — ABNORMAL HIGH (ref 134–144)

## 2018-11-29 MED FILL — HYDROCHLOROTHIAZIDE 12.5 MG: 12.5 | 90 days supply | Qty: 90 | Fill #1

## 2018-12-08 ENCOUNTER — Telehealth: Payer: Self-pay

## 2018-12-08 NOTE — Telephone Encounter (Signed)

## 2018-12-11 ENCOUNTER — Telehealth: Payer: Self-pay | Admitting: Cardiology

## 2018-12-11 NOTE — Telephone Encounter (Signed)
LVM for pre reg, sent message to email & my chart

## 2018-12-13 NOTE — Progress Notes (Signed)
Virtual Visit via telephone Note (changed from video at patient request)   This visit type was conducted due to national recommendations for restrictions regarding the COVID-19 Pandemic (e.g. social distancing) in an effort to limit this patient's exposure and mitigate transmission in our community.  Due to her co-morbid illnesses, this patient is at least at moderate risk for complications without adequate follow up.  This format is felt to be most appropriate for this patient at this time.  All issues noted in this document were discussed and addressed.  A limited physical exam was performed with this format.  Please refer to the patient's chart for her consent to telehealth for [facility].   Date:  12/15/2018   ID:  Melissa Atkinson, DOB July 22, 1970, MRN [id number]  Patient Location: Home Provider Location: Home  PCP:  Silverio Decamp, MD  Cardiologist:  Dr Stanford Breed  Evaluation Performed:  Follow-Up Visit  Chief Complaint:  FU bicuspid aortic valve  History of Present Illness:    FU bicuspid aortic valve. Stress echocardiogram in March of 2013 showed normal LV function, probable bicuspid aortic valve and no stress-induced wall motion abnormalities. Monitor in March of 2013 showed sinus rhythm with PACs and short bursts of SVT but no sustained arrhythmias. MRA of cerebral vasculature February 2016 showed no aneurysm.Monitor January 2020 showed sinus rhythm, sinus tachycardia, ectopic atrial rhythm, brief ectopic atrial tachycardia and 7 beats nonsustained ventricular tachycardia. Echocardiogram repeated March 2020 and showed normal LV function, mild aortic stenosis with mean gradient 8 mmHg.  Renal Dopplers March 2020 showed no renal artery stenosis.  MRA March 2020 showed mildly dilated thoracic aorta at 3.8 cm.  Since she was last seenshe has rare palpitations.  She denies dyspnea, chest pain or syncope.  Diastolic blood pressure running 85-90.  The patient does not have  symptoms concerning for COVID-19 infection (fever, chills, cough, or new shortness of breath).    Past Medical History:  Diagnosis Date  . Anxiety   . Back pain   . Bicuspid aortic valve   . Breast cancer ([place])   . Depression   . Joint pain   . Kidney cyst, acquired   . Obesity   . Palpitations   . Polycythemia vera ([place])   . Thoracic aortic aneurysm ([place])   . Thyroid nodule    Past Surgical History:  Procedure Laterality Date  . DILITATION & CURRETTAGE/HYSTROSCOPY WITH THERMACHOICE ABLATION N/A 12/28/2012   Procedure: DILATATION & CURETTAGE/HYSTEROSCOPY WITH THERMACHOICE ABLATION;  Surgeon: Cyril Mourning, MD;  Location: [facility];  Service: Gynecology;  Laterality: N/A;  . MASTECTOMY     bilateral  . PARTIAL NEPHRECTOMY     RT   . TONSILLECTOMY    . TUBAL LIGATION       Current Meds  Medication Sig  . aspirin 81 MG tablet Take 81 mg by mouth daily.  . hydrochlorothiazide (MICROZIDE) 12.5 MG capsule Take 1 capsule (12.5 mg total) by mouth daily.  . metoprolol succinate (TOPROL-XL) 50 MG 24 hr tablet Take 1 tablet (50 mg total) by mouth 2 (two) times daily.  Marland Kitchen triamcinolone cream (KENALOG) 0.1 % APPLY TOPICALLY 2 TIMES DAILY.  Marland Kitchen Vitamin D, Cholecalciferol, 50 MCG (2000 UT) CAPS Take 2,000 Units by mouth daily.     Allergies:   Patient has no known allergies.   Social History   Tobacco Use  . Smoking status: Never Smoker  . Smokeless tobacco: Never Used  Substance Use Topics  . Alcohol use: Yes  Comment: Occasional  . Drug use: No     Family Hx: The patient's family history includes Alcoholism in her father and mother; Anxiety disorder in her mother; Bipolar disorder in her mother; COPD in her father; Cancer in her mother; Congestive Heart Failure in her father; Depression in her father and mother; Stroke in her maternal grandfather.  ROS:   Please see the history of present illness.    No fevers, chills or productive cough. All other systems reviewed and are  negative.  Recent Labs: 05/10/2018: ALT 16; Brain Natriuretic Peptide 17; Hemoglobin 13.6; Magnesium 1.8; Platelets 189; TSH 1.60 09/18/2018: BUN 14; Creatinine, Ser 0.79; Potassium 4.5; Sodium 145   Recent Lipid Panel Lab Results  Component Value Date/Time   CHOL 217 (H) 08/22/2017 08:26 AM   TRIG 96 08/22/2017 08:26 AM   HDL 88 08/22/2017 08:26 AM   CHOLHDL 2.1 08/09/2016 11:08 AM   LDLCALC 110 (H) 08/22/2017 08:26 AM    Wt Readings from Last 3 Encounters:  12/15/18 205 lb (93 kg)  09/01/18 208 lb (94.3 kg)  07/07/18 206 lb (93.4 kg)     Objective:    Vital Signs:  BP 126/88   Pulse 73   Ht 5' 5"  (1.651 m)   Wt 205 lb (93 kg)   BMI 34.11 kg/m    VITAL SIGNS:  reviewed  No acute distress Answers questions appropriately Normal affect Remainder physical examination not performed (telehealth visit; coronavirus pandemic)  ASSESSMENT & PLAN:    1. Hypertension-patient's blood pressure is mildly elevated with diastolic 10-27.  We will add losartan 25 mg daily.  Increase as needed.  Check potassium and renal function in 1 week.  Previous renal Doppler showed no significant stenosis. 2. Bicuspid aortic valve-minimal aortic stenosis on most recent echocardiogram.  We will plan follow-up studies in the future. 3. Dilated aortic root-3.8 cm on most recent MRA.  Will repeat study March 2022. 4. Palpitations-plan to continue metoprolol at present dose.    COVID-19 Education: The importance of social distancing was discussed today.  Time:   Today, I have spent 17 minutes with the patient with telehealth technology discussing the above problems.     Medication Adjustments/Labs and Tests Ordered: Current medicines are reviewed at length with the patient today.  Concerns regarding medicines are outlined above.   Tests Ordered: No orders of the defined types were placed in this encounter.   Medication Changes: No orders of the defined types were placed in this encounter.    Follow Up:  Virtual Visit or In Person in 6 month(s)  Signed, Kirk Ruths, MD  12/15/2018 7:48 AM    [place]

## 2018-12-14 NOTE — Telephone Encounter (Signed)
smartphone/ consent/ my chart/ pre reg completed °

## 2018-12-15 ENCOUNTER — Telehealth (INDEPENDENT_AMBULATORY_CARE_PROVIDER_SITE_OTHER): Payer: 59 | Admitting: Cardiology

## 2018-12-15 ENCOUNTER — Encounter: Payer: Self-pay | Admitting: Cardiology

## 2018-12-15 VITALS — BP 126/88 | HR 73 | Ht 65.0 in | Wt 205.0 lb

## 2018-12-15 DIAGNOSIS — I712 Thoracic aortic aneurysm, without rupture, unspecified: Secondary | ICD-10-CM

## 2018-12-15 DIAGNOSIS — Q231 Congenital insufficiency of aortic valve: Secondary | ICD-10-CM | POA: Diagnosis not present

## 2018-12-15 DIAGNOSIS — I10 Essential (primary) hypertension: Secondary | ICD-10-CM

## 2018-12-15 MED ORDER — LOSARTAN POTASSIUM 25 MG PO TABS: 25.0000 mg | ORAL_TABLET | Freq: Every day | ORAL | 3 refills | Status: DC

## 2018-12-15 MED FILL — LOSARTAN POTASSIUM 25 MG TA: 25 | 90 days supply | Qty: 90 | Fill #0

## 2018-12-15 NOTE — Patient Instructions (Signed)
Medication Instructions:  START LOSARTAN 25 MG ONCE DAILY If you need a refill on your cardiac medications before your next appointment, please call your pharmacy.   Lab work: Your physician recommends that you return for lab work in: [place] If you have labs (blood work) drawn today and your tests are completely normal, you will receive your results only by: Marland Kitchen MyChart Message (if you have MyChart) OR . A paper copy in the mail If you have any lab test that is abnormal or we need to change your treatment, we will call you to review the results.  Follow-Up: At [facility] you and your health needs are our priority.  As part of our continuing mission to provide you with exceptional heart care, we have created designated Provider Care Teams.  These Care Teams include your primary Cardiologist (physician) and Advanced Practice Providers (APPs -  Physician Assistants and Nurse Practitioners) who all work together to provide you with the care you need, when you need it. You will need a follow up appointment in 6 months.  Please call our office 2 months in advance to schedule this appointment.  You may see Kirk Ruths MD or one of the following Advanced Practice Providers on your designated Care Team:   Kerin Ransom, PA-C Roby Lofts, [place] . Sande Rives, PA-C

## 2019-01-02 DIAGNOSIS — I10 Essential (primary) hypertension: Secondary | ICD-10-CM | POA: Diagnosis not present

## 2019-01-02 MED FILL — METOPROLOL SUCCINATE ER 50: 50 | 90 days supply | Qty: 180 | Fill #1

## 2019-01-02 MED FILL — TRIAMCINOLONE 0.1% CREAM: 0.1 | 30 days supply | Qty: 454 | Fill #3

## 2019-01-03 LAB — BASIC METABOLIC PANEL
BUN/Creatinine Ratio: 19 (ref 9–23)
BUN: 15 mg/dL (ref 6–24)
CO2: 23 mmol/L (ref 20–29)
Calcium: 9.3 mg/dL (ref 8.7–10.2)
Chloride: 103 mmol/L (ref 96–106)
Creatinine, Ser: 0.8 mg/dL (ref 0.57–1.00)
GFR calc Af Amer: 100 mL/min/{1.73_m2} (ref >59)
GFR calc non Af Amer: 87 mL/min/{1.73_m2} (ref >59)
Glucose: 83 mg/dL (ref 65–99)
Potassium: 4 mmol/L (ref 3.5–5.2)
Sodium: 143 mmol/L (ref 134–144)

## 2019-02-15 ENCOUNTER — Encounter: Payer: Self-pay | Admitting: Sports Medicine

## 2019-03-08 MED FILL — HYDROCHLOROTHIAZIDE 12.5 MG: 12.5 | 90 days supply | Qty: 90 | Fill #2

## 2019-04-17 ENCOUNTER — Other Ambulatory Visit: Payer: Self-pay | Admitting: Sports Medicine

## 2019-04-17 MED FILL — TRIAMCINOLONE 0.1% CREAM: 0.1 | 30 days supply | Qty: 454 | Fill #0

## 2019-04-17 MED FILL — METOPROLOL SUCCINATE ER 50: 50 | 90 days supply | Qty: 180 | Fill #2

## 2019-06-11 MED FILL — HYDROCHLOROTHIAZIDE 12.5 MG: 12.5 | 90 days supply | Qty: 90 | Fill #3

## 2019-06-25 ENCOUNTER — Telehealth: Payer: Self-pay

## 2019-06-25 NOTE — Telephone Encounter (Signed)
Looks like afib vs frequent atrial ectopy. She has a history of atrial tachycardia. Agree best is for her to continue her toprol and come in for an EKG.   Evalina Field, MD

## 2019-06-25 NOTE — Telephone Encounter (Signed)
Pt aware of the following:  "Looks like afib vs frequent atrial ectopy. She has a history of atrial tachycardia. Agree best is for her to continue her toprol and come in for an EKG.   Evalina Field, MD"   She verbalized understanding and plans to present for appt 12/29 with Eulas Post, PA-C

## 2019-06-25 NOTE — Telephone Encounter (Signed)
Contacted pt regarding the following 12/27 Mychart message:  "Hi Dr. Stanford Breed hope you had a wonderful Christmas.  Saturday night I felt restless and unable to sleep. I noticed some palpitations so I put [name]'s Apple Watch on and it detected afib... I had taken my metoprolol at around 27mdnight and took another one at 3a to see if it would bring my rate down/ or help put me back into NSR.  I will call on Monday for an appointment.   I know at one of my appointments, you said that if I ever could catch anything on the watch to record and send to you.  Thanks- Melissa Atkinson   Patient attached three images of Apple watch EKG readings to message. Pt states she was symptomatic Saturday night (early Sunday morning) and woke up out of her sleep like she was having a 'bad dream' but with nausea and palpitations. She used her spouse's Apple Watch to check her rhythm which detected Afib but she is not sure if this is accurate. She states she was back in NSR around 0400. She is on her Toprol-XL 50 mg BID and current BP 136/96 and HR 78. Pt would like an appt. Informed her that Dr. CStanford Breedout of office this week but encounter to be routed to him and to the DOD, Dr. OAudie Box to advise. Appt set for 12/29 at 11:30am with [name] PA-C whom she has seen in the past. Pt agreeable

## 2019-06-26 ENCOUNTER — Encounter: Payer: Self-pay | Admitting: Physician Assistant

## 2019-06-26 ENCOUNTER — Ambulatory Visit (INDEPENDENT_AMBULATORY_CARE_PROVIDER_SITE_OTHER): Payer: 59 | Admitting: Physician Assistant

## 2019-06-26 ENCOUNTER — Other Ambulatory Visit: Payer: Self-pay

## 2019-06-26 VITALS — BP 125/88 | HR 78 | Ht 65.0 in | Wt 207.6 lb

## 2019-06-26 DIAGNOSIS — Q231 Congenital insufficiency of aortic valve: Secondary | ICD-10-CM

## 2019-06-26 DIAGNOSIS — R002 Palpitations: Secondary | ICD-10-CM

## 2019-06-26 DIAGNOSIS — I712 Thoracic aortic aneurysm, without rupture, unspecified: Secondary | ICD-10-CM

## 2019-06-26 NOTE — Telephone Encounter (Signed)
Strips reviewed; they show sinus with pacs and not atrial fibrillation; continue [place] with Almyra Deforest but make pt and Isaac Laud aware I have reviewed strips Kirk Ruths

## 2019-06-26 NOTE — Progress Notes (Signed)
Cardiology Office Note:    Date:  06/28/2019   ID:  Melissa Atkinson, DOB 1970-08-18, MRN [id number]  PCP:  Silverio Decamp, MD  Cardiologist:  Kirk Ruths, MD  Electrophysiologist:  None   Referring MD: Silverio Decamp,*   Chief Complaint  Patient presents with  . Follow-up    seen for Dr. Stanford Breed.   . Palpitations    History of Present Illness:    Melissa Atkinson is a 48 y.o. female with a hx of bicuspid aortic valve, depression, anxiety, polycythemia vera, thoracic aortic aneurysm and history of thyroid nodule.  Stress echo in March 2013 showed normal LV function, probable bicuspid aortic valve and no stress-induced  wall motion normality.  Her monitor in March 2013 showed sinus rhythm with PACs and short bursts of SVT but no sustained arrhythmia.  MRA of the brain in February 2016 showed no aneurysm.  Echocardiogram in March 2018 showed normal LV function, bicuspid aortic valve, aortic root at upper limit of normal.  She was found to have ascending thoracic aortic aneurysm measuring up to 3.7 on MRA in March 2018.  Repeat her monitor in January 2020 for palpitation show sinus rhythm, sinus tachycardia, ectopic atrial rhythm, brief ectopic atrial tachycardia and 7 beats of nonsustained VT.  Patient was last seen by Dr. Stanford Breed in March 2020 at which time she complained of increasing dyspnea on exertion and uncontrolled high blood pressure.  Her metoprolol was increased to 50 mg twice daily and low-dose hydrochlorothiazide 12.5 mg daily was added to her medical regimen.  Repeat echocardiogram obtained on 09/08/2018 showed EF 60 to 65%, mild stenosis of the aortic valve, mild dilatation of the ascending aorta measuring at 36 mm.  Subsequent MRI of the chest obtained on 09/08/2018 showed 3.8 cm mild dilatation of the tubular portion of thoracic aorta, otherwise no significant abnormality noted on nonvascular portion of the image.  Renal artery Doppler obtained on 09/18/2018  showed no significant renal artery stenosis.  Patient presents today for cardiology office visit.  She mentioned on the night of 06/23/2019 she had a episode of restlessness and inability to sleep.  She says she had a nightmare and might have woke up due to the nightmare.  However after she woke up she noticed her heart rate was irregular and was pounding.  She obtained some telemetry strips from her husband's iPhone and sent over the initial heart rate recording to Dr. Stanford Breed.  The initial strip was reviewed by Dr. Audie Box who felt she had either atrial ectopy versus atrial fibrillation.  The strip was later reviewed by Dr. Stanford Breed who felt she had sinus rhythm with PACs.  This is the same rhythm she had during previous heart monitor in January 2020.  Since then, she has not had any recurrent episode.  She did take extra dose of metoprolol when she woke up.  For the time being, I recommended continue on Toprol-XL 50 mg twice daily with additional 25 mg on as-needed basis for recurrent palpitation.  I reassured her that PAC was harmless and that there was currently no evidence of atrial fibrillation at this time.  She can follow-up with Dr. Stanford Breed in 6 months.  If her symptoms become more frequent, then I would recommend increase Toprol-XL doses permanently.  Past Medical History:  Diagnosis Date  . Anxiety   . Back pain   . Bicuspid aortic valve   . Breast cancer ([place])   . Depression   . Joint pain   .  Kidney cyst, acquired   . Obesity   . Palpitations   . Polycythemia vera ([place])   . Thoracic aortic aneurysm ([place])   . Thyroid nodule     Past Surgical History:  Procedure Laterality Date  . DILITATION & CURRETTAGE/HYSTROSCOPY WITH THERMACHOICE ABLATION N/A 12/28/2012   Procedure: DILATATION & CURETTAGE/HYSTEROSCOPY WITH THERMACHOICE ABLATION;  Surgeon: Cyril Mourning, MD;  Location: [facility];  Service: Gynecology;  Laterality: N/A;  . MASTECTOMY     bilateral  . PARTIAL NEPHRECTOMY      RT   . TONSILLECTOMY    . TUBAL LIGATION      Current Medications: Current Meds  Medication Sig  . aspirin 81 MG tablet Take 81 mg by mouth daily.  . metoprolol succinate (TOPROL-XL) 50 MG 24 hr tablet Take 1 tablet (50 mg total) by mouth 2 (two) times daily.  Marland Kitchen triamcinolone cream (KENALOG) 0.1 % APPLY TOPICALLY 2 TIMES DAILY.  Marland Kitchen Vitamin D, Cholecalciferol, 50 MCG (2000 UT) CAPS Take 2,000 Units by mouth daily.     Allergies:   Patient has no known allergies.   Social History   Socioeconomic History  . Marital status: Married    Spouse name: Timmothy Sours  . Number of children: 3  . Years of education: Not on file  . Highest education level: Not on file  Occupational History  . Occupation: Programmer, multimedia: [employer]  Tobacco Use  . Smoking status: Never Smoker  . Smokeless tobacco: Never Used  Substance and Sexual Activity  . Alcohol use: Yes    Comment: Occasional  . Drug use: No  . Sexual activity: Not on file  Other Topics Concern  . Not on file  Social History Narrative  . Not on file   Social Determinants of Health   Financial Resource Strain:   . Difficulty of Paying Living Expenses: Not on file  Food Insecurity:   . Worried About Charity fundraiser in the Last Year: Not on file  . Ran Out of Food in the Last Year: Not on file  Transportation Needs:   . Lack of Transportation (Medical): Not on file  . Lack of Transportation (Non-Medical): Not on file  Physical Activity:   . Days of Exercise per Week: Not on file  . Minutes of Exercise per Session: Not on file  Stress:   . Feeling of Stress : Not on file  Social Connections:   . Frequency of Communication with Friends and Family: Not on file  . Frequency of Social Gatherings with Friends and Family: Not on file  . Attends Religious Services: Not on file  . Active Member of Clubs or Organizations: Not on file  . Attends Archivist Meetings: Not on file  . Marital Status: Not on file     Family  History: The patient's family history includes Alcoholism in her father and mother; Anxiety disorder in her mother; Bipolar disorder in her mother; COPD in her father; Cancer in her mother; Congestive Heart Failure in her father; Depression in her father and mother; Stroke in her maternal grandfather.  ROS:   Please see the history of present illness.     All other systems reviewed and are negative.  EKGs/Labs/Other Studies Reviewed:    The following studies were reviewed today:  Renal artery [place] 09/18/2018 Renal:   Right: Normal right Resisitive Index. Normal cortical thickness of        right kidney. No evidence of right renal artery  stenosis. RRV        flow present. Partial nephrectomy as a teen. Left:  LRV flow present. No evidence of left renal artery stenosis.        Normal cortical thickness of the left kidney. Normal left        Resistive Index. Normal size of left kidney. Mesenteric: Normal Celiac artery and Superior Mesenteric artery findings.  EKG:  EKG is ordered today.  The ekg ordered today demonstrates normal sinus rhythm, nonspecific ST-T wave changes.  Recent Labs: 01/02/2019: BUN 15; Creatinine, Ser 0.80; Potassium 4.0; Sodium 143  Recent Lipid Panel    Component Value Date/Time   CHOL 217 (H) 08/22/2017 0826   TRIG 96 08/22/2017 0826   HDL 88 08/22/2017 0826   CHOLHDL 2.1 08/09/2016 1108   VLDL 13 07/12/2014 0945   LDLCALC 110 (H) 08/22/2017 0826    Physical Exam:    VS:  BP 125/88   Pulse 78   Ht 5' 5"  (1.651 m)   Wt 207 lb 9.6 oz (94.2 kg)   SpO2 96%   BMI 34.55 kg/m     Wt Readings from Last 3 Encounters:  06/26/19 207 lb 9.6 oz (94.2 kg)  12/15/18 205 lb (93 kg)  09/01/18 208 lb (94.3 kg)     GEN:  Well nourished, well developed in no acute distress HEENT: Normal NECK: No JVD; No carotid bruits LYMPHATICS: No lymphadenopathy CARDIAC: RRR, no murmurs, rubs, gallops RESPIRATORY:  Clear to auscultation without rales, wheezing or rhonchi    ABDOMEN: Soft, non-tender, non-distended MUSCULOSKELETAL:  No edema; No deformity  SKIN: Warm and dry NEUROLOGIC:  Alert and oriented x 3 PSYCHIATRIC:  Normal affect   ASSESSMENT:    1. Palpitations   2. Bicuspid aortic valve   3. Thoracic aortic aneurysm without rupture ([place])    PLAN:    In order of problems listed above:  1. Palpitation: Recent iPhone strip demonstrated sinus rhythm with PACs.  No obvious atrial fibrillation was noted  2. Bicuspid aortic valve: Stable with very mild aortic stenosis on last echocardiogram in March   3. Thoracic aortic aneurysm: Stable on last MRA of the chest.   Medication Adjustments/Labs and Tests Ordered: Current medicines are reviewed at length with the patient today.  Concerns regarding medicines are outlined above.  Orders Placed This Encounter  Procedures  . EKG 12-Lead   No orders of the defined types were placed in this encounter.   Patient Instructions  Medication Instructions:   May take an additional 25 mg of Metoprolol Succinate (toprol XL) as needed for increased palpitations  *If you need a refill on your cardiac medications before your next appointment, please call your pharmacy*  Lab Work:  NONE ordered at this time of appointment   If you have labs (blood work) drawn today and your tests are completely normal, you will receive your results only by: Marland Kitchen MyChart Message (if you have MyChart) OR . A paper copy in the mail If you have any lab test that is abnormal or we need to change your treatment, we will call you to review the results.  Testing/Procedures:  NONE ordered at this time of appointment   Follow-Up: At [facility], you and your health needs are our priority.  As part of our continuing mission to provide you with exceptional heart care, we have created designated Provider Care Teams.  These Care Teams include your primary Cardiologist (physician) and Advanced Practice Providers (APPs -  Physician  Assistants  and Nurse Practitioners) who all work together to provide you with the care you need, when you need it.  Your next appointment:   6 month(s)  The format for your next appointment:   In Person  Provider:   Kirk Ruths, MD  Other Instructions      Signed, Almyra Deforest, [place]  06/28/2019 1:13 AM    [place]

## 2019-06-26 NOTE — Patient Instructions (Signed)
Medication Instructions:   May take an additional 25 mg of Metoprolol Succinate (toprol XL) as needed for increased palpitations  *If you need a refill on your cardiac medications before your next appointment, please call your pharmacy*  Lab Work:  NONE ordered at this time of appointment   If you have labs (blood work) drawn today and your tests are completely normal, you will receive your results only by: Marland Kitchen MyChart Message (if you have MyChart) OR . A paper copy in the mail If you have any lab test that is abnormal or we need to change your treatment, we will call you to review the results.  Testing/Procedures:  NONE ordered at this time of appointment   Follow-Up: At [facility], you and your health needs are our priority.  As part of our continuing mission to provide you with exceptional heart care, we have created designated Provider Care Teams.  These Care Teams include your primary Cardiologist (physician) and Advanced Practice Providers (APPs -  Physician Assistants and Nurse Practitioners) who all work together to provide you with the care you need, when you need it.  Your next appointment:   6 month(s)  The format for your next appointment:   In Person  Provider:   Kirk Ruths, MD  Other Instructions

## 2019-06-28 ENCOUNTER — Encounter: Payer: Self-pay | Admitting: Physician Assistant

## 2019-07-12 ENCOUNTER — Encounter: Payer: Self-pay | Admitting: *Deleted

## 2019-07-13 MED FILL — LOSARTAN POTASSIUM 25 MG TA: 25 | 90 days supply | Qty: 90 | Fill #1

## 2019-07-17 MED FILL — METOPROLOL SUCCINATE ER 50: 50 | 90 days supply | Qty: 180 | Fill #3

## 2019-07-25 ENCOUNTER — Other Ambulatory Visit: Payer: Self-pay

## 2019-07-25 ENCOUNTER — Encounter: Payer: Self-pay | Admitting: Sports Medicine

## 2019-07-25 ENCOUNTER — Ambulatory Visit (INDEPENDENT_AMBULATORY_CARE_PROVIDER_SITE_OTHER): Payer: 59

## 2019-07-25 ENCOUNTER — Ambulatory Visit (INDEPENDENT_AMBULATORY_CARE_PROVIDER_SITE_OTHER): Payer: 59 | Admitting: Sports Medicine

## 2019-07-25 VITALS — BP 104/71 | HR 64 | Ht 65.0 in | Wt 204.0 lb

## 2019-07-25 DIAGNOSIS — R10A1 Flank pain, right side: Secondary | ICD-10-CM | POA: Insufficient documentation

## 2019-07-25 DIAGNOSIS — Z0000 Encounter for general adult medical examination without abnormal findings: Secondary | ICD-10-CM

## 2019-07-25 DIAGNOSIS — B081 Molluscum contagiosum: Secondary | ICD-10-CM | POA: Diagnosis not present

## 2019-07-25 DIAGNOSIS — M47816 Spondylosis without myelopathy or radiculopathy, lumbar region: Secondary | ICD-10-CM | POA: Diagnosis not present

## 2019-07-25 DIAGNOSIS — Z87898 Personal history of other specified conditions: Secondary | ICD-10-CM

## 2019-07-25 DIAGNOSIS — R109 Unspecified abdominal pain: Secondary | ICD-10-CM

## 2019-07-25 DIAGNOSIS — N951 Menopausal and female climacteric states: Secondary | ICD-10-CM | POA: Diagnosis not present

## 2019-07-25 DIAGNOSIS — Z1211 Encounter for screening for malignant neoplasm of colon: Secondary | ICD-10-CM

## 2019-07-25 HISTORY — DX: Unspecified abdominal pain: R10.9

## 2019-07-25 MED ORDER — NEBIVOLOL HCL 10 MG PO TABS: 10.0000 mg | ORAL_TABLET | Freq: Every day | ORAL | 3 refills | Status: DC

## 2019-07-25 MED FILL — BYSTOLIC 10 MG TABLET: 10 | 30 days supply | Qty: 30 | Fill #0

## 2019-07-25 NOTE — Assessment & Plan Note (Signed)
Annual physical as above, ordering routine labs. She is due for Cologuard, ordering this now. She is status post breast cancer, mastectomy with TRAM flap reconstruction, no further breast imaging will be ever needed.

## 2019-07-25 NOTE — Assessment & Plan Note (Signed)
Cryotherapy of skin lesion on the right lower leg. Return as needed for this.

## 2019-07-25 NOTE — Assessment & Plan Note (Addendum)
Continues to have palpitations, on Holter monitor it looks like there were PACs, PVCs and a 7 beat run of V. tach. Echoes have been normal. She is feeling little bit sluggish on metoprolol 50 mg daily. I have suggested switching to Bystolic, I would like to discuss this with her cardiologist first.  After discussion with Dr. Stanford Breed we will be switching to Bystolic, starting at 10 mg with upward titration to 40 mg if needed. We can uptitrate every 2 weeks. I have already spoken to the patient and she will go pick it up. She has had intolerances to metoprolol, atenolol, carvedilol.

## 2019-07-25 NOTE — Progress Notes (Addendum)
Subjective:    CC: Annual Physical Exam  HPI:  This patient is here for their annual physical, she has several complaints.  I reviewed the past medical history, family history, social history, surgical history, and allergies today and no changes were needed.  Please see the problem list section below in epic for further details.  Past Medical History: Past Medical History:  Diagnosis Date  . Anxiety   . Back pain   . Bicuspid aortic valve   . Breast cancer ([place])   . Depression   . Joint pain   . Kidney cyst, acquired   . Obesity   . Palpitations   . Polycythemia vera ([place])   . Thoracic aortic aneurysm ([place])   . Thyroid nodule    Past Surgical History: Past Surgical History:  Procedure Laterality Date  . DILITATION & CURRETTAGE/HYSTROSCOPY WITH THERMACHOICE ABLATION N/A 12/28/2012   Procedure: DILATATION & CURETTAGE/HYSTEROSCOPY WITH THERMACHOICE ABLATION;  Surgeon: Cyril Mourning, MD;  Location: [facility];  Service: Gynecology;  Laterality: N/A;  . MASTECTOMY     bilateral  . PARTIAL NEPHRECTOMY     RT   . TONSILLECTOMY    . TUBAL LIGATION     Social History: Social History   Socioeconomic History  . Marital status: Married    Spouse name: Timmothy Sours  . Number of children: 3  . Years of education: Not on file  . Highest education level: Not on file  Occupational History  . Occupation: Programmer, multimedia: [employer]  Tobacco Use  . Smoking status: Never Smoker  . Smokeless tobacco: Never Used  Substance and Sexual Activity  . Alcohol use: Yes    Comment: Occasional  . Drug use: No  . Sexual activity: Not on file  Other Topics Concern  . Not on file  Social History Narrative  . Not on file   Social Determinants of Health   Financial Resource Strain:   . Difficulty of Paying Living Expenses: Not on file  Food Insecurity:   . Worried About Charity fundraiser in the Last Year: Not on file  . Ran Out of Food in the Last Year: Not on file  Transportation Needs:     . Lack of Transportation (Medical): Not on file  . Lack of Transportation (Non-Medical): Not on file  Physical Activity:   . Days of Exercise per Week: Not on file  . Minutes of Exercise per Session: Not on file  Stress:   . Feeling of Stress : Not on file  Social Connections:   . Frequency of Communication with Friends and Family: Not on file  . Frequency of Social Gatherings with Friends and Family: Not on file  . Attends Religious Services: Not on file  . Active Member of Clubs or Organizations: Not on file  . Attends Archivist Meetings: Not on file  . Marital Status: Not on file   Family History: Family History  Problem Relation Age of Onset  . Cancer Mother        lung  . Depression Mother   . Anxiety disorder Mother   . Bipolar disorder Mother   . Alcoholism Mother   . COPD Father   . Congestive Heart Failure Father   . Depression Father   . Alcoholism Father   . Stroke Maternal Grandfather    Allergies: No Known Allergies Medications: See med rec.  Review of Systems: No headache, visual changes, nausea, vomiting, diarrhea, constipation, dizziness, abdominal pain, skin  rash, fevers, chills, night sweats, swollen lymph nodes, weight loss, chest pain, body aches, joint swelling, muscle aches, shortness of breath, mood changes, visual or auditory hallucinations.  Objective:    General: Well Developed, well nourished, and in no acute distress.  Neuro: Alert and oriented x3, extra-ocular muscles intact, sensation grossly intact. Cranial nerves II through XII are intact, motor, sensory, and coordinative functions are all intact. HEENT: Normocephalic, atraumatic, pupils equal round reactive to light, neck supple, no masses, no lymphadenopathy, thyroid nonpalpable. Oropharynx, nasopharynx, external ear canals are unremarkable. Skin: Warm and dry, no rashes noted.  Cardiac: Regular rate and rhythm, no murmurs rubs or gallops.  Respiratory: Clear to auscultation  bilaterally. Not using accessory muscles, speaking in full sentences.  Abdominal: Soft, nontender, nondistended, positive bowel sounds, no masses, no organomegaly.  Musculoskeletal: Shoulder, elbow, wrist, hip, knee, ankle stable, and with full range of motion.  Procedure:  Cryodestruction of right lower leg skin lesion Consent obtained and verified. Time-out conducted. Noted no overlying erythema, induration, or other signs of local infection. Completed without difficulty using Cryo-Gun. Advised to call if fevers/chills, erythema, induration, drainage, or persistent bleeding.  Impression and Recommendations:    The patient was counselled, risk factors were discussed, anticipatory guidance given.  Annual physical exam Annual physical as above, ordering routine labs. She is due for Cologuard, ordering this now. She is status post breast cancer, mastectomy with TRAM flap reconstruction, no further breast imaging will be ever needed.  History of palpitations Continues to have palpitations, on Holter monitor it looks like there were PACs, PVCs and a 7 beat run of V. tach. Echoes have been normal. She is feeling little bit sluggish on metoprolol 50 mg daily. I have suggested switching to Bystolic, I would like to discuss this with her cardiologist first.  After discussion with Dr. Stanford Breed we will be switching to Bystolic, starting at 10 mg with upward titration to 40 mg if needed. We can uptitrate every 2 weeks. I have already spoken to the patient and she will go pick it up. She has had intolerances to metoprolol, atenolol, carvedilol.  Molluscum contagiosum Cryotherapy of skin lesion on the right lower leg. Return as needed for this.  Perimenopause Per patient request we are going to check hormonal labs, FSH, LH, estrogen, progesterone.   Right flank pain Worse after working out, present for a couple weeks and getting better with ibuprofen. Adding lumbar spine x-rays,  urinalysis to ensure no renal component. Rehab exercises. Return to see me as needed for this.   ___________________________________________ [name]. Dianah Field, M.D., ABFM., CAQSM. Primary Care and Sports Medicine [facility]  Adjunct Professor of [place] [employer]

## 2019-07-25 NOTE — Assessment & Plan Note (Signed)
Per patient request we are going to check hormonal labs, FSH, LH, estrogen, progesterone.

## 2019-07-25 NOTE — Assessment & Plan Note (Signed)
Worse after working out, present for a couple weeks and getting better with ibuprofen. Adding lumbar spine x-rays, urinalysis to ensure no renal component. Rehab exercises. Return to see me as needed for this.

## 2019-07-25 NOTE — Addendum Note (Signed)
Addended by: Silverio Decamp on: 07/25/2019 10:19 AM   Modules accepted: Orders

## 2019-07-26 LAB — URINALYSIS W MICROSCOPIC + REFLEX CULTURE

## 2019-07-26 LAB — NO CULTURE INDICATED

## 2019-07-26 MED ORDER — VITAMIN D (ERGOCALCIFEROL) 1.25 MG (50000 UNIT) PO CAPS: 50000.0000 [IU] | ORAL_CAPSULE | ORAL | 0 refills | Status: DC

## 2019-07-26 MED FILL — VIT D2 1.25 MG (50,000 UNIT: 1.25 MG | 56 days supply | Qty: 8 | Fill #0

## 2019-07-26 NOTE — Addendum Note (Signed)
Addended by: Silverio Decamp on: 07/26/2019 08:21 AM   Modules accepted: Orders

## 2019-07-29 LAB — CBC
HCT: 39.4 % (ref 35.0–45.0)
Hemoglobin: 13.1 g/dL (ref 11.7–15.5)
MCH: 28.2 pg (ref 27.0–33.0)
MCHC: 33.2 g/dL (ref 32.0–36.0)
MCV: 84.9 fL (ref 80.0–100.0)
MPV: 10.2 fL (ref 7.5–12.5)
Platelets: 167 10*3/uL (ref 140–400)
RBC: 4.64 10*6/uL (ref 3.80–5.10)
RDW: 12.8 % (ref 11.0–15.0)
WBC: 6 10*3/uL (ref 3.8–10.8)

## 2019-07-29 LAB — LIPID PANEL W/REFLEX DIRECT LDL
Cholesterol: 205 mg/dL — ABNORMAL HIGH (ref <200)
HDL: 73 mg/dL (ref >=50)
LDL Cholesterol (Calc): 110 mg/dL (calc) — ABNORMAL HIGH
Non-HDL Cholesterol (Calc): 132 mg/dL (calc) — ABNORMAL HIGH (ref <130)
Total CHOL/HDL Ratio: 2.8 (calc) (ref <5.0)
Triglycerides: 114 mg/dL (ref <150)

## 2019-07-29 LAB — COMPLETE METABOLIC PANEL WITH GFR
AG Ratio: 1.8 (calc) (ref 1.0–2.5)
ALT: 19 U/L (ref 6–29)
AST: 15 U/L (ref 10–35)
Albumin: 4.2 g/dL (ref 3.6–5.1)
Alkaline phosphatase (APISO): 67 U/L (ref 37–153)
BUN: 10 mg/dL (ref 7–25)
CO2: 29 mmol/L (ref 20–32)
Calcium: 9.5 mg/dL (ref 8.6–10.4)
Chloride: 103 mmol/L (ref 98–110)
Creat: 0.72 mg/dL (ref 0.50–1.05)
GFR, Est African American: 113 mL/min/{1.73_m2} (ref >=60)
GFR, Est Non African American: 98 mL/min/{1.73_m2} (ref >=60)
Globulin: 2.3 g/dL (calc) (ref 1.9–3.7)
Glucose, Bld: 98 mg/dL (ref 65–99)
Potassium: 4 mmol/L (ref 3.5–5.3)
Sodium: 141 mmol/L (ref 135–146)
Total Bilirubin: 0.4 mg/dL (ref 0.2–1.2)
Total Protein: 6.5 g/dL (ref 6.1–8.1)

## 2019-07-29 LAB — LUTEINIZING HORMONE: LH: 45.1 m[IU]/mL

## 2019-07-29 LAB — FOLLICLE STIMULATING HORMONE: FSH: 97.4 m[IU]/mL

## 2019-07-29 LAB — VITAMIN D 25 HYDROXY (VIT D DEFICIENCY, FRACTURES): Vit D, 25-Hydroxy: 26 ng/mL — ABNORMAL LOW (ref 30–100)

## 2019-07-29 LAB — PROGESTERONE: Progesterone: <0.5 ng/mL

## 2019-07-29 LAB — TSH: TSH: 0.96 mIU/L

## 2019-07-29 LAB — ESTROGENS, TOTAL: Estrogen: 208.8 pg/mL

## 2019-08-15 DIAGNOSIS — Z9889 Other specified postprocedural states: Secondary | ICD-10-CM | POA: Diagnosis not present

## 2019-08-15 DIAGNOSIS — R109 Unspecified abdominal pain: Secondary | ICD-10-CM | POA: Diagnosis not present

## 2019-08-21 MED FILL — BYSTOLIC 10 MG TABLET: 10 | 90 days supply | Qty: 90 | Fill #1

## 2019-08-22 NOTE — Telephone Encounter (Signed)
Pt sent provider a MyChart msg regarding bystolic rx.

## 2019-08-27 DIAGNOSIS — K439 Ventral hernia without obstruction or gangrene: Secondary | ICD-10-CM | POA: Diagnosis not present

## 2019-09-05 DIAGNOSIS — N281 Cyst of kidney, acquired: Secondary | ICD-10-CM | POA: Diagnosis not present

## 2019-09-05 DIAGNOSIS — R109 Unspecified abdominal pain: Secondary | ICD-10-CM | POA: Diagnosis not present

## 2019-09-05 DIAGNOSIS — K5730 Diverticulosis of large intestine without perforation or abscess without bleeding: Secondary | ICD-10-CM | POA: Diagnosis not present

## 2019-09-05 DIAGNOSIS — K439 Ventral hernia without obstruction or gangrene: Secondary | ICD-10-CM | POA: Diagnosis not present

## 2019-09-05 DIAGNOSIS — K449 Diaphragmatic hernia without obstruction or gangrene: Secondary | ICD-10-CM | POA: Diagnosis not present

## 2019-09-14 ENCOUNTER — Other Ambulatory Visit: Payer: Self-pay | Admitting: Cardiology

## 2019-09-14 DIAGNOSIS — I10 Essential (primary) hypertension: Secondary | ICD-10-CM

## 2019-09-14 MED FILL — HYDROCHLOROTHIAZIDE 12.5 MG: 12.5 | 30 days supply | Qty: 30 | Fill #0

## 2019-09-17 ENCOUNTER — Encounter: Payer: Self-pay | Admitting: Sports Medicine

## 2019-09-17 DIAGNOSIS — Z0000 Encounter for general adult medical examination without abnormal findings: Secondary | ICD-10-CM | POA: Diagnosis not present

## 2019-09-20 DIAGNOSIS — K439 Ventral hernia without obstruction or gangrene: Secondary | ICD-10-CM

## 2019-09-20 HISTORY — DX: Ventral hernia without obstruction or gangrene: K43.9

## 2019-09-22 LAB — COLOGUARD: Cologuard: NEGATIVE

## 2019-09-24 MED FILL — LOSARTAN POTASSIUM 25 MG TA: 25 | 90 days supply | Qty: 90 | Fill #2

## 2019-10-22 ENCOUNTER — Other Ambulatory Visit: Payer: Self-pay | Admitting: Sports Medicine

## 2019-10-22 MED FILL — HYDROCHLOROTHIAZIDE 12.5 MG: 12.5 | 30 days supply | Qty: 30 | Fill #1

## 2019-10-31 DIAGNOSIS — K439 Ventral hernia without obstruction or gangrene: Secondary | ICD-10-CM | POA: Diagnosis not present

## 2019-10-31 DIAGNOSIS — I10 Essential (primary) hypertension: Secondary | ICD-10-CM | POA: Diagnosis not present

## 2019-10-31 DIAGNOSIS — Z6833 Body mass index (BMI) 33.0-33.9, adult: Secondary | ICD-10-CM | POA: Diagnosis not present

## 2019-10-31 DIAGNOSIS — Z853 Personal history of malignant neoplasm of breast: Secondary | ICD-10-CM | POA: Diagnosis not present

## 2019-10-31 DIAGNOSIS — E669 Obesity, unspecified: Secondary | ICD-10-CM | POA: Diagnosis not present

## 2019-11-01 DIAGNOSIS — K439 Ventral hernia without obstruction or gangrene: Secondary | ICD-10-CM | POA: Diagnosis not present

## 2019-11-01 DIAGNOSIS — Z6833 Body mass index (BMI) 33.0-33.9, adult: Secondary | ICD-10-CM | POA: Diagnosis not present

## 2019-11-01 DIAGNOSIS — Z853 Personal history of malignant neoplasm of breast: Secondary | ICD-10-CM | POA: Diagnosis not present

## 2019-11-01 DIAGNOSIS — I10 Essential (primary) hypertension: Secondary | ICD-10-CM | POA: Diagnosis not present

## 2019-11-01 DIAGNOSIS — E669 Obesity, unspecified: Secondary | ICD-10-CM | POA: Diagnosis not present

## 2019-11-02 DIAGNOSIS — Z853 Personal history of malignant neoplasm of breast: Secondary | ICD-10-CM | POA: Diagnosis not present

## 2019-11-02 DIAGNOSIS — K439 Ventral hernia without obstruction or gangrene: Secondary | ICD-10-CM | POA: Diagnosis not present

## 2019-11-02 DIAGNOSIS — Z6833 Body mass index (BMI) 33.0-33.9, adult: Secondary | ICD-10-CM | POA: Diagnosis not present

## 2019-11-02 DIAGNOSIS — E669 Obesity, unspecified: Secondary | ICD-10-CM | POA: Diagnosis not present

## 2019-11-02 DIAGNOSIS — I10 Essential (primary) hypertension: Secondary | ICD-10-CM | POA: Diagnosis not present

## 2019-11-12 ENCOUNTER — Other Ambulatory Visit: Payer: Self-pay | Admitting: Sports Medicine

## 2019-11-12 DIAGNOSIS — Z87898 Personal history of other specified conditions: Secondary | ICD-10-CM

## 2019-11-12 MED FILL — BYSTOLIC 10 MG TABLET: 10 | 90 days supply | Qty: 90 | Fill #0

## 2019-11-28 ENCOUNTER — Other Ambulatory Visit: Payer: Self-pay | Admitting: Physician Assistant

## 2019-11-28 DIAGNOSIS — I10 Essential (primary) hypertension: Secondary | ICD-10-CM

## 2019-11-29 MED FILL — HYDROCHLOROTHIAZIDE 12.5 MG: 12.5 | 30 days supply | Qty: 30 | Fill #0

## 2019-12-26 ENCOUNTER — Telehealth (INDEPENDENT_AMBULATORY_CARE_PROVIDER_SITE_OTHER): Payer: 59 | Admitting: Cardiology

## 2019-12-26 ENCOUNTER — Encounter: Payer: Self-pay | Admitting: Cardiology

## 2019-12-26 VITALS — BP 121/71 | HR 76 | Ht 65.0 in | Wt 196.1 lb

## 2019-12-26 DIAGNOSIS — I712 Thoracic aortic aneurysm, without rupture, unspecified: Secondary | ICD-10-CM

## 2019-12-26 DIAGNOSIS — I10 Essential (primary) hypertension: Secondary | ICD-10-CM | POA: Diagnosis not present

## 2019-12-26 NOTE — Patient Instructions (Signed)
Medication Instructions:  Your physician recommends that you continue on your current medications as directed. Please refer to the Current Medication list given to you today.  *If you need a refill on your cardiac medications before your next appointment, please call your pharmacy*   Follow-Up: At [facility], you and your health needs are our priority.  As part of our continuing mission to provide you with exceptional heart care, we have created designated Provider Care Teams.  These Care Teams include your primary Cardiologist (physician) and Advanced Practice Providers (APPs -  Physician Assistants and Nurse Practitioners) who all work together to provide you with the care you need, when you need it.  We recommend signing up for the patient portal called "MyChart".  Sign up information is provided on this After Visit Summary.  MyChart is used to connect with patients for Virtual Visits (Telemedicine).  Patients are able to view lab/test results, encounter notes, upcoming appointments, etc.  Non-urgent messages can be sent to your provider as well.   To learn more about what you can do with MyChart, go to [email or web address].    Your next appointment:   March 2022  The format for your next appointment:   In Person  Provider:   You may see Kirk Ruths, MD or one of the following Advanced Practice Providers on your designated Care Team:    Kerin Ransom, PA-C  [place], [place]  Coletta Memos, [place]    Other Instructions Please call our office 2 months in advance to schedule your follow-up appointment with Dr. Stanford Breed.

## 2019-12-26 NOTE — Progress Notes (Signed)
Virtual Visit via Telephone Note   This visit type was conducted due to national recommendations for restrictions regarding the COVID-19 Pandemic (e.g. social distancing) in an effort to limit this patient's exposure and mitigate transmission in our community.  Due to her co-morbid illnesses, this patient is at least at moderate risk for complications without adequate follow up.  This format is felt to be most appropriate for this patient at this time.  The patient did not have access to video technology/had technical difficulties with video requiring transitioning to audio format only (telephone).  All issues noted in this document were discussed and addressed.  No physical exam could be performed with this format.  Please refer to the patient's chart for her  consent to telehealth for [facility].   The patient was identified using 2 identifiers.  Date:  12/26/2019   ID:  Melissa Atkinson, DOB [birth year], MRN [id number]  Patient Location: Home Provider Location: Home  PCP:  Silverio Decamp, MD  Cardiologist:  Kirk Ruths, MD  Electrophysiologist:  None   Evaluation Performed:  Follow-Up Visit  Chief Complaint:  none  History of Present Illness:    Melissa Atkinson is a 49 y.o. female with history of bicuspid aortic valve and mild aortic stenosis by echocardiogram March 2020.  She also has a history of palpitations and hypertension.  In December 2020 she reported an episode of tachycardia at night that concerned her.  Her husband had an apple watch and they did a tracing.  This was reviewed and felt to be sinus tachycardia with PACs.  Her metoprolol was increased then.  Later this year her PCP changed her Toprol to Bystolic.  The patient says she is done very well on the Bystolic and has had no further palpitations.  She tells me she has lost weight since December.  Her blood pressures controlled, 121/71.  Overall she feels like she is doing "great".  The patient does not have  symptoms concerning for COVID-19 infection (fever, chills, cough, or new shortness of breath).    Past Medical History:  Diagnosis Date  . Anxiety   . Back pain   . Bicuspid aortic valve   . Breast cancer ([place])   . Depression   . Joint pain   . Kidney cyst, acquired   . Obesity   . Palpitations   . Polycythemia vera ([place])   . Thoracic aortic aneurysm ([place])   . Thyroid nodule    Past Surgical History:  Procedure Laterality Date  . DILITATION & CURRETTAGE/HYSTROSCOPY WITH THERMACHOICE ABLATION N/A 12/28/2012   Procedure: DILATATION & CURETTAGE/HYSTEROSCOPY WITH THERMACHOICE ABLATION;  Surgeon: Cyril Mourning, MD;  Location: [facility];  Service: Gynecology;  Laterality: N/A;  . MASTECTOMY     bilateral  . PARTIAL NEPHRECTOMY     RT   . TONSILLECTOMY    . TUBAL LIGATION       Current Meds  Medication Sig  . BYSTOLIC 10 MG tablet TAKE 1 TABLET (10 MG TOTAL) BY MOUTH DAILY.  . hydrochlorothiazide (MICROZIDE) 12.5 MG capsule Take 1 capsule (12.5 mg total) by mouth daily. Schedule ov for further refills.  . triamcinolone cream (KENALOG) 0.1 % APPLY TOPICALLY 2 TIMES DAILY.     Allergies:   Patient has no known allergies.   Social History   Tobacco Use  . Smoking status: Never Smoker  . Smokeless tobacco: Never Used  Vaping Use  . Vaping Use: Never used  Substance Use Topics  .  Alcohol use: Yes    Comment: Occasional  . Drug use: No     Family Hx: The patient's family history includes Alcoholism in her father and mother; Anxiety disorder in her mother; Bipolar disorder in her mother; COPD in her father; Cancer in her mother; Congestive Heart Failure in her father; Depression in her father and mother; Stroke in her maternal grandfather.  ROS:   Please see the history of present illness.    All other systems reviewed and are negative.   Prior CV studies:   The following studies were reviewed today: Echo 09/08/2018- IMPRESSIONS    1. The left ventricle has normal  systolic function with an ejection  fraction of 60-65%. The cavity size was normal. Left ventricular diastolic  parameters were normal. No evidence of left ventricular regional wall  motion abnormalities.  2. The right ventricle has normal systolic function. The cavity was  normal. There is no increase in right ventricular wall thickness. Right  ventricular systolic pressure could not be assessed.  3. The aortic valve has an indeterminate number of cusps mild stenosis of  the aortic valve.  4. There is mild dilatation of the ascending aorta measuring 36 mm.  5. When compared to the prior study: Prior echo reported aortic valve as  bicuspid, though it is not well visualized on this study. Aortic gradient  mean 8 mmHg, similar to prior. No significant aortic valve regurgitation.  Ascending aorta measures just  over 3.6 cm.   Chest MRI MRA 09/08/2018- IMPRESSION: VASCULAR  Stable mild dilation of the tubular portion of the thoracic aorta with a maximal diameter of 3.8 cm.   Labs/Other Tests and Data Reviewed:    EKG:  An ECG dated 06/26/2019 was personally reviewed today and demonstrated:  NSR- HR 78  Recent Labs: 07/25/2019: ALT 19; BUN 10; Creat 0.72; Hemoglobin 13.1; Platelets 167; Potassium 4.0; Sodium 141; TSH 0.96   Recent Lipid Panel Lab Results  Component Value Date/Time   CHOL 205 (H) 07/25/2019 09:47 AM   CHOL 217 (H) 08/22/2017 08:26 AM   TRIG 114 07/25/2019 09:47 AM   HDL 73 07/25/2019 09:47 AM   HDL 88 08/22/2017 08:26 AM   CHOLHDL 2.8 07/25/2019 09:47 AM   LDLCALC 110 (H) 07/25/2019 09:47 AM    Wt Readings from Last 3 Encounters:  12/26/19 196 lb 1.6 oz (89 kg)  07/25/19 204 lb (92.5 kg)  06/26/19 207 lb 9.6 oz (94.2 kg)     Objective:    Vital Signs:  BP 121/71   Pulse 76   Ht 5' 5"  (1.651 m)   Wt 196 lb 1.6 oz (89 kg)   BMI 32.63 kg/m    VITAL SIGNS:  reviewed  ASSESSMENT & PLAN:    Palpitations- No documented PAF- symptoms improved with  change from Toprol to Bystolic.  HTN- Controlled  Bicuspid AOV- No symptoms.  Last echo March 2020  Thoracic aorta enlargement- 3.8 cm by MRI MRA March 2020  Plan: Same Rx.  F/U Dr Olegario Atkinson March 2022- she will be due for repeat MRI MRA then and may need a f/u echo as well.   COVID-19 Education: The signs and symptoms of COVID-19 were discussed with the patient and how to seek care for testing (follow up with PCP or arrange E-visit).  The importance of social distancing was discussed today.  Time:   Today, I have spent 15 minutes with the patient with telehealth technology discussing the above problems.     Medication  Adjustments/Labs and Tests Ordered: Current medicines are reviewed at length with the patient today.  Concerns regarding medicines are outlined above.   Tests Ordered: No orders of the defined types were placed in this encounter.   Medication Changes: No orders of the defined types were placed in this encounter.   Follow Up:  In Person Dr Stanford Breed March 2022  Signed, Kerin Ransom, Hershal Coria  12/26/2019 9:57 AM    [place]

## 2020-01-03 ENCOUNTER — Other Ambulatory Visit: Payer: Self-pay | Admitting: Cardiology

## 2020-01-03 DIAGNOSIS — I10 Essential (primary) hypertension: Secondary | ICD-10-CM

## 2020-01-06 DIAGNOSIS — I10 Essential (primary) hypertension: Secondary | ICD-10-CM

## 2020-01-07 ENCOUNTER — Other Ambulatory Visit: Payer: Self-pay | Admitting: Cardiology

## 2020-01-07 MED ORDER — HYDROCHLOROTHIAZIDE 12.5 MG PO CAPS: 12.5000 mg | ORAL_CAPSULE | Freq: Every day | ORAL | 3 refills | Status: DC

## 2020-01-07 MED FILL — HYDROCHLOROTHIAZIDE 12.5 MG: 12.5 | 90 days supply | Qty: 90 | Fill #0

## 2020-01-23 DIAGNOSIS — M6281 Muscle weakness (generalized): Secondary | ICD-10-CM | POA: Diagnosis not present

## 2020-01-23 DIAGNOSIS — Z9889 Other specified postprocedural states: Secondary | ICD-10-CM | POA: Diagnosis not present

## 2020-01-27 DIAGNOSIS — K8020 Calculus of gallbladder without cholecystitis without obstruction: Secondary | ICD-10-CM

## 2020-01-27 HISTORY — DX: Calculus of gallbladder without cholecystitis without obstruction: K80.20

## 2020-01-30 DIAGNOSIS — R29898 Other symptoms and signs involving the musculoskeletal system: Secondary | ICD-10-CM | POA: Diagnosis not present

## 2020-01-30 DIAGNOSIS — M6281 Muscle weakness (generalized): Secondary | ICD-10-CM | POA: Diagnosis not present

## 2020-01-30 DIAGNOSIS — Z9889 Other specified postprocedural states: Secondary | ICD-10-CM | POA: Diagnosis not present

## 2020-01-30 DIAGNOSIS — Z9013 Acquired absence of bilateral breasts and nipples: Secondary | ICD-10-CM | POA: Diagnosis not present

## 2020-02-04 ENCOUNTER — Encounter: Payer: Self-pay | Admitting: Nurse Practitioner

## 2020-02-04 ENCOUNTER — Ambulatory Visit (INDEPENDENT_AMBULATORY_CARE_PROVIDER_SITE_OTHER): Payer: 59 | Admitting: Nurse Practitioner

## 2020-02-04 ENCOUNTER — Other Ambulatory Visit: Payer: Self-pay

## 2020-02-04 ENCOUNTER — Other Ambulatory Visit: Payer: Self-pay | Admitting: Nurse Practitioner

## 2020-02-04 VITALS — BP 121/83 | HR 63 | Temp 97.9°F | Ht 65.0 in | Wt 191.7 lb

## 2020-02-04 DIAGNOSIS — T300 Burn of unspecified body region, unspecified degree: Secondary | ICD-10-CM | POA: Diagnosis not present

## 2020-02-04 DIAGNOSIS — L259 Unspecified contact dermatitis, unspecified cause: Secondary | ICD-10-CM | POA: Diagnosis not present

## 2020-02-04 MED ORDER — PREDNISONE 20 MG PO TABS: 20.0000 mg | ORAL_TABLET | Freq: Every day | ORAL | 1 refills | Status: DC

## 2020-02-04 MED ORDER — SILVER SULFADIAZINE 1 % EX CREA: 1.0000 "application " | TOPICAL_CREAM | Freq: Every day | CUTANEOUS | 1 refills | Status: DC

## 2020-02-04 MED ORDER — HYDROXYZINE HCL 50 MG PO TABS: 50.0000 mg | ORAL_TABLET | Freq: Every evening | ORAL | 3 refills | Status: DC | PRN

## 2020-02-04 MED FILL — SSD 1% CREAM: 1 | 14 days supply | Qty: 50 | Fill #0

## 2020-02-04 MED FILL — predniSONE 20 MG TABS: 20 | 5 days supply | Qty: 5 | Fill #0

## 2020-02-04 MED FILL — hydrOXYzine HCL 50 MG TABS: 50 | 30 days supply | Qty: 60 | Fill #0

## 2020-02-04 NOTE — Progress Notes (Signed)
Acute Office Visit  Subjective:    Patient ID: LYNDSEY DEMOS, female    DOB: 08/18/1970, 49 y.o.   MRN: [id number]  Chief Complaint  Patient presents with  . Rash    has been using Thrive transdermal patches for the last 2 months and noticed last week that she started having a reaction to the patches, has welts and hives all over, has not worn a patch in over a week but rash is still present    HPI Patient is in today for rash that has developed in various locations on her abdomen and back after using transdermal health supplement for the past several months. She reports that she has been using Le-vel Thrive as a health supplement for 2 months. A component of the regimen is a transdermal patch with various oils and vitamins. She reports that she had no problem the first month and a half with skin irritation or redness.   She began using a new formulation of patch approximately 2 weeks ago and states that last Wednesday she noticed redness around the perimeter of the patched area when she removed the patch from her abdomen. She did not place another patch after that. Since that time all of the areas where patches were placed over the past 2 weeks have become extremely red, irritated, and pruritic. She reports none of the redness was present prior to the placement of the last patch on Wednesday.    She reports she originally thought this to be an allergic type contact dermatitis and started using triamcinolone cream on the areas and taking benadryl at night. She reports the areas have only increased in redness and the intensity of the pruritis is growing. She states that the areas feel like burns as opposed to dermatitis at this time.   She endorses difficulty sleeping, pruritis, and pain to the areas of redness.   She denies fever, chills, shortness of breath, chest pain, or edema. She has no known allergies.   Past Medical History:  Diagnosis Date  . Anxiety   . Back pain   . Bicuspid  aortic valve   . Breast cancer ([place])   . Depression   . Joint pain   . Kidney cyst, acquired   . Obesity   . Palpitations   . Polycythemia vera ([place])   . Thoracic aortic aneurysm ([place])   . Thyroid nodule     Past Surgical History:  Procedure Laterality Date  . DILITATION & CURRETTAGE/HYSTROSCOPY WITH THERMACHOICE ABLATION N/A 12/28/2012   Procedure: DILATATION & CURETTAGE/HYSTEROSCOPY WITH THERMACHOICE ABLATION;  Surgeon: Cyril Mourning, MD;  Location: [facility];  Service: Gynecology;  Laterality: N/A;  . MASTECTOMY     bilateral  . PARTIAL NEPHRECTOMY     RT   . TONSILLECTOMY    . TUBAL LIGATION      Family History  Problem Relation Age of Onset  . Cancer Mother        lung  . Depression Mother   . Anxiety disorder Mother   . Bipolar disorder Mother   . Alcoholism Mother   . COPD Father   . Congestive Heart Failure Father   . Depression Father   . Alcoholism Father   . Stroke Maternal Grandfather     Social History   Socioeconomic History  . Marital status: Married    Spouse name: Timmothy Sours  . Number of children: 3  . Years of education: Not on file  . Highest education level: Not on  file  Occupational History  . Occupation: Programmer, multimedia: [employer]  Tobacco Use  . Smoking status: Never Smoker  . Smokeless tobacco: Never Used  Vaping Use  . Vaping Use: Never used  Substance and Sexual Activity  . Alcohol use: Yes    Comment: Occasional  . Drug use: No  . Sexual activity: Not on file  Other Topics Concern  . Not on file  Social History Narrative  . Not on file   Social Determinants of Health   Financial Resource Strain:   . Difficulty of Paying Living Expenses:   Food Insecurity:   . Worried About Charity fundraiser in the Last Year:   . Arboriculturist in the Last Year:   Transportation Needs:   . Film/video editor (Medical):   Marland Kitchen Lack of Transportation (Non-Medical):   Physical Activity:   . Days of Exercise per Week:   . Minutes of  Exercise per Session:   Stress:   . Feeling of Stress :   Social Connections:   . Frequency of Communication with Friends and Family:   . Frequency of Social Gatherings with Friends and Family:   . Attends Religious Services:   . Active Member of Clubs or Organizations:   . Attends Archivist Meetings:   Marland Kitchen Marital Status:   Intimate Partner Violence:   . Fear of Current or Ex-Partner:   . Emotionally Abused:   Marland Kitchen Physically Abused:   . Sexually Abused:     Outpatient Medications Prior to Visit  Medication Sig Dispense Refill  . aspirin 81 MG tablet Take 81 mg by mouth daily.     Marland Kitchen BYSTOLIC 10 MG tablet TAKE 1 TABLET (10 MG TOTAL) BY MOUTH DAILY. 90 tablet 3  . hydrochlorothiazide (MICROZIDE) 12.5 MG capsule Take 1 capsule (12.5 mg total) by mouth daily. 90 capsule 3  . losartan (COZAAR) 25 MG tablet Take 1 tablet (25 mg total) by mouth daily. 90 tablet 3  . triamcinolone cream (KENALOG) 0.1 % APPLY TOPICALLY 2 TIMES DAILY. 454 g 11  . LORazepam (ATIVAN) 0.5 MG tablet Take 0.5 mg by mouth every 6 (six) hours as needed. (Patient not taking: Reported on 02/04/2020)    . oxyCODONE (OXY IR/ROXICODONE) 5 MG immediate release tablet  (Patient not taking: Reported on 02/04/2020)     No facility-administered medications prior to visit.    No Known Allergies      Objective:    Physical Exam Vitals and nursing note reviewed.  Constitutional:      Appearance: Normal appearance.  HENT:     Head: Normocephalic.  Eyes:     Extraocular Movements: Extraocular movements intact.     Conjunctiva/sclera: Conjunctivae normal.     Pupils: Pupils are equal, round, and reactive to light.  Cardiovascular:     Rate and Rhythm: Normal rate.  Pulmonary:     Effort: Pulmonary effort is normal.  Abdominal:     General: Abdomen is flat.     Palpations: Abdomen is soft.  Musculoskeletal:        General: Normal range of motion.  Skin:    General: Skin is warm and dry.     Capillary  Refill: Capillary refill takes less than 2 seconds.     Findings: Rash present.       Neurological:     General: No focal deficit present.     Mental Status: She is alert and oriented to person, place,  and time.  Psychiatric:        Mood and Affect: Mood normal.        Behavior: Behavior normal.        Thought Content: Thought content normal.        Judgment: Judgment normal.     BP 121/83   Pulse 63   Temp 97.9 F (36.6 C) (Oral)   Ht 5' 5"  (1.651 m)   Wt 191 lb 11.2 oz (87 kg)   SpO2 97%   BMI 31.90 kg/m  Wt Readings from Last 3 Encounters:  02/04/20 191 lb 11.2 oz (87 kg)  12/26/19 196 lb 1.6 oz (89 kg)  07/25/19 204 lb (92.5 kg)    Health Maintenance Due  Topic Date Due  . PAP SMEAR-Modifier  01/11/2020  . INFLUENZA VACCINE  01/27/2020    There are no preventive care reminders to display for this patient.   Lab Results  Component Value Date   TSH 0.96 07/25/2019   Lab Results  Component Value Date   WBC 6.0 07/25/2019   HGB 13.1 07/25/2019   HCT 39.4 07/25/2019   MCV 84.9 07/25/2019   PLT 167 07/25/2019   Lab Results  Component Value Date   NA 141 07/25/2019   K 4.0 07/25/2019   CO2 29 07/25/2019   GLUCOSE 98 07/25/2019   BUN 10 07/25/2019   CREATININE 0.72 07/25/2019   BILITOT 0.4 07/25/2019   ALKPHOS 94 08/22/2017   AST 15 07/25/2019   ALT 19 07/25/2019   PROT 6.5 07/25/2019   ALBUMIN 4.4 08/22/2017   CALCIUM 9.5 07/25/2019   Lab Results  Component Value Date   CHOL 205 (H) 07/25/2019   Lab Results  Component Value Date   HDL 73 07/25/2019   Lab Results  Component Value Date   LDLCALC 110 (H) 07/25/2019   Lab Results  Component Value Date   TRIG 114 07/25/2019   Lab Results  Component Value Date   CHOLHDL 2.8 07/25/2019   Lab Results  Component Value Date   HGBA1C 5.2 08/22/2017       Assessment & Plan:   1. Acute contact dermatitis Symptoms and presentation consistent with contact dermatitis and allergic reaction  to a component of the transdermal patch. It is unclear at this time if the reaction is to an ingredient in the patch or possibly the adhesive used for the patch.  Triamcinolone has been ineffective. Given the severity of the rash, systemic steroids may be necessary to help reduce the inflammation and irritation to the skin and to help with the intense pruritis.  Plan for oral prednisone for 5 days in addition to hydroxyzine at bedtime. Will also trial silvadene cream to the areas of redness as they do have a component of burnt skin present.  Follow-up if symptoms worsen or fail to improve.  - hydrOXYzine (ATARAX/VISTARIL) 50 MG tablet; Take 1 tablet (50 mg total) by mouth at bedtime and may repeat dose one time if needed. For itching.  Dispense: 60 tablet; Refill: 3 - silver sulfADIAZINE (SILVADENE) 1 % cream; Apply 1 application topically daily.  Dispense: 50 g; Refill: 1 - predniSONE (DELTASONE) 20 MG tablet; Take 1 tablet (20 mg total) by mouth daily with breakfast.  Dispense: 5 tablet; Refill: 1  2. Contact burn Appearance of significant erythema and irritation present where applications of transdermal patches were placed. The areas do have a chemical burn appearance and were non response to triamcinolone cream.  Will trial silvadene cream  to the areas of redness in addition to oral medications for pruritis and inflammation.  Discussed the possibility that a dermatology referral may be needed if the current treatment regimen is not effective.  Follow-up if symptoms worsen or fail to improve.  - silver sulfADIAZINE (SILVADENE) 1 % cream; Apply 1 application topically daily.  Dispense: 50 g; Refill: 1   Orma Render, NP

## 2020-02-04 NOTE — Patient Instructions (Signed)
Contact Dermatitis Dermatitis is redness, soreness, and swelling (inflammation) of the skin. Contact dermatitis is a reaction to certain substances that touch the skin. Many different substances can cause contact dermatitis. There are two types of contact dermatitis:  Irritant contact dermatitis. This type is caused by something that irritates your skin, such as having dry hands from washing them too often with soap. This type does not require previous exposure to the substance for a reaction to occur. This is the most common type.  Allergic contact dermatitis. This type is caused by a substance that you are allergic to, such as poison ivy. This type occurs when you have been exposed to the substance (allergen) and develop a sensitivity to it. Dermatitis may develop soon after your first exposure to the allergen, or it may not develop until the next time you are exposed and every time thereafter. What are the causes? Irritant contact dermatitis is most commonly caused by exposure to:  Makeup.  Soaps.  Detergents.  Bleaches.  Acids.  Metal salts, such as nickel. Allergic contact dermatitis is most commonly caused by exposure to:  Poisonous plants.  Chemicals.  Jewelry.  Latex.  Medicines.  Preservatives in products, such as clothing. What increases the risk? You are more likely to develop this condition if you have:  A job that exposes you to irritants or allergens.  Certain medical conditions, such as asthma or eczema. What are the signs or symptoms? Symptoms of this condition may occur on your body anywhere the irritant has touched you or is touched by you.  Symptoms include: ? Dryness or flaking. ? Redness. ? Cracks. ? Itching. ? Pain or a burning feeling. ? Blisters. ? Drainage of small amounts of blood or clear fluid from skin cracks. With allergic contact dermatitis, there may also be swelling in areas such as the eyelids, mouth, or genitals. How is this  diagnosed? This condition is diagnosed with a medical history and physical exam.  A patch skin test may be performed to help determine the cause.  If the condition is related to your job, you may need to see an occupational medicine specialist. How is this treated? This condition is treated by checking for the cause of the reaction and protecting your skin from further contact. Treatment may also include:  Steroid creams or ointments. Oral steroid medicines may be needed in more severe cases.  Antibiotic medicines or antibacterial ointments, if a skin infection is present.  Antihistamine lotion or an antihistamine taken by mouth to ease itching.  A bandage (dressing). Follow these instructions at home: Skin care  Moisturize your skin as needed.  Apply cool compresses to the affected areas.  Try applying baking soda paste to your skin. Stir water into baking soda until it reaches a paste-like consistency.  Do not scratch your skin, and avoid friction to the affected area.  Avoid the use of soaps, perfumes, and dyes. Medicines  Take or apply over-the-counter and prescription medicines only as told by your health care provider.  If you were prescribed an antibiotic medicine, take or apply the antibiotic as told by your health care provider. Do not stop using the antibiotic even if your condition improves. Bathing  Try taking a bath with: ? Epsom salts. Follow the instructions on the packaging. You can get these at your local pharmacy or grocery store. ? Baking soda. Pour a small amount into the bath as directed by your health care provider. ? Colloidal oatmeal. Follow the instructions on the  packaging. You can get this at your local pharmacy or grocery store.  Bathe less frequently, such as every other day.  Bathe in lukewarm water. Avoid using hot water. Bandage care  If you were given a bandage (dressing), change it as told by your health care provider.  Wash your hands  with soap and water before and after you change your dressing. If soap and water are not available, use hand sanitizer. General instructions  Avoid the substance that caused your reaction. If you do not know what caused it, keep a journal to try to track what caused it. Write down: ? What you eat. ? What cosmetic products you use. ? What you drink. ? What you wear in the affected area. This includes jewelry.  Check the affected areas every day for signs of infection. Check for: ? More redness, swelling, or pain. ? More fluid or blood. ? Warmth. ? Pus or a bad smell.  Keep all follow-up visits as told by your health care provider. This is important. Contact a health care provider if:  Your condition does not improve with treatment.  Your condition gets worse.  You have signs of infection such as swelling, tenderness, redness, soreness, or warmth in the affected area.  You have a fever.  You have new symptoms. Get help right away if:  You have a severe headache, neck pain, or neck stiffness.  You vomit.  You feel very sleepy.  You notice red streaks coming from the affected area.  Your bone or joint underneath the affected area becomes painful after the skin has healed.  The affected area turns darker.  You have difficulty breathing. Summary  Dermatitis is redness, soreness, and swelling (inflammation) of the skin. Contact dermatitis is a reaction to certain substances that touch the skin.  Symptoms of this condition may occur on your body anywhere the irritant has touched you or is touched by you.  This condition is treated by figuring out what caused the reaction and protecting your skin from further contact. Treatment may also include medicines and skin care.  Avoid the substance that caused your reaction. If you do not know what caused it, keep a journal to try to track what caused it.  Contact a health care provider if your condition gets worse or you have signs  of infection such as swelling, tenderness, redness, soreness, or warmth in the affected area. This information is not intended to replace advice given to you by your health care provider. Make sure you discuss any questions you have with your health care provider. Document Revised: 10/04/2018 Document Reviewed: 12/28/2017 Elsevier Patient Education  [place].

## 2020-02-07 ENCOUNTER — Emergency Department (HOSPITAL_COMMUNITY): Admission: EM | Admit: 2020-02-07 | Discharge: 2020-02-08 | Discharge status: Against Medical Advice | Payer: 59

## 2020-02-07 NOTE — ED Notes (Signed)
Pt stated that she will be leaving and following up with primary care in the morning.

## 2020-02-08 ENCOUNTER — Encounter: Payer: Self-pay | Admitting: Nurse Practitioner

## 2020-02-08 ENCOUNTER — Other Ambulatory Visit: Payer: Self-pay | Admitting: Nurse Practitioner

## 2020-02-08 ENCOUNTER — Ambulatory Visit (INDEPENDENT_AMBULATORY_CARE_PROVIDER_SITE_OTHER): Payer: 59 | Admitting: Nurse Practitioner

## 2020-02-08 VITALS — BP 143/85 | HR 60 | Ht 65.0 in | Wt 190.0 lb

## 2020-02-08 DIAGNOSIS — R112 Nausea with vomiting, unspecified: Secondary | ICD-10-CM | POA: Diagnosis not present

## 2020-02-08 DIAGNOSIS — L259 Unspecified contact dermatitis, unspecified cause: Secondary | ICD-10-CM | POA: Diagnosis not present

## 2020-02-08 DIAGNOSIS — R1013 Epigastric pain: Secondary | ICD-10-CM | POA: Diagnosis not present

## 2020-02-08 DIAGNOSIS — F430 Acute stress reaction: Secondary | ICD-10-CM

## 2020-02-08 DIAGNOSIS — F410 Panic disorder [episodic paroxysmal anxiety] without agoraphobia: Secondary | ICD-10-CM

## 2020-02-08 MED ORDER — KETOCONAZOLE 2 % EX CREA: 1.0000 "application " | TOPICAL_CREAM | Freq: Two times a day (BID) | CUTANEOUS | 3 refills | Status: DC

## 2020-02-08 MED ORDER — PANTOPRAZOLE SODIUM 40 MG PO TBEC: 40.0000 mg | DELAYED_RELEASE_TABLET | Freq: Two times a day (BID) | ORAL | 3 refills | Status: DC

## 2020-02-08 MED ORDER — CLARITHROMYCIN 500 MG PO TABS: 500.0000 mg | ORAL_TABLET | Freq: Two times a day (BID) | ORAL | 0 refills | Status: DC

## 2020-02-08 MED ORDER — AMOXICILLIN 500 MG PO CAPS: 1000.0000 mg | ORAL_CAPSULE | Freq: Two times a day (BID) | ORAL | 0 refills | Status: DC

## 2020-02-08 MED ORDER — PROMETHAZINE HCL 25 MG PO TABS: 25.0000 mg | ORAL_TABLET | Freq: Four times a day (QID) | ORAL | 3 refills | Status: DC | PRN

## 2020-02-08 MED ORDER — SUCRALFATE 1 G PO TABS: 1.0000 g | ORAL_TABLET | Freq: Four times a day (QID) | ORAL | 2 refills | Status: DC

## 2020-02-08 MED ORDER — LORAZEPAM 0.5 MG PO TABS: 0.5000 mg | ORAL_TABLET | Freq: Two times a day (BID) | ORAL | 0 refills | Status: DC | PRN

## 2020-02-08 MED FILL — PROMETHAZINE 25 MG TABLET: 25 | 8 days supply | Qty: 30 | Fill #0

## 2020-02-08 MED FILL — KETOCONAZOLE 2% CREAM: 2 | 30 days supply | Qty: 60 | Fill #0

## 2020-02-08 MED FILL — CLARITHROMYCIN 500 MG TAB: 500 | 14 days supply | Qty: 28 | Fill #0

## 2020-02-08 MED FILL — PANTOPRAZOLE SOD DR 40 MG T: 40 | 30 days supply | Qty: 60 | Fill #0

## 2020-02-08 MED FILL — AMOXICILLIN 500 MG CAPSULE: 500 | 14 days supply | Qty: 56 | Fill #0

## 2020-02-08 MED FILL — LORazepam 0.5 MG TABS: 0.5 | 15 days supply | Qty: 30 | Fill #0

## 2020-02-08 MED FILL — SUCRALFATE 1 GM TABLET: 1 | 30 days supply | Qty: 120 | Fill #0

## 2020-02-08 NOTE — Patient Instructions (Signed)
Peptic Ulcer  A peptic ulcer is a painful sore in the lining of your stomach or the first part of your small intestine. What are the causes? Common causes of this condition include:  An infection.  Using certain pain medicines too often or too much. What increases the risk? You are more likely to get this condition if you:  Smoke.  Have a family history of ulcer disease.  Drink alcohol.  Have been hospitalized in an intensive care unit (ICU). What are the signs or symptoms? Symptoms include:  Burning pain in the area between the chest and the belly button. The pain may: ? Not go away (be persistent). ? Be worse when your stomach is empty. ? Be worse at night.  Heartburn.  Feeling sick to your stomach (nauseous) and throwing up (vomiting).  Bloating. If the ulcer results in bleeding, it can cause you to:  Have poop (stool) that is black and looks like tar.  Throw up bright red blood.  Throw up material that looks like coffee grounds. How is this treated? Treatment for this condition may include:  Stopping things that can cause the ulcer, such as: ? Smoking. ? Using pain medicines.  Medicines to reduce stomach acid.  Antibiotic medicines if the ulcer is caused by an infection.  A procedure that is done using a small, flexible tube that has a camera at the end (upper endoscopy). This may be done if you have a bleeding ulcer.  Surgery. This may be needed if: ? You have a lot of bleeding. ? The ulcer caused a hole somewhere in the digestive system. Follow these instructions at home:  Do not drink alcohol if your doctor tells you not to drink.  Limit how much caffeine you take in.  Do not use any products that contain nicotine or tobacco, such as cigarettes, e-cigarettes, and chewing tobacco. If you need help quitting, ask your doctor.  Take over-the-counter and prescription medicines only as told by your doctor. ? Do not stop or change your medicines unless  you talk with your doctor about it first. ? Do not take aspirin, ibuprofen, or other NSAIDs unless your doctor told you to do so.  Keep all follow-up visits as told by your doctor. This is important. Contact a doctor if:  You do not get better in 7 days after you start treatment.  You keep having an upset stomach (indigestion) or heartburn. Get help right away if:  You have sudden, sharp pain in your belly (abdomen).  You have belly pain that does not go away.  You have bloody poop (stool) or black, tarry poop.  You throw up blood. It may look like coffee grounds.  You feel light-headed or feel like you may pass out (faint).  You get weak.  You get sweaty or feel sticky and cold to the touch (clammy). Summary  Symptoms of a peptic ulcer include burning pain in the area between the chest and the belly button.  Take medicines only as told by your doctor.  Limit how much alcohol and caffeine you have.  Keep all follow-up visits as told by your doctor. This information is not intended to replace advice given to you by your health care provider. Make sure you discuss any questions you have with your health care provider. Document Revised: 12/20/2017 Document Reviewed: 12/20/2017 Elsevier Patient Education  [place].   Acute Pancreatitis  The pancreas is a gland that is located behind the stomach on the left  side of the abdomen. It produces enzymes that help to digest food. The pancreas also releases the hormones glucagon and insulin, which help to regulate blood sugar. Acute pancreatitis happens when inflammation of the pancreas suddenly occurs and the pancreas becomes irritated and swollen. Most acute attacks last a few days and cause serious problems. Some people become dehydrated and develop low blood pressure. In severe cases, bleeding in the abdomen can lead to shock and can be life-threatening. The lungs, heart, and kidneys may fail. What are the causes? This  condition may be caused by:  Alcohol abuse.  Drug abuse.  Gallstones or other conditions that can block the tube that drains the pancreas (pancreatic duct).  A tumor in the pancreas. Other causes include:  Certain medicines.  Exposure to certain chemicals.  Diabetes.  An infection in the pancreas.  Damage caused by an accident (trauma).  The poison (venom) from a scorpion bite.  Abdominal surgery.  Autoimmune pancreatitis. This is when the body's disease-fighting (immune) system attacks the pancreas.  Genes that are passed from parent to child (inherited). In some cases, the cause of this condition is not known. What are the signs or symptoms? Symptoms of this condition include:  Pain in the upper abdomen that may radiate to the back. Pain may be severe.  Tenderness and swelling of the abdomen.  Nausea and vomiting.  Fever. How is this diagnosed? This condition may be diagnosed based on:  A physical exam.  Blood tests.  Imaging tests, such as X-rays, CT or MRI scans, or an ultrasound of the abdomen. How is this treated? Treatment for this condition usually requires a stay in the hospital. Treatment for this condition may include:  Pain medicine.  Fluid replacement through an IV.  Placing a tube in the stomach to remove stomach contents and to control vomiting (NG tube, or nasogastric tube).  Not eating for 3-4 days. This gives the pancreas a rest, because enzymes are not being produced that can cause further damage.  Antibiotic medicines, if your condition is caused by an infection.  Treating any underlying conditions that may be the cause.  Steroid medicines, if your condition is caused by your immune system attacking your body's own tissues (autoimmune disease).  Surgery on the pancreas or gallbladder. Follow these instructions at home: Eating and drinking   Follow instructions from your health care provider about diet. This may involve avoiding  alcohol and decreasing the amount of fat in your diet.  Eat smaller, more frequent meals. This reduces the amount of digestive fluids that the pancreas produces.  Drink enough fluid to keep your urine pale yellow.  Do not drink alcohol if it caused your condition. General instructions  Take over-the-counter and prescription medicines only as told by your health care provider.  Do not drive or use heavy machinery while taking prescription pain medicine.  Ask your health care provider if the medicine prescribed to you can cause constipation. You may need to take steps to prevent or treat constipation, such as: ? Take an over-the-counter or prescription medicine for constipation. ? Eat foods that are high in fiber such as whole grains and beans. ? Limit foods that are high in fat and processed sugars, such as fried or sweet foods.  Do not use any products that contain nicotine or tobacco, such as cigarettes, e-cigarettes, and chewing tobacco. If you need help quitting, ask your health care provider.  Get plenty of rest.  If directed, check your blood sugar at  home as told by your health care provider.  Keep all follow-up visits as told by your health care provider. This is important. Contact a health care provider if you:  Do not recover as quickly as expected.  Develop new or worsening symptoms.  Have persistent pain, weakness, or nausea.  Recover and then have another episode of pain.  Have a fever. Get help right away if:  You cannot eat or keep fluids down.  Your pain becomes severe.  Your skin or the white part of your eyes turns yellow (jaundice).  You have sudden swelling in your abdomen.  You vomit.  You feel dizzy or you faint.  Your blood sugar is high (over 300 mg/dL). Summary  Acute pancreatitis happens when inflammation of the pancreas suddenly occurs and the pancreas becomes irritated and swollen.  This condition is typically caused by alcohol abuse,  drug abuse, or gallstones.  Treatment for this condition usually requires a stay in the hospital. This information is not intended to replace advice given to you by your health care provider. Make sure you discuss any questions you have with your health care provider. Document Revised: 04/03/2018 Document Reviewed: 12/19/2017 Elsevier Patient Education  2020 [place].   Pancreatitis Eating Plan Pancreatitis is when your pancreas becomes irritated and swollen (inflamed). The pancreas is a small organ located behind your stomach. It helps your body digest food and regulate your blood sugar. Pancreatitis can affect how your body digests food, especially foods with fat. You may also have other symptoms such as abdominal pain or nausea. When you have pancreatitis, following a low-fat eating plan may help you manage symptoms and recover more quickly. Work with your health care provider or a diet and nutrition specialist (dietitian) to create an eating plan that is right for you. What are tips for following this plan? Reading food labels Use the information on food labels to help keep track of how much fat you eat:  Check the serving size.  Look for the amount of total fat in grams (g) in one serving. ? Low-fat foods have 3 g of fat or less per serving. ? Fat-free foods have 0.5 g of fat or less per serving.  Keep track of how much fat you eat based on how many servings you eat. ? For example, if you eat two servings, the amount of fat you eat will be two times what is listed on the label. Shopping   Buy low-fat or nonfat foods, such as: ? Fresh, frozen, or canned fruits and vegetables. ? Grains, including pasta, bread, and rice. ? Lean meat, poultry, fish, and other protein foods. ? Low-fat or nonfat dairy.  Avoid buying bakery products and other sweets made with whole milk, butter, and eggs.  Avoid buying snack foods with added fat, such as anything with butter or cheese  flavoring. Cooking  Remove skin from poultry, and remove extra fat from meat.  Limit the amount of fat and oil you use to 6 teaspoons or less per day.  Cook using low-fat methods, such as boiling, broiling, grilling, steaming, or baking.  Use spray oil to cook. Add fat-free chicken broth to add flavor and moisture.  Avoid adding cream to thicken soups or sauces. Use other thickeners such as corn starch or tomato paste. Meal planning   Eat a low-fat diet as told by your dietitian. For most people, this means having no more than 55-65 grams of fat each day.  Eat small, frequent meals throughout  the day. For example, you may have 5-6 small meals instead of 3 large meals.  Drink enough fluid to keep your urine pale yellow.  Do not drink alcohol. Talk to your health care provider if you need help stopping.  Limit how much caffeine you have, including black coffee, black and green tea, caffeinated soft drinks, and energy drinks. General information  Let your health care provider or dietitian know if you have unplanned weight loss on this eating plan.  You may be instructed to follow a clear liquid diet during a flare of symptoms. Talk with your health care provider about how to manage your diet during symptoms of a flare.  Take any vitamins or supplements as told by your health care provider.  Work with a Microbiologist, especially if you have other conditions such as obesity or diabetes mellitus. What foods should I avoid? Fruits Fried fruits. Fruits served with butter or cream. Vegetables Fried vegetables. Vegetables cooked with butter, cheese, or cream. Grains Biscuits, waffles, donuts, pastries, and croissants. Pies and cookies. Butter-flavored popcorn. Regular crackers. Meats and other protein foods Fatty cuts of meat. Poultry with skin. Organ meats. Bacon, sausage, and cold cuts. Whole eggs. Nuts and nut butters. Dairy Whole and 2% milk. Whole milk yogurt. Whole milk ice cream.  Cream and half-and-half. Cream cheese. Sour cream. Cheese. Beverages Wine, beer, and liquor. The items listed above may not be a complete list of foods and beverages to avoid. Contact a dietitian for more information. Summary  Pancreatitis can affect how your body digests food, especially foods with fat.  When you have pancreatitis, it is recommended that you follow a low-fat eating plan to help you recover more quickly and manage symptoms. For most people, this means limiting fat to no more than 55-65 grams per day.  Do not drink alcohol. Limit the amount of caffeine you have, and drink enough fluid to keep your urine pale yellow. This information is not intended to replace advice given to you by your health care provider. Make sure you discuss any questions you have with your health care provider. Document Revised: 10/05/2018 Document Reviewed: 09/20/2017 Elsevier Patient Education  [place].

## 2020-02-08 NOTE — Progress Notes (Signed)
Acute Office Visit  Subjective:    Patient ID: Melissa Atkinson, female    DOB: 1970-08-16, 49 y.o.   MRN: [id number]  Chief Complaint  Patient presents with  . Abdominal Pain    HPI Patient is in today for severe epigastric pain that started about 730 pm last night. Pain reports that she was getting out of the shower and experienced sudden onset sharp epigastric pain 10/10 with a stabbing quality. During that time she also experienced nausea, and numbness in her arms bilaterally. She went to the emergency room for evaluation. While there her vital signs were all stable and the pain subsided to about a 3/10 so she decided to leave.   She reports since that time the pain is intermittent and remains sharp in quality. She has tried pepcid, but does not know if this has helped.   She was recently started on a steroid burst for dermatitis related to suspected allergic reaction to a nutrition patch she was wearing. She also takes 69m aspirin daily. She denies any other frequent NSAID or Acetaminophen use.   She denies radiation to her back or shoulder. She denies shortness of breath.  She reports that after she calmed down, she believes the numbness in her arms was due to a panic attack from the sudden onset and the pain. That has not returned.   She has had an increased amount of stress over the past week with the sudden overdose death of her best friends son. She and her husband report that has impacted her significantly.   She has been on a restricted diet recently and they do report the night before the incident they went out and ate foods they have not eaten in 6 weeks. She also had 4 beers and she has not had alcohol in over 6 weeks, but has never been a regular alcohol drinker.   She reports the pain comes and goes in spasms and is worse when laying flat.  She denies bloody or dark stools, she denies history of gastric ulcers, she denies vomiting blood, dizziness, lightheadedness, weakness,  or near syncope.   The pain is localized to the epigastrium with no radiation.   Past Medical History:  Diagnosis Date  . Anxiety   . Back pain   . Bicuspid aortic valve   . Breast cancer ([place]   . Depression   . Joint pain   . Kidney cyst, acquired   . Obesity   . Palpitations   . [place]mia vera ([facility]   . Thoracic aortic aneurysm ([facility]   . Thyroid nodule     Past Surgical History:  Procedure Laterality Date  . DILITATION & CURRETTAGE/HYSTROSCOPY WITH THERMACHOICE ABLATION N/A 12/28/2012   Procedure: DILATATION & CURETTAGE/HYSTEROSCOPY WITH THERMACHOICE ABLATION;  Surgeon: MCyril Mourning MD;  [facility];  Service: Gynecology;  Laterality: N/A;  . MASTECTOMY     bilateral  . PARTIAL NEPHRECTOMY     RT   . TONSILLECTOMY    . TUBAL LIGATION      Family History  Problem Relation Age of Onset  . Cancer Mother        lung  . Depression Mother   . Anxiety disorder Mother   . Bipolar disorder Mother   . Alcoholism Mother   . COPD Father   . Congestive Heart Failure Father   . Depression Father   . Alcoholism Father   . Stroke Maternal Grandfather     Social History   Socioeconomic  History  . Marital status: Married    Spouse name: Timmothy Sours  . Number of children: 3  . Years of education: Not on file  . Highest education level: Not on file  Occupational History  . Occupation: Programmer, multimedia:   Tobacco Use  . Smoking status: Never Smoker  . Smokeless tobacco: Never Used  Vaping Use  . Vaping Use: Never used  Substance and Sexual Activity  . Alcohol use: Yes    Comment: Occasional  . Drug use: No  . Sexual activity: Not on file  Other Topics Concern  . Not on file  Social History Narrative  . Not on file   Social Determinants of Health   Financial Resource Strain:   . Difficulty of Paying Living Expenses:   Food Insecurity:   . Worried About Charity fundraiser in the Last Year:   . Arboriculturist in the Last Year:   Transportation  Needs:   . Film/video editor (Medical):   Marland Kitchen Lack of Transportation (Non-Medical):   Physical Activity:   . Days of Exercise per Week:   . Minutes of Exercise per Session:   Stress:   . Feeling of Stress :   Social Connections:   . Frequency of Communication with Friends and Family:   . Frequency of Social Gatherings with Friends and Family:   . Attends Religious Services:   . Active Member of Clubs or Organizations:   . Attends Archivist Meetings:   Marland Kitchen Marital Status:   Intimate Partner Violence:   . Fear of Current or Ex-Partner:   . Emotionally Abused:   Marland Kitchen Physically Abused:   . Sexually Abused:     Outpatient Medications Prior to Visit  Medication Sig Dispense Refill  . aspirin 81 MG tablet Take 81 mg by mouth daily.     Marland Kitchen BYSTOLIC 10 MG tablet TAKE 1 TABLET (10 MG TOTAL) BY MOUTH DAILY. 90 tablet 3  . famotidine (PEPCID) 20 MG tablet     . hydrochlorothiazide (MICROZIDE) 12.5 MG capsule Take 1 capsule (12.5 mg total) by mouth daily. 90 capsule 3  . hydrOXYzine (ATARAX/VISTARIL) 50 MG tablet Take 1 tablet (50 mg total) by mouth at bedtime and may repeat dose one time if needed. For itching. 60 tablet 3  . silver sulfADIAZINE (SILVADENE) 1 % cream Apply 1 application topically daily. 50 g 1  . Simethicone (MYLICON PO)     . triamcinolone cream (KENALOG) 0.1 % APPLY TOPICALLY 2 TIMES DAILY. 454 g 11  . predniSONE (DELTASONE) 20 MG tablet Take 1 tablet (20 mg total) by mouth daily with breakfast. 5 tablet 1  . losartan (COZAAR) 25 MG tablet Take 1 tablet (25 mg total) by mouth daily. 90 tablet 3   No facility-administered medications prior to visit.    No Known Allergies     Objective:    Physical Exam Vitals and nursing note reviewed.  Constitutional:      Appearance: She is well-developed. She is not ill-appearing or diaphoretic.  HENT:     Head: Normocephalic.  Cardiovascular:     Rate and Rhythm: Normal rate and regular rhythm.     Heart sounds:  Normal heart sounds. No murmur heard.  No friction rub. No gallop.   Pulmonary:     Effort: Pulmonary effort is normal. No respiratory distress.     Breath sounds: Normal breath sounds. No wheezing or rhonchi.  Chest:     Chest  wall: No tenderness.  Abdominal:     General: Abdomen is flat. Bowel sounds are normal. There is no distension or abdominal bruit. There are no signs of injury.     Palpations: Abdomen is soft. There is no shifting dullness, fluid wave, hepatomegaly, splenomegaly, mass or pulsatile mass.     Tenderness: There is abdominal tenderness in the epigastric area. There is guarding. There is no rebound. Negative signs include Murphy's sign, Rovsing's sign, McBurney's sign, psoas sign and obturator sign.     Hernia: No hernia is present.  Skin:    General: Skin is warm and dry.     Capillary Refill: Capillary refill takes less than 2 seconds.  Neurological:     General: No focal deficit present.     Mental Status: She is alert and oriented to person, place, and time.  Psychiatric:        Mood and Affect: Mood normal.        Behavior: Behavior normal.     BP (!) 143/85   Pulse 60   Ht 5' 5"  (1.651 m)   Wt 190 lb (86.2 kg)   SpO2 100%   BMI 31.62 kg/m  Wt Readings from Last 3 Encounters:  02/08/20 190 lb (86.2 kg)  02/04/20 191 lb 11.2 oz (87 kg)  12/26/19 196 lb 1.6 oz (89 kg)    Health Maintenance Due  Topic Date Due  . PAP SMEAR-Modifier  01/11/2020  . INFLUENZA VACCINE  01/27/2020    There are no preventive care reminders to display for this patient.   Lab Results  Component Value Date   TSH 0.96 07/25/2019   Lab Results  Component Value Date   WBC 6.0 07/25/2019   HGB 13.1 07/25/2019   HCT 39.4 07/25/2019   MCV 84.9 07/25/2019   PLT 167 07/25/2019   Lab Results  Component Value Date   NA 141 07/25/2019   K 4.0 07/25/2019   CO2 29 07/25/2019   GLUCOSE 98 07/25/2019   BUN 10 07/25/2019   CREATININE 0.72 07/25/2019   BILITOT 0.4  07/25/2019   ALKPHOS 94 08/22/2017   AST 15 07/25/2019   ALT 19 07/25/2019   PROT 6.5 07/25/2019   ALBUMIN 4.4 08/22/2017   CALCIUM 9.5 07/25/2019   Lab Results  Component Value Date   CHOL 205 (H) 07/25/2019   Lab Results  Component Value Date   HDL 73 07/25/2019   Lab Results  Component Value Date   LDLCALC 110 (H) 07/25/2019   Lab Results  Component Value Date   TRIG 114 07/25/2019   Lab Results  Component Value Date   CHOLHDL 2.8 07/25/2019   Lab Results  Component Value Date   HGBA1C 5.2 08/22/2017       Assessment & Plan:   1. Acute epigastric pain Epigastric pain of severe quality of unknown etiology with sudden onset last night.  Differentials include peptic ulcer, gastritis, H.Pylori, and possible acute pancreatitis.  My strong suspicion is peptic ulcer/gastritis related to recent increased stress and use of oral steroids.  No concerns for cardiac etiology given the descriptors of the pain and physical assessment. VSS and patient not in acute distress.  Plan to obtain labs today to evaluate Hgb, WBC, Liver function, and Amylase, Lipase.  Will start twice daily protonix and 4 times a day carafate.  Discussed with the patient to maintain a bland or npo-like diet until lab results have come back to determine if this could be the pancreas. If  no signs of pancreatitis, infection, or liver dysfunction will plan to proceed with prophylactic treatment for H. Pylori and continue on high dose protonix and carafate.  Discussed the plan and options for imaging with the patient. She is agreeable to the current plan and understands how to proceed.  - COMPLETE METABOLIC PANEL WITH GFR - Amylase - Lipase - pantoprazole (PROTONIX) 40 MG tablet; Take 1 tablet (40 mg total) by mouth 2 (two) times daily.  Dispense: 60 tablet; Refill: 3 - sucralfate (CARAFATE) 1 g tablet; Take 1 tablet (1 g total) by mouth 4 (four) times daily.  Dispense: 120 tablet; Refill: 2 - clarithromycin  (BIAXIN) 500 MG tablet; Take 1 tablet (500 mg total) by mouth 2 (two) times daily.  Dispense: 28 tablet; Refill: 0 - amoxicillin (AMOXIL) 500 MG capsule; Take 2 capsules (1,000 mg total) by mouth 2 (two) times daily.  Dispense: 54 capsule; Refill: 0 - CBC with Differential/Platelet  2. Panic attack due to exceptional stress Panic attack resulting in worsening of symptoms with recent onset of GI pain. She has taken lorazepam in the past with success. Prescription provided for patient in the event she has symptoms again.  - LORazepam (ATIVAN) 0.5 MG tablet; Take 1 tablet (0.5 mg total) by mouth 2 (two) times daily as needed for anxiety.  Dispense: 30 tablet; Refill: 0  3. Nausea and vomiting, intractability of vomiting not specified, unspecified vomiting type Phenergan prescription provided for nausea. - promethazine (PHENERGAN) 25 MG tablet; Take 1 tablet (25 mg total) by mouth every 6 (six) hours as needed for nausea or vomiting.  Dispense: 30 tablet; Refill: 3  4. Acute contact dermatitis Switching treatment for contact dermatitis to ketoconazole.  - ketoconazole (NIZORAL) 2 % cream; Apply 1 application topically 2 (two) times daily. To affected areas.  Dispense: 60 g; Refill: 3   Orma Render, NP

## 2020-02-09 ENCOUNTER — Ambulatory Visit (HOSPITAL_BASED_OUTPATIENT_CLINIC_OR_DEPARTMENT_OTHER)
Admission: RE | Admit: 2020-02-09 | Discharge: 2020-02-09 | Disposition: A | Discharge status: Home / Self Care | Payer: 59 | Source: Ambulatory Visit | Attending: Sports Medicine | Admitting: Sports Medicine

## 2020-02-09 ENCOUNTER — Other Ambulatory Visit: Payer: Self-pay

## 2020-02-09 ENCOUNTER — Other Ambulatory Visit: Payer: Self-pay | Admitting: Nurse Practitioner

## 2020-02-09 DIAGNOSIS — R112 Nausea with vomiting, unspecified: Secondary | ICD-10-CM

## 2020-02-09 DIAGNOSIS — R748 Abnormal levels of other serum enzymes: Secondary | ICD-10-CM | POA: Diagnosis present

## 2020-02-09 DIAGNOSIS — K8020 Calculus of gallbladder without cholecystitis without obstruction: Secondary | ICD-10-CM | POA: Diagnosis not present

## 2020-02-09 DIAGNOSIS — R1013 Epigastric pain: Secondary | ICD-10-CM | POA: Insufficient documentation

## 2020-02-09 DIAGNOSIS — K8012 Calculus of gallbladder with acute and chronic cholecystitis without obstruction: Secondary | ICD-10-CM

## 2020-02-09 LAB — COMPLETE METABOLIC PANEL WITH GFR
AG Ratio: 1.6 (calc) (ref 1.0–2.5)
ALT: 1324 U/L — ABNORMAL HIGH (ref 6–29)
AST: 1608 U/L — ABNORMAL HIGH (ref 10–35)
Albumin: 4 g/dL (ref 3.6–5.1)
Alkaline phosphatase (APISO): 150 U/L (ref 37–153)
BUN: 12 mg/dL (ref 7–25)
CO2: 30 mmol/L (ref 20–32)
Calcium: 9.4 mg/dL (ref 8.6–10.4)
Chloride: 100 mmol/L (ref 98–110)
Creat: 0.84 mg/dL (ref 0.50–1.05)
GFR, Est African American: 94 mL/min/{1.73_m2} (ref >=60)
GFR, Est Non African American: 81 mL/min/{1.73_m2} (ref >=60)
Globulin: 2.5 g/dL (calc) (ref 1.9–3.7)
Glucose, Bld: 105 mg/dL — ABNORMAL HIGH (ref 65–99)
Potassium: 3.5 mmol/L (ref 3.5–5.3)
Sodium: 138 mmol/L (ref 135–146)
Total Bilirubin: 2.3 mg/dL — ABNORMAL HIGH (ref 0.2–1.2)
Total Protein: 6.5 g/dL (ref 6.1–8.1)

## 2020-02-09 LAB — CBC WITH DIFFERENTIAL/PLATELET
Absolute Monocytes: 845 cells/uL (ref 200–950)
Basophils Absolute: 58 cells/uL (ref 0–200)
Basophils Relative: 0.9 %
Eosinophils Absolute: 307 cells/uL (ref 15–500)
Eosinophils Relative: 4.8 %
HCT: 42.2 % (ref 35.0–45.0)
Hemoglobin: 14 g/dL (ref 11.7–15.5)
Lymphs Abs: 1139 cells/uL (ref 850–3900)
MCH: 28.5 pg (ref 27.0–33.0)
MCHC: 33.2 g/dL (ref 32.0–36.0)
MCV: 85.9 fL (ref 80.0–100.0)
MPV: 10.9 fL (ref 7.5–12.5)
Monocytes Relative: 13.2 %
Neutro Abs: 4051 cells/uL (ref 1500–7800)
Neutrophils Relative %: 63.3 %
Platelets: 215 10*3/uL (ref 140–400)
RBC: 4.91 10*6/uL (ref 3.80–5.10)
RDW: 13.1 % (ref 11.0–15.0)
Total Lymphocyte: 17.8 %
WBC: 6.4 10*3/uL (ref 3.8–10.8)

## 2020-02-09 LAB — LIPASE: Lipase: 31 U/L (ref 7–60)

## 2020-02-09 LAB — AMYLASE: Amylase: 28 U/L (ref 21–101)

## 2020-02-09 NOTE — Progress Notes (Signed)
Significantly elevated AST and ALT with elevated bilirubin levels in the setting of acute onset epigastric pain with nausea. Discussed with patient and plan for STAT Ultrasound today at [facility] for further evaluation.

## 2020-02-09 NOTE — Progress Notes (Signed)
Abdominal ultrasound findings reveal the presence of gallstones up to 58m in diameter with gallbladder wall thickening and contracted gallbladder. Common bile duct normal in appearance with no mention of obstruction. Appearance and symptoms consistent with acute on chronic cholecystitis. Discussed findings with the patient. Orders placed for referral [facility] General [facility]  Discussed avoiding high fat and liver irritating foods and substances including alcohol and acetaminophen. Discussed pain management options with NSAIDs and offered narcotic for severe pain. Patient elected to avoid narcotic pain management at this time.   Discussed emergency symptoms including pain that does not subside with pain medications or pain lasting more than 2-3 hours or inability to hold down liquids which would warrant evaluation in the emergency department. Patient. advised to call with any questions or concerns. Pt expressed understanding

## 2020-02-12 ENCOUNTER — Telehealth: Payer: Self-pay | Admitting: General Surgery

## 2020-02-12 ENCOUNTER — Other Ambulatory Visit: Payer: Self-pay

## 2020-02-12 ENCOUNTER — Other Ambulatory Visit: Payer: Self-pay | Admitting: Surgery

## 2020-02-12 ENCOUNTER — Encounter (HOSPITAL_COMMUNITY): Payer: Self-pay

## 2020-02-12 DIAGNOSIS — Z20822 Contact with and (suspected) exposure to covid-19: Secondary | ICD-10-CM | POA: Diagnosis present

## 2020-02-12 DIAGNOSIS — K819 Cholecystitis, unspecified: Secondary | ICD-10-CM | POA: Diagnosis not present

## 2020-02-12 DIAGNOSIS — Z823 Family history of stroke: Secondary | ICD-10-CM

## 2020-02-12 DIAGNOSIS — T3695XA Adverse effect of unspecified systemic antibiotic, initial encounter: Secondary | ICD-10-CM | POA: Diagnosis not present

## 2020-02-12 DIAGNOSIS — R10816 Epigastric abdominal tenderness: Secondary | ICD-10-CM | POA: Diagnosis not present

## 2020-02-12 DIAGNOSIS — K521 Toxic gastroenteritis and colitis: Secondary | ICD-10-CM | POA: Diagnosis not present

## 2020-02-12 DIAGNOSIS — F419 Anxiety disorder, unspecified: Secondary | ICD-10-CM | POA: Diagnosis not present

## 2020-02-12 DIAGNOSIS — K7581 Nonalcoholic steatohepatitis (NASH): Secondary | ICD-10-CM | POA: Diagnosis present

## 2020-02-12 DIAGNOSIS — Q231 Congenital insufficiency of aortic valve: Secondary | ICD-10-CM

## 2020-02-12 DIAGNOSIS — I712 Thoracic aortic aneurysm, without rupture: Secondary | ICD-10-CM | POA: Diagnosis present

## 2020-02-12 DIAGNOSIS — K8020 Calculus of gallbladder without cholecystitis without obstruction: Secondary | ICD-10-CM | POA: Diagnosis not present

## 2020-02-12 DIAGNOSIS — I10 Essential (primary) hypertension: Secondary | ICD-10-CM | POA: Diagnosis present

## 2020-02-12 DIAGNOSIS — F329 Major depressive disorder, single episode, unspecified: Secondary | ICD-10-CM | POA: Diagnosis present

## 2020-02-12 DIAGNOSIS — R748 Abnormal levels of other serum enzymes: Secondary | ICD-10-CM | POA: Diagnosis present

## 2020-02-12 DIAGNOSIS — K759 Inflammatory liver disease, unspecified: Secondary | ICD-10-CM | POA: Diagnosis not present

## 2020-02-12 DIAGNOSIS — Z79899 Other long term (current) drug therapy: Secondary | ICD-10-CM

## 2020-02-12 DIAGNOSIS — Z811 Family history of alcohol abuse and dependence: Secondary | ICD-10-CM

## 2020-02-12 DIAGNOSIS — Z825 Family history of asthma and other chronic lower respiratory diseases: Secondary | ICD-10-CM

## 2020-02-12 DIAGNOSIS — I82611 Acute embolism and thrombosis of superficial veins of right upper extremity: Secondary | ICD-10-CM | POA: Diagnosis present

## 2020-02-12 DIAGNOSIS — R7989 Other specified abnormal findings of blood chemistry: Secondary | ICD-10-CM | POA: Diagnosis not present

## 2020-02-12 DIAGNOSIS — K8066 Calculus of gallbladder and bile duct with acute and chronic cholecystitis without obstruction: Secondary | ICD-10-CM | POA: Diagnosis not present

## 2020-02-12 DIAGNOSIS — Z818 Family history of other mental and behavioral disorders: Secondary | ICD-10-CM

## 2020-02-12 DIAGNOSIS — E669 Obesity, unspecified: Secondary | ICD-10-CM | POA: Diagnosis present

## 2020-02-12 DIAGNOSIS — Z8249 Family history of ischemic heart disease and other diseases of the circulatory system: Secondary | ICD-10-CM

## 2020-02-12 DIAGNOSIS — Z853 Personal history of malignant neoplasm of breast: Secondary | ICD-10-CM

## 2020-02-12 DIAGNOSIS — K859 Acute pancreatitis without necrosis or infection, unspecified: Secondary | ICD-10-CM | POA: Diagnosis present

## 2020-02-12 DIAGNOSIS — Z905 Acquired absence of kidney: Secondary | ICD-10-CM

## 2020-02-12 DIAGNOSIS — Z6831 Body mass index (BMI) 31.0-31.9, adult: Secondary | ICD-10-CM

## 2020-02-12 DIAGNOSIS — R1011 Right upper quadrant pain: Secondary | ICD-10-CM | POA: Diagnosis not present

## 2020-02-12 DIAGNOSIS — Z801 Family history of malignant neoplasm of trachea, bronchus and lung: Secondary | ICD-10-CM

## 2020-02-12 DIAGNOSIS — R200 Anesthesia of skin: Secondary | ICD-10-CM | POA: Diagnosis not present

## 2020-02-12 DIAGNOSIS — Z9013 Acquired absence of bilateral breasts and nipples: Secondary | ICD-10-CM

## 2020-02-12 DIAGNOSIS — E041 Nontoxic single thyroid nodule: Secondary | ICD-10-CM | POA: Diagnosis present

## 2020-02-12 LAB — COMPREHENSIVE METABOLIC PANEL
ALT: 776 U/L — ABNORMAL HIGH (ref 0–44)
AST: 855 U/L — ABNORMAL HIGH (ref 15–41)
Albumin: 4.4 g/dL (ref 3.5–5.0)
Alkaline Phosphatase: 220 U/L — ABNORMAL HIGH (ref 38–126)
Anion gap: 13 (ref 5–15)
BUN: 12 mg/dL (ref 6–20)
CO2: 28 mmol/L (ref 22–32)
Calcium: 9.3 mg/dL (ref 8.9–10.3)
Chloride: 97 mmol/L — ABNORMAL LOW (ref 98–111)
Creatinine, Ser: 0.74 mg/dL (ref 0.44–1.00)
GFR calc Af Amer: >60 mL/min (ref >60)
GFR calc non Af Amer: >60 mL/min (ref >60)
Glucose, Bld: 122 mg/dL — ABNORMAL HIGH (ref 70–99)
Potassium: 3.2 mmol/L — ABNORMAL LOW (ref 3.5–5.1)
Sodium: 138 mmol/L (ref 135–145)
Total Bilirubin: 2.3 mg/dL — ABNORMAL HIGH (ref 0.3–1.2)
Total Protein: 7.4 g/dL (ref 6.5–8.1)

## 2020-02-12 LAB — CBC
HCT: 40.5 % (ref 36.0–46.0)
Hemoglobin: 13.3 g/dL (ref 12.0–15.0)
MCH: 29 pg (ref 26.0–34.0)
MCHC: 32.8 g/dL (ref 30.0–36.0)
MCV: 88.4 fL (ref 80.0–100.0)
Platelets: 163 10*3/uL (ref 150–400)
RBC: 4.58 MIL/uL (ref 3.87–5.11)
RDW: 13.3 % (ref 11.5–15.5)
WBC: 9 10*3/uL (ref 4.0–10.5)
nRBC: 0 % (ref 0.0–0.2)

## 2020-02-12 LAB — URINALYSIS, ROUTINE W REFLEX MICROSCOPIC
Bacteria, UA: NONE SEEN
Bilirubin Urine: NEGATIVE
Glucose, UA: NEGATIVE mg/dL
Ketones, ur: NEGATIVE mg/dL
Leukocytes,Ua: NEGATIVE
Nitrite: NEGATIVE
Protein, ur: NEGATIVE mg/dL
Specific Gravity, Urine: 1.009 (ref 1.005–1.030)
pH: 8 (ref 5.0–8.0)

## 2020-02-12 LAB — LIPASE, BLOOD: Lipase: 31 U/L (ref 11–51)

## 2020-02-12 LAB — I-STAT BETA HCG BLOOD, ED (MC, WL, AP ONLY): I-stat hCG, quantitative: <5 m[IU]/mL (ref <5)

## 2020-02-12 NOTE — Telephone Encounter (Signed)
Patient called answering service evening complaining of persistent right upper quadrant epigastric pain now with nausea and vomiting.  Cannot keep anything down.  Cannot keep the oral antibiotic down.  Patient was seen by her PCP a few days ago for similar symptoms.  She had a T bilirubin of 2-1/2 as well as an AST and ALT of around 1600 &1300.  She had an ultrasound done at that time which showed gallstones and possible cholecystitis . she was seen urgently today by Dr. Ninfa Linden in our clinic this morning with repeat labs drawn along with a hepatitis panel and was booked for laparoscopic cholecystectomy for next Monday but was instructed to call the office if her pain worsened or she developed additional symptoms.  Unfortunately the stat labs that were [facility] this [facility] have not resulted.  I advised the patient that at this point since she has nausea and vomiting and cannot keep liquids down her best course of action is to come to the emergency room for evaluation for labs as well as IV fluids and probable IV antibiotic.  Unfortunately there are significant wait times at all emergency rooms throughout the system.  I do not think a direct admit would be any quicker due to the bed situation throughout the system and I believe the patient needs to be evaluated/triaged as opposed to waiting at home for potentially many hours for a bed.  I also explained to the patient that I am concerned (as Dr Ninfa Linden was) she have more going on than straightforward cholecystitis given a transaminase level in the mid 1000.  She could have a common bile duct stone or hepatitis.  I discussed her case with one of the ER physicians as well as the charge nurse at [facility].  Patient voiced understanding and stated that she was on her way to the emergency room

## 2020-02-12 NOTE — ED Triage Notes (Signed)
Pt sts RUQ abdominal for several days. Reports she still  Has gallbladder and has hx of elevated LFTs.

## 2020-02-13 ENCOUNTER — Observation Stay (HOSPITAL_COMMUNITY): Payer: 59 | Admitting: Anesthesiology

## 2020-02-13 ENCOUNTER — Observation Stay (HOSPITAL_COMMUNITY): Payer: 59

## 2020-02-13 ENCOUNTER — Encounter (HOSPITAL_COMMUNITY): Payer: Self-pay

## 2020-02-13 ENCOUNTER — Inpatient Hospital Stay (HOSPITAL_COMMUNITY)
Admission: EM | Admit: 2020-02-13 | Discharge: 2020-02-21 | DRG: 417 | Disposition: A | Discharge status: Home / Self Care | Payer: 59 | Attending: Physician Assistant | Admitting: Physician Assistant

## 2020-02-13 ENCOUNTER — Encounter (HOSPITAL_COMMUNITY): Admission: EM | Disposition: A | Discharge status: Home / Self Care | Payer: Self-pay | Source: Home / Self Care

## 2020-02-13 DIAGNOSIS — Z818 Family history of other mental and behavioral disorders: Secondary | ICD-10-CM | POA: Diagnosis not present

## 2020-02-13 DIAGNOSIS — K858 Other acute pancreatitis without necrosis or infection: Secondary | ICD-10-CM | POA: Diagnosis not present

## 2020-02-13 DIAGNOSIS — E669 Obesity, unspecified: Secondary | ICD-10-CM

## 2020-02-13 DIAGNOSIS — K8020 Calculus of gallbladder without cholecystitis without obstruction: Secondary | ICD-10-CM

## 2020-02-13 DIAGNOSIS — Z9049 Acquired absence of other specified parts of digestive tract: Secondary | ICD-10-CM | POA: Diagnosis not present

## 2020-02-13 DIAGNOSIS — Z79899 Other long term (current) drug therapy: Secondary | ICD-10-CM | POA: Diagnosis not present

## 2020-02-13 DIAGNOSIS — K8066 Calculus of gallbladder and bile duct with acute and chronic cholecystitis without obstruction: Secondary | ICD-10-CM | POA: Diagnosis present

## 2020-02-13 DIAGNOSIS — K819 Cholecystitis, unspecified: Secondary | ICD-10-CM | POA: Diagnosis present

## 2020-02-13 DIAGNOSIS — K859 Acute pancreatitis without necrosis or infection, unspecified: Secondary | ICD-10-CM

## 2020-02-13 DIAGNOSIS — Q2381 Bicuspid aortic valve: Secondary | ICD-10-CM

## 2020-02-13 DIAGNOSIS — Z905 Acquired absence of kidney: Secondary | ICD-10-CM | POA: Diagnosis not present

## 2020-02-13 DIAGNOSIS — E559 Vitamin D deficiency, unspecified: Secondary | ICD-10-CM | POA: Diagnosis not present

## 2020-02-13 DIAGNOSIS — K9189 Other postprocedural complications and disorders of digestive system: Secondary | ICD-10-CM | POA: Diagnosis not present

## 2020-02-13 DIAGNOSIS — K432 Incisional hernia without obstruction or gangrene: Secondary | ICD-10-CM

## 2020-02-13 DIAGNOSIS — Z853 Personal history of malignant neoplasm of breast: Secondary | ICD-10-CM | POA: Diagnosis not present

## 2020-02-13 DIAGNOSIS — R932 Abnormal findings on diagnostic imaging of liver and biliary tract: Secondary | ICD-10-CM | POA: Diagnosis not present

## 2020-02-13 DIAGNOSIS — M7989 Other specified soft tissue disorders: Secondary | ICD-10-CM | POA: Diagnosis not present

## 2020-02-13 DIAGNOSIS — N739 Female pelvic inflammatory disease, unspecified: Secondary | ICD-10-CM

## 2020-02-13 DIAGNOSIS — K759 Inflammatory liver disease, unspecified: Secondary | ICD-10-CM

## 2020-02-13 DIAGNOSIS — Z801 Family history of malignant neoplasm of trachea, bronchus and lung: Secondary | ICD-10-CM | POA: Diagnosis not present

## 2020-02-13 DIAGNOSIS — F329 Major depressive disorder, single episode, unspecified: Secondary | ICD-10-CM | POA: Diagnosis present

## 2020-02-13 DIAGNOSIS — R748 Abnormal levels of other serum enzymes: Secondary | ICD-10-CM | POA: Diagnosis not present

## 2020-02-13 DIAGNOSIS — Z20822 Contact with and (suspected) exposure to covid-19: Secondary | ICD-10-CM | POA: Diagnosis present

## 2020-02-13 DIAGNOSIS — Q231 Congenital insufficiency of aortic valve: Secondary | ICD-10-CM

## 2020-02-13 DIAGNOSIS — K838 Other specified diseases of biliary tract: Secondary | ICD-10-CM | POA: Diagnosis not present

## 2020-02-13 DIAGNOSIS — Z6831 Body mass index (BMI) 31.0-31.9, adult: Secondary | ICD-10-CM | POA: Diagnosis not present

## 2020-02-13 DIAGNOSIS — K521 Toxic gastroenteritis and colitis: Secondary | ICD-10-CM | POA: Diagnosis not present

## 2020-02-13 DIAGNOSIS — E041 Nontoxic single thyroid nodule: Secondary | ICD-10-CM | POA: Diagnosis present

## 2020-02-13 DIAGNOSIS — Z811 Family history of alcohol abuse and dependence: Secondary | ICD-10-CM | POA: Diagnosis not present

## 2020-02-13 DIAGNOSIS — R635 Abnormal weight gain: Secondary | ICD-10-CM | POA: Diagnosis present

## 2020-02-13 DIAGNOSIS — D45 Polycythemia vera: Secondary | ICD-10-CM | POA: Diagnosis present

## 2020-02-13 DIAGNOSIS — F419 Anxiety disorder, unspecified: Secondary | ICD-10-CM | POA: Diagnosis present

## 2020-02-13 DIAGNOSIS — K8010 Calculus of gallbladder with chronic cholecystitis without obstruction: Secondary | ICD-10-CM | POA: Diagnosis not present

## 2020-02-13 DIAGNOSIS — Z87898 Personal history of other specified conditions: Secondary | ICD-10-CM

## 2020-02-13 DIAGNOSIS — N281 Cyst of kidney, acquired: Secondary | ICD-10-CM | POA: Diagnosis not present

## 2020-02-13 DIAGNOSIS — Z9013 Acquired absence of bilateral breasts and nipples: Secondary | ICD-10-CM | POA: Diagnosis not present

## 2020-02-13 DIAGNOSIS — I82611 Acute embolism and thrombosis of superficial veins of right upper extremity: Secondary | ICD-10-CM | POA: Diagnosis present

## 2020-02-13 DIAGNOSIS — K8050 Calculus of bile duct without cholangitis or cholecystitis without obstruction: Secondary | ICD-10-CM

## 2020-02-13 DIAGNOSIS — Z823 Family history of stroke: Secondary | ICD-10-CM | POA: Diagnosis not present

## 2020-02-13 DIAGNOSIS — Z8249 Family history of ischemic heart disease and other diseases of the circulatory system: Secondary | ICD-10-CM | POA: Diagnosis not present

## 2020-02-13 DIAGNOSIS — I712 Thoracic aortic aneurysm, without rupture: Secondary | ICD-10-CM | POA: Diagnosis present

## 2020-02-13 DIAGNOSIS — Z419 Encounter for procedure for purposes other than remedying health state, unspecified: Secondary | ICD-10-CM

## 2020-02-13 DIAGNOSIS — R197 Diarrhea, unspecified: Secondary | ICD-10-CM | POA: Diagnosis not present

## 2020-02-13 DIAGNOSIS — K851 Biliary acute pancreatitis without necrosis or infection: Secondary | ICD-10-CM | POA: Diagnosis not present

## 2020-02-13 DIAGNOSIS — Z6832 Body mass index (BMI) 32.0-32.9, adult: Secondary | ICD-10-CM

## 2020-02-13 DIAGNOSIS — K8000 Calculus of gallbladder with acute cholecystitis without obstruction: Secondary | ICD-10-CM | POA: Diagnosis present

## 2020-02-13 DIAGNOSIS — Z825 Family history of asthma and other chronic lower respiratory diseases: Secondary | ICD-10-CM | POA: Diagnosis not present

## 2020-02-13 DIAGNOSIS — K8046 Calculus of bile duct with acute and chronic cholecystitis without obstruction: Secondary | ICD-10-CM | POA: Diagnosis not present

## 2020-02-13 DIAGNOSIS — K7581 Nonalcoholic steatohepatitis (NASH): Secondary | ICD-10-CM | POA: Diagnosis present

## 2020-02-13 DIAGNOSIS — I10 Essential (primary) hypertension: Secondary | ICD-10-CM | POA: Diagnosis not present

## 2020-02-13 DIAGNOSIS — E876 Hypokalemia: Secondary | ICD-10-CM

## 2020-02-13 DIAGNOSIS — F418 Other specified anxiety disorders: Secondary | ICD-10-CM | POA: Diagnosis not present

## 2020-02-13 DIAGNOSIS — R7989 Other specified abnormal findings of blood chemistry: Secondary | ICD-10-CM

## 2020-02-13 DIAGNOSIS — E66811 Obesity, class 1: Secondary | ICD-10-CM

## 2020-02-13 HISTORY — PX: LAPAROSCOPIC CHOLECYSTECTOMY SINGLE PORT: SHX5891

## 2020-02-13 LAB — COMPREHENSIVE METABOLIC PANEL
ALT: 1070 U/L — ABNORMAL HIGH (ref 0–44)
AST: 1106 U/L — ABNORMAL HIGH (ref 15–41)
Albumin: 3.6 g/dL (ref 3.5–5.0)
Alkaline Phosphatase: 212 U/L — ABNORMAL HIGH (ref 38–126)
Anion gap: 11 (ref 5–15)
BUN: 8 mg/dL (ref 6–20)
CO2: 25 mmol/L (ref 22–32)
Calcium: 9 mg/dL (ref 8.9–10.3)
Chloride: 102 mmol/L (ref 98–111)
Creatinine, Ser: 0.57 mg/dL (ref 0.44–1.00)
GFR calc Af Amer: >60 mL/min (ref >60)
GFR calc non Af Amer: >60 mL/min (ref >60)
Glucose, Bld: 119 mg/dL — ABNORMAL HIGH (ref 70–99)
Potassium: 2.9 mmol/L — ABNORMAL LOW (ref 3.5–5.1)
Sodium: 138 mmol/L (ref 135–145)
Total Bilirubin: 2.8 mg/dL — ABNORMAL HIGH (ref 0.3–1.2)
Total Protein: 6.8 g/dL (ref 6.5–8.1)

## 2020-02-13 LAB — HEPATITIS PANEL, ACUTE
HCV Ab: NONREACTIVE
Hep A IgM: NONREACTIVE
Hep B C IgM: NONREACTIVE
Hepatitis B Surface Ag: NONREACTIVE

## 2020-02-13 LAB — CBC
HCT: 38.4 % (ref 36.0–46.0)
Hemoglobin: 12.6 g/dL (ref 12.0–15.0)
MCH: 28.7 pg (ref 26.0–34.0)
MCHC: 32.8 g/dL (ref 30.0–36.0)
MCV: 87.5 fL (ref 80.0–100.0)
Platelets: 150 10*3/uL (ref 150–400)
RBC: 4.39 MIL/uL (ref 3.87–5.11)
RDW: 13.3 % (ref 11.5–15.5)
WBC: 7 10*3/uL (ref 4.0–10.5)
nRBC: 0 % (ref 0.0–0.2)

## 2020-02-13 LAB — MAGNESIUM: Magnesium: 2.3 mg/dL (ref 1.7–2.4)

## 2020-02-13 LAB — SARS CORONAVIRUS 2 BY RT PCR (HOSPITAL ORDER, PERFORMED IN ~~LOC~~ HOSPITAL LAB): SARS Coronavirus 2: NEGATIVE

## 2020-02-13 SURGERY — LAPAROSCOPIC CHOLECYSTECTOMY SINGLE SITE
Anesthesia: General

## 2020-02-13 MED ORDER — EPHEDRINE 5 MG/ML INJ
INTRAVENOUS | Status: AC
  Filled 2020-02-13: qty 10

## 2020-02-13 MED ORDER — 0.9 % SODIUM CHLORIDE (POUR BTL) OPTIME
TOPICAL | Status: DC | PRN
  Administered 2020-02-13: 1000 mL

## 2020-02-13 MED ORDER — HYDROMORPHONE HCL 1 MG/ML IJ SOLN
INTRAMUSCULAR | Status: AC
  Filled 2020-02-13: qty 1

## 2020-02-13 MED ORDER — ROCURONIUM BROMIDE 10 MG/ML (PF) SYRINGE
PREFILLED_SYRINGE | INTRAVENOUS | Status: AC
  Filled 2020-02-13: qty 10

## 2020-02-13 MED ORDER — PROMETHAZINE HCL 25 MG/ML IJ SOLN: 6.2500 mg | INTRAMUSCULAR | Status: DC | PRN

## 2020-02-13 MED ORDER — GABAPENTIN 300 MG PO CAPS
300.0000 mg | ORAL_CAPSULE | ORAL | Status: AC
  Administered 2020-02-13: 300 mg via ORAL
  Filled 2020-02-13: qty 1

## 2020-02-13 MED ORDER — CHLORHEXIDINE GLUCONATE CLOTH 2 % EX PADS: 6.0000 | MEDICATED_PAD | Freq: Once | CUTANEOUS | Status: DC

## 2020-02-13 MED ORDER — SODIUM CHLORIDE 0.9 % IV SOLN
2.0000 g | INTRAVENOUS | Status: DC
  Administered 2020-02-13 – 2020-02-15 (×3): 2 g via INTRAVENOUS
  Filled 2020-02-13: qty 20
  Filled 2020-02-13 (×2): qty 2

## 2020-02-13 MED ORDER — HYDRALAZINE HCL 20 MG/ML IJ SOLN: 10.0000 mg | INTRAMUSCULAR | Status: DC | PRN

## 2020-02-13 MED ORDER — ACETAMINOPHEN 650 MG RE SUPP: 650.0000 mg | Freq: Four times a day (QID) | RECTAL | Status: DC | PRN

## 2020-02-13 MED ORDER — EPHEDRINE SULFATE-NACL 50-0.9 MG/10ML-% IV SOSY
PREFILLED_SYRINGE | INTRAVENOUS | Status: DC | PRN
  Administered 2020-02-13 (×2): 10 mg via INTRAVENOUS

## 2020-02-13 MED ORDER — TRAMADOL HCL 50 MG PO TABS
50.0000 mg | ORAL_TABLET | Freq: Four times a day (QID) | ORAL | Status: DC | PRN
  Administered 2020-02-14 – 2020-02-15 (×4): 50 mg via ORAL
  Filled 2020-02-13 (×4): qty 1

## 2020-02-13 MED ORDER — CELECOXIB 200 MG PO CAPS
200.0000 mg | ORAL_CAPSULE | ORAL | Status: AC
  Administered 2020-02-13: 200 mg via ORAL
  Filled 2020-02-13: qty 1

## 2020-02-13 MED ORDER — MAGNESIUM HYDROXIDE 400 MG/5ML PO SUSP
30.0000 mL | Freq: Every day | ORAL | Status: DC | PRN
  Administered 2020-02-16 – 2020-02-17 (×2): 30 mL via ORAL
  Filled 2020-02-13 (×2): qty 30

## 2020-02-13 MED ORDER — LABETALOL HCL 5 MG/ML IV SOLN
INTRAVENOUS | Status: DC | PRN
Start: 2020-02-13 — End: 2020-02-13
  Administered 2020-02-13: 5 mg via INTRAVENOUS

## 2020-02-13 MED ORDER — PROPOFOL 10 MG/ML IV BOLUS
INTRAVENOUS | Status: AC
  Filled 2020-02-13: qty 20

## 2020-02-13 MED ORDER — PROPOFOL 10 MG/ML IV BOLUS
INTRAVENOUS | Status: DC | PRN
  Administered 2020-02-13: 170 mg via INTRAVENOUS

## 2020-02-13 MED ORDER — DEXAMETHASONE SODIUM PHOSPHATE 10 MG/ML IJ SOLN
INTRAMUSCULAR | Status: DC | PRN
  Administered 2020-02-13: 8 mg via INTRAVENOUS

## 2020-02-13 MED ORDER — STERILE WATER FOR IRRIGATION IR SOLN
Status: DC | PRN
  Administered 2020-02-13: 1000 mL

## 2020-02-13 MED ORDER — ACETAMINOPHEN 500 MG PO TABS
1000.0000 mg | ORAL_TABLET | Freq: Four times a day (QID) | ORAL | Status: DC
  Administered 2020-02-13 – 2020-02-14 (×4): 1000 mg via ORAL
  Filled 2020-02-13 (×4): qty 2

## 2020-02-13 MED ORDER — LACTATED RINGERS IV SOLN
INTRAVENOUS | Status: DC | PRN
Start: 2020-02-13 — End: 2020-02-13

## 2020-02-13 MED ORDER — OXYCODONE HCL 5 MG PO TABS
5.0000 mg | ORAL_TABLET | ORAL | Status: DC | PRN
  Administered 2020-02-14 – 2020-02-15 (×2): 5 mg via ORAL
  Filled 2020-02-13 (×2): qty 1

## 2020-02-13 MED ORDER — ONDANSETRON 4 MG PO TBDP
4.0000 mg | ORAL_TABLET | Freq: Four times a day (QID) | ORAL | Status: DC | PRN
  Administered 2020-02-14 – 2020-02-15 (×2): 4 mg via ORAL
  Filled 2020-02-13 (×2): qty 1

## 2020-02-13 MED ORDER — MIDAZOLAM HCL 2 MG/2ML IJ SOLN
INTRAMUSCULAR | Status: AC
  Filled 2020-02-13: qty 2

## 2020-02-13 MED ORDER — CEFAZOLIN SODIUM-DEXTROSE 2-4 GM/100ML-% IV SOLN: 2.0000 g | INTRAVENOUS | Status: DC

## 2020-02-13 MED ORDER — METOPROLOL TARTRATE 5 MG/5ML IV SOLN: 5.0000 mg | Freq: Four times a day (QID) | INTRAVENOUS | Status: DC | PRN

## 2020-02-13 MED ORDER — FENTANYL CITRATE (PF) 250 MCG/5ML IJ SOLN
INTRAMUSCULAR | Status: DC | PRN
  Administered 2020-02-13 (×2): 50 ug via INTRAVENOUS
  Administered 2020-02-13: 100 ug via INTRAVENOUS
  Administered 2020-02-13 (×2): 50 ug via INTRAVENOUS

## 2020-02-13 MED ORDER — BUPIVACAINE-EPINEPHRINE 0.5% -1:200000 IJ SOLN
INTRAMUSCULAR | Status: AC
  Filled 2020-02-13: qty 1

## 2020-02-13 MED ORDER — ONDANSETRON HCL 4 MG/2ML IJ SOLN
4.0000 mg | Freq: Four times a day (QID) | INTRAMUSCULAR | Status: DC | PRN
  Administered 2020-02-15: 10:00:00 4 mg via INTRAVENOUS
  Filled 2020-02-13: qty 2

## 2020-02-13 MED ORDER — KCL IN DEXTROSE-NACL 20-5-0.45 MEQ/L-%-% IV SOLN
INTRAVENOUS | Status: DC
  Filled 2020-02-13 (×4): qty 1000

## 2020-02-13 MED ORDER — MEPERIDINE HCL 50 MG/ML IJ SOLN: 6.2500 mg | INTRAMUSCULAR | Status: DC | PRN

## 2020-02-13 MED ORDER — CELECOXIB 200 MG PO CAPS
400.0000 mg | ORAL_CAPSULE | ORAL | Status: DC
  Filled 2020-02-13: qty 2

## 2020-02-13 MED ORDER — LOSARTAN POTASSIUM 25 MG PO TABS
25.0000 mg | ORAL_TABLET | Freq: Every day | ORAL | Status: DC
  Administered 2020-02-13 – 2020-02-21 (×9): 25 mg via ORAL
  Filled 2020-02-13 (×9): qty 1

## 2020-02-13 MED ORDER — PHENYLEPHRINE 40 MCG/ML (10ML) SYRINGE FOR IV PUSH (FOR BLOOD PRESSURE SUPPORT)
PREFILLED_SYRINGE | INTRAVENOUS | Status: DC | PRN
  Administered 2020-02-13: 40 ug via INTRAVENOUS
  Administered 2020-02-13: 60 ug via INTRAVENOUS

## 2020-02-13 MED ORDER — SODIUM CHLORIDE 0.9 % IV SOLN
INTRAVENOUS | Status: DC | PRN
  Administered 2020-02-13: 10 mL

## 2020-02-13 MED ORDER — METHOCARBAMOL 500 MG PO TABS
500.0000 mg | ORAL_TABLET | Freq: Four times a day (QID) | ORAL | Status: DC | PRN
  Administered 2020-02-13 – 2020-02-20 (×9): 500 mg via ORAL
  Filled 2020-02-13 (×10): qty 1

## 2020-02-13 MED ORDER — PHENYLEPHRINE HCL (PRESSORS) 10 MG/ML IV SOLN
INTRAVENOUS | Status: AC
  Filled 2020-02-13: qty 1

## 2020-02-13 MED ORDER — ACETAMINOPHEN 500 MG PO TABS
1000.0000 mg | ORAL_TABLET | ORAL | Status: AC
  Administered 2020-02-13: 1000 mg via ORAL
  Filled 2020-02-13: qty 2

## 2020-02-13 MED ORDER — SIMETHICONE 80 MG PO CHEW
40.0000 mg | CHEWABLE_TABLET | Freq: Four times a day (QID) | ORAL | Status: DC | PRN
  Administered 2020-02-13 – 2020-02-21 (×6): 40 mg via ORAL
  Filled 2020-02-13 (×7): qty 1

## 2020-02-13 MED ORDER — LACTATED RINGERS IR SOLN
Status: DC | PRN
  Administered 2020-02-13: 1000 mL

## 2020-02-13 MED ORDER — LIDOCAINE 2% (20 MG/ML) 5 ML SYRINGE
INTRAMUSCULAR | Status: DC | PRN
  Administered 2020-02-13: 40 mg via INTRAVENOUS

## 2020-02-13 MED ORDER — PANTOPRAZOLE SODIUM 40 MG PO TBEC: 40.0000 mg | DELAYED_RELEASE_TABLET | Freq: Every day | ORAL | Status: DC

## 2020-02-13 MED ORDER — SUGAMMADEX SODIUM 200 MG/2ML IV SOLN
INTRAVENOUS | Status: DC | PRN
  Administered 2020-02-13: 200 mg via INTRAVENOUS

## 2020-02-13 MED ORDER — DROPERIDOL 2.5 MG/ML IJ SOLN
INTRAMUSCULAR | Status: DC | PRN
  Administered 2020-02-13: .625 mg via INTRAVENOUS

## 2020-02-13 MED ORDER — DEXAMETHASONE SODIUM PHOSPHATE 10 MG/ML IJ SOLN
INTRAMUSCULAR | Status: AC
  Filled 2020-02-13: qty 1

## 2020-02-13 MED ORDER — LACTATED RINGERS IV SOLN: INTRAVENOUS | Status: AC

## 2020-02-13 MED ORDER — ACETAMINOPHEN 325 MG PO TABS: 650.0000 mg | ORAL_TABLET | Freq: Four times a day (QID) | ORAL | Status: DC | PRN

## 2020-02-13 MED ORDER — LIDOCAINE 2% (20 MG/ML) 5 ML SYRINGE
INTRAMUSCULAR | Status: AC
  Filled 2020-02-13: qty 5

## 2020-02-13 MED ORDER — PANTOPRAZOLE SODIUM 40 MG PO TBEC
40.0000 mg | DELAYED_RELEASE_TABLET | Freq: Two times a day (BID) | ORAL | Status: DC
  Administered 2020-02-13 – 2020-02-21 (×17): 40 mg via ORAL
  Filled 2020-02-13 (×17): qty 1

## 2020-02-13 MED ORDER — DIPHENHYDRAMINE HCL 50 MG/ML IJ SOLN: 12.5000 mg | Freq: Four times a day (QID) | INTRAMUSCULAR | Status: DC | PRN

## 2020-02-13 MED ORDER — ENSURE PRE-SURGERY PO LIQD: 296.0000 mL | Freq: Once | ORAL | Status: DC

## 2020-02-13 MED ORDER — ONDANSETRON HCL 4 MG/2ML IJ SOLN
INTRAMUSCULAR | Status: AC
  Filled 2020-02-13: qty 2

## 2020-02-13 MED ORDER — SODIUM CHLORIDE 0.9 % IV SOLN: 2.0000 g | INTRAVENOUS | Status: DC

## 2020-02-13 MED ORDER — PHENYLEPHRINE 40 MCG/ML (10ML) SYRINGE FOR IV PUSH (FOR BLOOD PRESSURE SUPPORT)
PREFILLED_SYRINGE | INTRAVENOUS | Status: AC
  Filled 2020-02-13: qty 10

## 2020-02-13 MED ORDER — FENTANYL CITRATE (PF) 100 MCG/2ML IJ SOLN
INTRAMUSCULAR | Status: AC
  Filled 2020-02-13: qty 2

## 2020-02-13 MED ORDER — GABAPENTIN 300 MG PO CAPS
300.0000 mg | ORAL_CAPSULE | Freq: Two times a day (BID) | ORAL | Status: DC
  Administered 2020-02-13 – 2020-02-15 (×3): 300 mg via ORAL
  Filled 2020-02-13 (×4): qty 1

## 2020-02-13 MED ORDER — METRONIDAZOLE IN NACL 5-0.79 MG/ML-% IV SOLN
500.0000 mg | INTRAVENOUS | Status: AC
  Administered 2020-02-13: 500 mg via INTRAVENOUS
  Filled 2020-02-13: qty 100

## 2020-02-13 MED ORDER — BUPIVACAINE-EPINEPHRINE 0.5% -1:200000 IJ SOLN
INTRAMUSCULAR | Status: DC | PRN
  Administered 2020-02-13: 50 mL

## 2020-02-13 MED ORDER — ENOXAPARIN SODIUM 40 MG/0.4ML ~~LOC~~ SOLN
40.0000 mg | SUBCUTANEOUS | Status: DC
  Administered 2020-02-15 – 2020-02-21 (×7): 40 mg via SUBCUTANEOUS
  Filled 2020-02-13 (×7): qty 0.4

## 2020-02-13 MED ORDER — MIDAZOLAM HCL 2 MG/2ML IJ SOLN
INTRAMUSCULAR | Status: DC | PRN
  Administered 2020-02-13: 2 mg via INTRAVENOUS

## 2020-02-13 MED ORDER — BUPIVACAINE LIPOSOME 1.3 % IJ SUSP
INTRAMUSCULAR | Status: DC | PRN
  Administered 2020-02-13: 20 mL

## 2020-02-13 MED ORDER — BISACODYL 10 MG RE SUPP: 10.0000 mg | Freq: Every day | RECTAL | Status: DC | PRN

## 2020-02-13 MED ORDER — ONDANSETRON HCL 4 MG/2ML IJ SOLN
INTRAMUSCULAR | Status: DC | PRN
  Administered 2020-02-13: 4 mg via INTRAVENOUS

## 2020-02-13 MED ORDER — POTASSIUM CHLORIDE CRYS ER 20 MEQ PO TBCR
40.0000 meq | EXTENDED_RELEASE_TABLET | Freq: Two times a day (BID) | ORAL | Status: AC
  Administered 2020-02-13 – 2020-02-15 (×4): 40 meq via ORAL
  Filled 2020-02-13 (×4): qty 2

## 2020-02-13 MED ORDER — SUCRALFATE 1 G PO TABS
1.0000 g | ORAL_TABLET | Freq: Four times a day (QID) | ORAL | Status: DC
  Administered 2020-02-13 – 2020-02-21 (×28): 1 g via ORAL
  Filled 2020-02-13 (×29): qty 1

## 2020-02-13 MED ORDER — LORAZEPAM 0.5 MG PO TABS
0.5000 mg | ORAL_TABLET | Freq: Two times a day (BID) | ORAL | Status: DC | PRN
  Administered 2020-02-15 – 2020-02-20 (×4): 0.5 mg via ORAL
  Filled 2020-02-13 (×5): qty 1

## 2020-02-13 MED ORDER — HYDROMORPHONE HCL 1 MG/ML IJ SOLN
0.2500 mg | INTRAMUSCULAR | Status: DC | PRN
  Administered 2020-02-13: 0.5 mg via INTRAVENOUS

## 2020-02-13 MED ORDER — SCOPOLAMINE 1 MG/3DAYS TD PT72
MEDICATED_PATCH | TRANSDERMAL | Status: DC | PRN
  Administered 2020-02-13: 1 via TRANSDERMAL

## 2020-02-13 MED ORDER — DOCUSATE SODIUM 100 MG PO CAPS
100.0000 mg | ORAL_CAPSULE | Freq: Two times a day (BID) | ORAL | Status: DC
  Administered 2020-02-13 – 2020-02-14 (×3): 100 mg via ORAL
  Filled 2020-02-13 (×3): qty 1

## 2020-02-13 MED ORDER — BUPIVACAINE LIPOSOME 1.3 % IJ SUSP
20.0000 mL | Freq: Once | INTRAMUSCULAR | Status: DC
  Filled 2020-02-13: qty 20

## 2020-02-13 MED ORDER — ROCURONIUM BROMIDE 10 MG/ML (PF) SYRINGE
PREFILLED_SYRINGE | INTRAVENOUS | Status: DC | PRN
  Administered 2020-02-13: 5 mg via INTRAVENOUS
  Administered 2020-02-13: 35 mg via INTRAVENOUS

## 2020-02-13 MED ORDER — SODIUM CHLORIDE 0.9 % IV SOLN: Freq: Three times a day (TID) | INTRAVENOUS | Status: AC | PRN

## 2020-02-13 MED ORDER — SCOPOLAMINE 1 MG/3DAYS TD PT72
MEDICATED_PATCH | TRANSDERMAL | Status: AC
  Filled 2020-02-13: qty 1

## 2020-02-13 MED ORDER — MORPHINE SULFATE (PF) 2 MG/ML IV SOLN: 1.0000 mg | INTRAVENOUS | Status: DC | PRN

## 2020-02-13 MED ORDER — SUCCINYLCHOLINE CHLORIDE 200 MG/10ML IV SOSY
PREFILLED_SYRINGE | INTRAVENOUS | Status: DC | PRN
  Administered 2020-02-13: 120 mg via INTRAVENOUS

## 2020-02-13 MED ORDER — OXYCODONE HCL 5 MG PO TABS: 5.0000 mg | ORAL_TABLET | Freq: Once | ORAL | Status: DC | PRN

## 2020-02-13 MED ORDER — DROPERIDOL 2.5 MG/ML IJ SOLN
INTRAMUSCULAR | Status: AC
  Filled 2020-02-13: qty 2

## 2020-02-13 MED ORDER — NEBIVOLOL HCL 10 MG PO TABS
10.0000 mg | ORAL_TABLET | Freq: Every day | ORAL | Status: DC
  Administered 2020-02-13 – 2020-02-21 (×9): 10 mg via ORAL
  Filled 2020-02-13 (×10): qty 1

## 2020-02-13 MED ORDER — DIPHENHYDRAMINE HCL 12.5 MG/5ML PO ELIX
12.5000 mg | ORAL_SOLUTION | Freq: Four times a day (QID) | ORAL | Status: DC | PRN
  Administered 2020-02-14: 21:00:00 12.5 mg via ORAL
  Filled 2020-02-13: qty 5

## 2020-02-13 MED ORDER — POTASSIUM CHLORIDE 10 MEQ/100ML IV SOLN: 10.0000 meq | INTRAVENOUS | Status: AC

## 2020-02-13 MED ORDER — OXYCODONE HCL 5 MG/5ML PO SOLN: 5.0000 mg | Freq: Once | ORAL | Status: DC | PRN

## 2020-02-13 SURGICAL SUPPLY — 51 items
APL PRP STRL LF DISP 70% ISPRP (MISCELLANEOUS) ×1
APPLIER CLIP 5 13 M/L LIGAMAX5 (MISCELLANEOUS) ×2
APR CLP MED LRG 5 ANG JAW (MISCELLANEOUS) ×1
BAG SPEC RTRVL 10 TROC 200 (ENDOMECHANICALS) ×1
CABLE HIGH FREQUENCY MONO STRZ (ELECTRODE) ×2 IMPLANT
CHLORAPREP W/TINT 26 (MISCELLANEOUS) ×2 IMPLANT
CLIP APPLIE 5 13 M/L LIGAMAX5 (MISCELLANEOUS) ×1 IMPLANT
CNTNR URN SCR LID CUP LEK RST (MISCELLANEOUS) IMPLANT
CONT SPEC 4OZ STRL OR WHT (MISCELLANEOUS) ×2
COVER MAYO STAND STRL (DRAPES) ×2 IMPLANT
COVER SURGICAL LIGHT HANDLE (MISCELLANEOUS) ×2 IMPLANT
COVER WAND RF STERILE (DRAPES) IMPLANT
DECANTER SPIKE VIAL GLASS SM (MISCELLANEOUS) ×2 IMPLANT
DEVICE TROCAR PUNCTURE CLOSURE (ENDOMECHANICALS) ×1 IMPLANT
DRAIN CHANNEL 19F RND (DRAIN) IMPLANT
DRAPE C-ARM 42X120 X-RAY (DRAPES) ×2 IMPLANT
DRAPE WARM FLUID 44X44 (DRAPES) ×2 IMPLANT
DRSG TEGADERM 4X4.75 (GAUZE/BANDAGES/DRESSINGS) ×2 IMPLANT
DRSG TEGADERM 6X8 (GAUZE/BANDAGES/DRESSINGS) ×1 IMPLANT
ELECT REM PT RETURN 15FT ADLT (MISCELLANEOUS) ×2 IMPLANT
ENDOLOOP SUT PDS II  0 18 (SUTURE) ×2
ENDOLOOP SUT PDS II 0 18 (SUTURE) IMPLANT
EVACUATOR SILICONE 100CC (DRAIN) IMPLANT
GAUZE SPONGE 2X2 8PLY STRL LF (GAUZE/BANDAGES/DRESSINGS) ×1 IMPLANT
GLOVE ECLIPSE 8.0 STRL XLNG CF (GLOVE) ×2 IMPLANT
GLOVE INDICATOR 8.0 STRL GRN (GLOVE) ×2 IMPLANT
GOWN STRL REUS W/TWL XL LVL3 (GOWN DISPOSABLE) ×4 IMPLANT
IRRIG SUCT STRYKERFLOW 2 WTIP (MISCELLANEOUS) ×2
IRRIGATION SUCT STRKRFLW 2 WTP (MISCELLANEOUS) ×1 IMPLANT
KIT BASIN OR (CUSTOM PROCEDURE TRAY) ×2 IMPLANT
KIT TURNOVER KIT A (KITS) IMPLANT
NDL BIOPSY 14X6 SOFT TISS (NEEDLE) IMPLANT
NEEDLE BIOPSY 14X6 SOFT TISS (NEEDLE) ×2 IMPLANT
PAD POSITIONING PINK XL (MISCELLANEOUS) ×2 IMPLANT
PENCIL SMOKE EVACUATOR (MISCELLANEOUS) IMPLANT
POUCH RETRIEVAL ECOSAC 10 (ENDOMECHANICALS) IMPLANT
POUCH RETRIEVAL ECOSAC 10MM (ENDOMECHANICALS) ×2
PROTECTOR NERVE ULNAR (MISCELLANEOUS) IMPLANT
SCISSORS LAP 5X35 DISP (ENDOMECHANICALS) ×2 IMPLANT
SET CHOLANGIOGRAPH MIX (MISCELLANEOUS) ×2 IMPLANT
SET TUBE SMOKE EVAC HIGH FLOW (TUBING) ×2 IMPLANT
SHEARS HARMONIC ACE PLUS 36CM (ENDOMECHANICALS) ×2 IMPLANT
SPONGE GAUZE 2X2 STER 10/PKG (GAUZE/BANDAGES/DRESSINGS) ×1
SUT MNCRL AB 4-0 PS2 18 (SUTURE) ×2 IMPLANT
SUT PDS AB 1 CT1 27 (SUTURE) ×5 IMPLANT
SYR 20ML LL LF (SYRINGE) ×2 IMPLANT
TOWEL OR 17X26 10 PK STRL BLUE (TOWEL DISPOSABLE) ×2 IMPLANT
TOWEL OR NON WOVEN STRL DISP B (DISPOSABLE) ×2 IMPLANT
TRAY LAPAROSCOPIC (CUSTOM PROCEDURE TRAY) ×2 IMPLANT
TROCAR BLADELESS OPT 5 100 (ENDOMECHANICALS) ×2 IMPLANT
TROCAR BLADELESS OPT 5 150 (ENDOMECHANICALS) ×2 IMPLANT

## 2020-02-13 NOTE — Anesthesia Preprocedure Evaluation (Signed)
Anesthesia Evaluation  Patient identified by MRN, date of birth, ID band Patient awake    Reviewed: Allergy & Precautions, H&P , NPO status , Patient's Chart, lab work & pertinent test results  History of Anesthesia Complications (+) PONV  Airway Mallampati: II  TM Distance: >3 FB Neck ROM: full    Dental no notable dental hx.    Pulmonary neg pulmonary ROS,    Pulmonary exam normal        Cardiovascular hypertension, Pt. on medications Normal cardiovascular exam     Neuro/Psych Anxiety Depression negative neurological ROS  negative psych ROS   GI/Hepatic negative GI ROS, Neg liver ROS,   Endo/Other  negative endocrine ROS  Renal/GU negative Renal ROS  negative genitourinary   Musculoskeletal negative musculoskeletal ROS (+)   Abdominal Normal abdominal exam  (+) + obese,   Peds negative pediatric ROS (+)  Hematology negative hematology ROS (+)   Anesthesia Other Findings   Reproductive/Obstetrics                             Anesthesia Physical  Anesthesia Plan  ASA: II  Anesthesia Plan: General   Post-op Pain Management:    Induction: Intravenous  PONV Risk Score and Plan: 4 or greater and Ondansetron, Dexamethasone, Midazolam and Droperidol  Airway Management Planned: Oral ETT  Additional Equipment:   Intra-op Plan:   Post-operative Plan: Extubation in OR  Informed Consent: I have reviewed the patients History and Physical, chart, labs and discussed the procedure including the risks, benefits and alternatives for the proposed anesthesia with the patient or authorized representative who has indicated his/her understanding and acceptance.       Plan Discussed with: CRNA, Surgeon and Anesthesiologist  Anesthesia Plan Comments:         Anesthesia Quick Evaluation

## 2020-02-13 NOTE — Anesthesia Procedure Notes (Signed)
Procedure Name: Intubation Date/Time: 02/13/2020 10:34 AM Performed by: Eben Burow, CRNA Pre-anesthesia Checklist: Patient identified, Emergency Drugs available, Suction available, Patient being monitored and Timeout performed Patient Re-evaluated:Patient Re-evaluated prior to induction Oxygen Delivery Method: Circle system utilized Preoxygenation: Pre-oxygenation with 100% oxygen Induction Type: IV induction and Rapid sequence Laryngoscope Size: Mac and 4 Grade View: Grade I Tube type: Oral Tube size: 7.0 mm Number of attempts: 1 Airway Equipment and Method: Stylet Placement Confirmation: ETT inserted through vocal cords under direct vision,  positive ETCO2 and breath sounds checked- equal and bilateral Secured at: 21 cm Tube secured with: Tape Dental Injury: Teeth and Oropharynx as per pre-operative assessment

## 2020-02-13 NOTE — Telephone Encounter (Signed)
Thank you for letting me know! I really hate to hear that she has had to suffer. I will follow along. I am hopeful that she was able to get a bed and some relief from her symptoms. Please keep me posted if you hear anything.

## 2020-02-13 NOTE — Telephone Encounter (Signed)
Thank you for the heads up Dr. Redmond Pulling, Laretta Bolster thank you for working her up aggressively.  We will follow along!  ___________________________________________ [name]. Dianah Field, M.D., ABFM., CAQSM. Primary Care and [place] Instructor of [place] of [facility] of Medicine

## 2020-02-13 NOTE — Consult Note (Signed)
[facility] Gastroenterology Consult  Referring Provider: Dr.Gross/CCs Primary Care Physician:  Silverio Decamp, MD Primary Gastroenterologist: Dr. Stacie Glaze GI  Reason for Consultation: CBD stone  HPI: Melissa Atkinson is a 49 y.o. female had laparoscopic cholecystectomy with intraoperative cholangiogram and cold biopsy of the liver today.  Cholangiogram showed possible CBD stone and GI was consulted for ERCP.  The patient was in her usual state of health until a few days ago when she developed severe epigastric and upper abdominal pain, she was in the ER but after waiting for several hours and improvement in pain she went home.  She was given Prilosec and Carafate as an outpatient, however, as the pain recurred she was seen by her primary care physician and was found to have elevated liver enzymes.  She was scheduled to undergo cholecystectomy next week by Dr. Ninfa Linden and was started on antibiotics however yesterday evening she developed worsening upper abdominal pain and presented to the ER.  No prior endoscopy or colonoscopy. In 2014 she had an episode of dysphagia which was evaluated with a barium swallow which was unremarkable. Patient has lost about 20 pounds since June with dietary modifications, carbohydrate restriction and increase intake of protein. She otherwise denies acid reflux, heartburn, difficulty swallowing, pain on swallowing, change in bowel movements, noticing blood in stool or black stools. There is no family history of colon cancer. She reports having a Cologuard study recently which was unremarkable.  Past Medical History:  Diagnosis Date  . Anxiety   . Back pain   . Bicuspid aortic valve   . Breast cancer ([place])   . Cholelithiasis 01/2020  . Depression   . Joint pain   . Kidney cyst, acquired   . Obesity   . Palpitations   . Polycythemia vera ([place])   . PONV (postoperative nausea and vomiting)    Oct 31 2019 hernia with out problems  . Thoracic aortic  aneurysm ([place])   . Thyroid nodule     Past Surgical History:  Procedure Laterality Date  . DILITATION & CURRETTAGE/HYSTROSCOPY WITH THERMACHOICE ABLATION N/A 12/28/2012   Procedure: DILATATION & CURETTAGE/HYSTEROSCOPY WITH THERMACHOICE ABLATION;  Surgeon: Cyril Mourning, MD;  Location: [facility];  Service: Gynecology;  Laterality: N/A;  . MASTECTOMY     bilateral  . PARTIAL NEPHRECTOMY     RT   . TONSILLECTOMY    . TUBAL LIGATION      Prior to Admission medications   Medication Sig Start Date End Date Taking? Authorizing Provider  BYSTOLIC 10 MG tablet TAKE 1 TABLET (10 MG TOTAL) BY MOUTH DAILY. Patient taking differently: Take 10 mg by mouth daily.  11/12/19  Yes Silverio Decamp, MD  hydrochlorothiazide (MICROZIDE) 12.5 MG capsule Take 1 capsule (12.5 mg total) by mouth daily. 01/07/20 04/06/20 Yes Kilroy, Luke K, PA-C  hydrOXYzine (ATARAX/VISTARIL) 50 MG tablet Take 1 tablet (50 mg total) by mouth at bedtime and may repeat dose one time if needed. For itching. 02/04/20  Yes Early, Coralee Pesa, NP  LORazepam (ATIVAN) 0.5 MG tablet Take 1 tablet (0.5 mg total) by mouth 2 (two) times daily as needed for anxiety. 02/08/20  Yes Early, Coralee Pesa, NP  losartan (COZAAR) 25 MG tablet Take 1 tablet (25 mg total) by mouth daily. 12/15/18 02/13/20 Yes Lelon Perla, MD  pantoprazole (PROTONIX) 40 MG tablet Take 1 tablet (40 mg total) by mouth 2 (two) times daily. 02/08/20  Yes Early, Coralee Pesa, NP  promethazine (PHENERGAN) 25 MG tablet Take 1  tablet (25 mg total) by mouth every 6 (six) hours as needed for nausea or vomiting. 02/08/20  Yes Early, Coralee Pesa, NP  sucralfate (CARAFATE) 1 g tablet Take 1 tablet (1 g total) by mouth 4 (four) times daily. 02/08/20  Yes Early, Coralee Pesa, NP  famotidine (PEPCID) 20 MG tablet  02/07/20   [provider]  silver sulfADIAZINE (SILVADENE) 1 % cream Apply 1 application topically daily. Patient not taking: Reported on 02/13/2020 02/04/20   Early, Coralee Pesa, NP    Current  Facility-Administered Medications  Medication Dose Route Frequency Provider Last Rate Last Admin  . 0.9 %  sodium chloride infusion   Intravenous Q8H PRN Michael Boston, MD      . acetaminophen (TYLENOL) tablet 1,000 mg  1,000 mg Oral Q6H Michael Boston, MD      . bisacodyl (DULCOLAX) suppository 10 mg  10 mg Rectal Daily PRN Michael Boston, MD      . bupivacaine liposome (EXPAREL) 1.3 % injection 266 mg  20 mL Infiltration Once Michael Boston, MD      . cefTRIAXone (ROCEPHIN) 2 g in sodium chloride 0.9 % 100 mL IVPB  2 g Intravenous Q24H Greer Pickerel, MD   Stopped at 02/13/20 [phone]  . dextrose 5 % and 0.45 % NaCl with KCl 20 mEq/L infusion   Intravenous Continuous Greer Pickerel, MD 100 mL/hr at 02/13/20 1220 Rate Verify at 02/13/20 1220  . diphenhydrAMINE (BENADRYL) 12.5 MG/5ML elixir 12.5 mg  12.5 mg Oral Q6H PRN Greer Pickerel, MD       Or  . diphenhydrAMINE (BENADRYL) injection 12.5 mg  12.5 mg Intravenous Q6H PRN Greer Pickerel, MD      . docusate sodium (COLACE) capsule 100 mg  100 mg Oral BID Greer Pickerel, MD      . enoxaparin (LOVENOX) injection 40 mg  40 mg Subcutaneous Q24H Greer Pickerel, MD      . gabapentin (NEURONTIN) capsule 300 mg  300 mg Oral BID Michael Boston, MD      . hydrALAZINE (APRESOLINE) injection 10 mg  10 mg Intravenous Q2H PRN Greer Pickerel, MD      . HYDROmorphone (DILAUDID) 1 MG/ML injection           . lactated ringers infusion   Intravenous Continuous Michael Boston, MD 50 mL/hr at 02/13/20 1349 Restarted at 02/13/20 1349  . LORazepam (ATIVAN) tablet 0.5 mg  0.5 mg Oral BID PRN Michael Boston, MD      . losartan (COZAAR) tablet 25 mg  25 mg Oral Daily Michael Boston, MD   25 mg at 02/13/20 1356  . magnesium hydroxide (MILK OF MAGNESIA) suspension 30 mL  30 mL Oral Daily PRN Michael Boston, MD      . methocarbamol (ROBAXIN) tablet 500 mg  500 mg Oral Q6H PRN Michael Boston, MD      . metoprolol tartrate (LOPRESSOR) injection 5 mg  5 mg Intravenous Q6H PRN Michael Boston, MD      .  morphine 2 MG/ML injection 1-2 mg  1-2 mg Intravenous Q1H PRN Greer Pickerel, MD      . nebivolol (BYSTOLIC) tablet 10 mg  10 mg Oral Daily Michael Boston, MD   10 mg at 02/13/20 1356  . ondansetron (ZOFRAN-ODT) disintegrating tablet 4 mg  4 mg Oral Q6H PRN Greer Pickerel, MD       Or  . ondansetron [facility]) injection 4 mg  4 mg Intravenous Q6H PRN Greer Pickerel, MD      . oxyCODONE (  Oxy IR/ROXICODONE) immediate release tablet 5-10 mg  5-10 mg Oral Q4H PRN Greer Pickerel, MD      . pantoprazole (PROTONIX) EC tablet 40 mg  40 mg Oral BID Michael Boston, MD   40 mg at 02/13/20 1356  . potassium chloride SA (KLOR-CON) CR tablet 40 mEq  40 mEq Oral BID Michael Boston, MD      . simethicone [facility]) chewable tablet 40 mg  40 mg Oral Q6H PRN Greer Pickerel, MD      . sucralfate (CARAFATE) tablet 1 g  1 g Oral QID Michael Boston, MD   1 g at 02/13/20 1402  . traMADol (ULTRAM) tablet 50 mg  50 mg Oral Q6H PRN Michael Boston, MD        Allergies as of 02/12/2020  . (No Known Allergies)    Family History  Problem Relation Age of Onset  . Cancer Mother        lung  . Depression Mother   . Anxiety disorder Mother   . Bipolar disorder Mother   . Alcoholism Mother   . COPD Father   . Congestive Heart Failure Father   . Depression Father   . Alcoholism Father   . Stroke Maternal Grandfather     Social History   Socioeconomic History  . Marital status: Married    Spouse name: Timmothy Sours  . Number of children: 3  . Years of education: Not on file  . Highest education level: Not on file  Occupational History  . Occupation: Programmer, multimedia: [employer]  Tobacco Use  . Smoking status: Never Smoker  . Smokeless tobacco: Never Used  Vaping Use  . Vaping Use: Never used  Substance and Sexual Activity  . Alcohol use: Yes    Comment: Occasional  . Drug use: No  . Sexual activity: Not on file  Other Topics Concern  . Not on file  Social History Narrative  . Not on file   Social Determinants of Health    Financial Resource Strain:   . Difficulty of Paying Living Expenses:   Food Insecurity:   . Worried About Charity fundraiser in the Last Year:   . Arboriculturist in the Last Year:   Transportation Needs:   . Film/video editor (Medical):   Marland Kitchen Lack of Transportation (Non-Medical):   Physical Activity:   . Days of Exercise per Week:   . Minutes of Exercise per Session:   Stress:   . Feeling of Stress :   Social Connections:   . Frequency of Communication with Friends and Family:   . Frequency of Social Gatherings with Friends and Family:   . Attends Religious Services:   . Active Member of Clubs or Organizations:   . Attends Archivist Meetings:   Marland Kitchen Marital Status:   Intimate Partner Violence:   . Fear of Current or Ex-Partner:   . Emotionally Abused:   Marland Kitchen Physically Abused:   . Sexually Abused:     Review of Systems:  GI: Described in detail in HPI.    Gen: Denies any fever, chills, rigors, night sweats, anorexia, fatigue, weakness, malaise, involuntary weight loss, and sleep disorder CV: Denies chest pain, angina, palpitations, syncope, orthopnea, PND, peripheral edema, and claudication. Resp: Denies dyspnea, cough, sputum, wheezing, coughing up blood. GU : Denies urinary burning, blood in urine, urinary frequency, urinary hesitancy, nocturnal urination, and urinary incontinence. MS: Denies joint pain or swelling.  Denies muscle weakness, cramps, atrophy.  Derm: Denies rash, itching, oral ulcerations, hives, unhealing ulcers.  Psych: Denies depression, anxiety, memory loss, suicidal ideation, hallucinations,  and confusion. Heme: Denies bruising, bleeding, and enlarged lymph nodes. Neuro:  Denies any headaches, dizziness, paresthesias. Endo:  Denies any problems with DM, thyroid, adrenal function.  Physical Exam: Vital signs in last 24 hours: Temp:  [97.6 F (36.4 C)-98 F (36.7 C)] 97.6 F (36.4 C) (08/18 1523) Pulse Rate:  [58-72] 70 (08/18  1523) Resp:  [14-19] 18 (08/18 1523) BP: (112-156)/(76-99) 112/78 (08/18 1523) SpO2:  [95 %-100 %] 100 % (08/18 1523) Weight:  [86.1 kg] 86.1 kg (08/18 1328) Last BM Date: 02/12/20  General:   Alert,  Well-developed, well-nourished, pleasant and cooperative in NAD Head:  Normocephalic and atraumatic. Eyes:  Sclera clear, no icterus.   Conjunctiva pink. Ears:  Normal auditory acuity. Nose:  No deformity, discharge,  or lesions. Mouth:  No deformity or lesions.  Oropharynx pink & moist. Neck:  Supple; no masses or thyromegaly. Lungs:  Clear throughout to auscultation.   No wheezes, crackles, or rhonchi. No acute distress. Heart:  Regular rate and rhythm; no murmurs, clicks, rubs,  or gallops. Extremities:  Without clubbing or edema. Neurologic:  Alert and  oriented x4;  grossly normal neurologically. Skin:  Intact without significant lesions or rashes. Psych:  Alert and cooperative. Normal mood and affect. Abdomen: Surgical incisions noted, mild distention, no tenderness, hypoactive bowel sounds        Lab Results: Recent Labs    02/12/20 2246 02/13/20 0717  WBC 9.0 7.0  HGB 13.3 12.6  HCT 40.5 38.4  PLT 163 150   BMET Recent Labs    02/12/20 2246 02/13/20 0717  NA 138 138  K 3.2* 2.9*  CL 97* 102  CO2 28 25  GLUCOSE 122* 119*  BUN 12 8  CREATININE 0.74 0.57  CALCIUM 9.3 9.0   LFT Recent Labs    02/13/20 0717  PROT 6.8  ALBUMIN 3.6  AST 1,106*  ALT 1,070*  ALKPHOS 212*  BILITOT 2.8*   PT/INR No results for input(s): LABPROT, INR in the last 72 hours.  Studies/Results: DG Cholangiogram Operative  Result Date: 02/13/2020 CLINICAL DATA:  49 year old with cholelithiasis. EXAM: INTRAOPERATIVE CHOLANGIOGRAM TECHNIQUE: Cholangiographic images from the C-arm fluoroscopic device were submitted for interpretation post-operatively. Please see the procedural report for the amount of contrast and the fluoroscopy time utilized. COMPARISON:  Ultrasound 02/09/2020  FINDINGS: Contrast opacification of the common bile duct and intrahepatic bile ducts. Mild dilatation of the extrahepatic biliary system. There is no drainage into the duodenum. Mobile filling defects in the biliary system likely represent gas. A small persistent filling defect in the distal common bile duct may represent a small stone. IMPRESSION: Obstruction of the distal common bile duct, likely from a stone. Electronically Signed   By: Markus Daft M.D.   On: 02/13/2020 13:03    Impression: Suspected CBD stone on cholangiogram Elevated liver enzymes, T bili 2.8/AST 1106/ALT 1070/ALP 212 Hepatitis A, B and C studies unremarkable   Plan: On clear liquid diet, patient to remain n.p.o. post midnight for ERCP in a.m. Continue IV ceftriaxone On D5 half-normal saline with 20 M EQ KCl at 100 cc an hour Also on pantoprazole 40 mg twice daily and sucralfate 1 g 4 times a day-I think this can be discontinued. Pain seems to be adequately controlled.  The risks (pancreatitis, bleeding, perforation, anesthesia risks )and the benefits of the procedure were discussed with the patient in details. She  understands and verbalizes consent.   LOS: 0 days   Ronnette Juniper, MD  02/13/2020, 4:19 PM

## 2020-02-13 NOTE — ED Provider Notes (Signed)
[facility] DEPT Provider Note   CSN: [id number] Arrival date & time: 02/12/20  2209     History Chief Complaint  Patient presents with  . Abdominal Pain    Melissa Atkinson is a 49 y.o. female.  The history is provided by the patient.  Abdominal Pain Pain location:  R flank and RUQ Pain quality: cramping   Pain severity:  Severe Onset quality:  Gradual Timing:  Intermittent Progression:  Waxing and waning Chronicity:  New Relieved by:  Nothing Worsened by:  Movement and palpation Associated symptoms: nausea and vomiting   Associated symptoms: no chest pain and no fever   Patient presents for right upper quadrant abdominal pain Patient recently found to have cholelithiasis with abnormal LFTs.  She was recently seen by general surgery and placed on antibiotics.  However his ability to take her medications and has had vomiting.  She reports episodes of right upper quadrant abdominal pain. She called general surgery and was asked to go to the emergency department     Past Medical History:  Diagnosis Date  . Anxiety   . Back pain   . Bicuspid aortic valve   . Breast cancer ([place])   . Depression   . Joint pain   . Kidney cyst, acquired   . Obesity   . Palpitations   . Polycythemia vera ([place])   . Thoracic aortic aneurysm ([place])   . Thyroid nodule     Patient Active Problem List   Diagnosis Date Noted  . Perimenopause 07/25/2019  . Right flank pain 07/25/2019  . Polycythemia vera ([place]) 05/10/2018  . Vitamin D deficiency 05/17/2017  . Depression 05/03/2017  . Class 1 obesity with serious comorbidity and body mass index (BMI) of 32.0 to 32.9 in adult 05/03/2017  . Essential hypertension 08/20/2015  . Thoracic aortic aneurysm ([place]) 08/20/2015  . Major depression, single episode 02/07/2015  . Obesity 02/07/2015  . Annual physical exam 07/12/2014  . Hx of bilateral mastectomy post TRAM flap reconstruction 07/12/2014  . Left sided sciatica  10/29/2013  . Molluscum contagiosum 08/18/2013  . History of palpitations 03/09/2013  . Bicuspid aortic valve 03/09/2013  . Thyroid nodule 03/09/2013  . Breast cancer ([place]) 03/09/2013    Past Surgical History:  Procedure Laterality Date  . DILITATION & CURRETTAGE/HYSTROSCOPY WITH THERMACHOICE ABLATION N/A 12/28/2012   Procedure: DILATATION & CURETTAGE/HYSTEROSCOPY WITH THERMACHOICE ABLATION;  Surgeon: Cyril Mourning, MD;  Location: [facility];  Service: Gynecology;  Laterality: N/A;  . MASTECTOMY     bilateral  . PARTIAL NEPHRECTOMY     RT   . TONSILLECTOMY    . TUBAL LIGATION       OB History    Gravida      Para      Term      Preterm      AB      Living  3     SAB      TAB      Ectopic      Multiple      Live Births           Obstetric Comments  Vaginal deliveries with all children 41', 01' and 03'        Family History  Problem Relation Age of Onset  . Cancer Mother        lung  . Depression Mother   . Anxiety disorder Mother   . Bipolar disorder Mother   . Alcoholism Mother   .  COPD Father   . Congestive Heart Failure Father   . Depression Father   . Alcoholism Father   . Stroke Maternal Grandfather     Social History   Tobacco Use  . Smoking status: Never Smoker  . Smokeless tobacco: Never Used  Vaping Use  . Vaping Use: Never used  Substance Use Topics  . Alcohol use: Yes    Comment: Occasional  . Drug use: No    Home Medications Prior to Admission medications   Medication Sig Start Date End Date Taking? Authorizing Provider  amoxicillin (AMOXIL) 500 MG capsule Take 2 capsules (1,000 mg total) by mouth 2 (two) times daily. 02/08/20   Orma Render, NP  aspirin 81 MG tablet Take 81 mg by mouth daily.     [provider]  BYSTOLIC 10 MG tablet TAKE 1 TABLET (10 MG TOTAL) BY MOUTH DAILY. 11/12/19   Silverio Decamp, MD  clarithromycin (BIAXIN) 500 MG tablet Take 1 tablet (500 mg total) by mouth 2 (two) times daily.  02/08/20   Orma Render, NP  famotidine (PEPCID) 20 MG tablet  02/07/20   [provider]  hydrochlorothiazide (MICROZIDE) 12.5 MG capsule Take 1 capsule (12.5 mg total) by mouth daily. 01/07/20 04/06/20  Erlene Quan, PA-C  hydrOXYzine (ATARAX/VISTARIL) 50 MG tablet Take 1 tablet (50 mg total) by mouth at bedtime and may repeat dose one time if needed. For itching. 02/04/20   Orma Render, NP  ketoconazole (NIZORAL) 2 % cream Apply 1 application topically 2 (two) times daily. To affected areas. 02/08/20   Early, Coralee Pesa, NP  LORazepam (ATIVAN) 0.5 MG tablet Take 1 tablet (0.5 mg total) by mouth 2 (two) times daily as needed for anxiety. 02/08/20   Orma Render, NP  losartan (COZAAR) 25 MG tablet Take 1 tablet (25 mg total) by mouth daily. 12/15/18 02/04/20  Lelon Perla, MD  pantoprazole (PROTONIX) 40 MG tablet Take 1 tablet (40 mg total) by mouth 2 (two) times daily. 02/08/20   Orma Render, NP  promethazine (PHENERGAN) 25 MG tablet Take 1 tablet (25 mg total) by mouth every 6 (six) hours as needed for nausea or vomiting. 02/08/20   Early, Coralee Pesa, NP  silver sulfADIAZINE (SILVADENE) 1 % cream Apply 1 application topically daily. 02/04/20   Orma Render, NP  Simethicone (MYLICON PO)     [provider]  sucralfate (CARAFATE) 1 g tablet Take 1 tablet (1 g total) by mouth 4 (four) times daily. 02/08/20   Orma Render, NP  triamcinolone cream (KENALOG) 0.1 % APPLY TOPICALLY 2 TIMES DAILY. 04/17/19   Silverio Decamp, MD    Allergies    Patient has no known allergies.  Review of Systems   Review of Systems  Constitutional: Negative for fever.  Cardiovascular: Negative for chest pain.  Gastrointestinal: Positive for abdominal pain, nausea and vomiting.  All other systems reviewed and are negative.   Physical Exam Updated Vital Signs BP 137/89   Pulse 71   Temp 97.7 F (36.5 C)   Resp 18   SpO2 100%   Physical Exam CONSTITUTIONAL: Well developed/well  nourished HEAD: Normocephalic/atraumatic EYES: EOMI ENMT: Mask in place NECK: supple no meningeal signs SPINE/BACK:entire spine nontender CV: S1/S2 noted, no murmurs/rubs/gallops noted LUNGS: Lungs are clear to auscultation bilaterally, no apparent distress ABDOMEN: soft, mild RUQ tenderness, no rebound or guarding, bowel sounds noted throughout abdomen GU:no cva tenderness NEURO: Pt is awake/alert/appropriate, moves all extremitiesx4.  No facial droop.   EXTREMITIES: pulses normal/equal, full ROM SKIN: warm, color normal PSYCH: no abnormalities of mood noted, alert and oriented to situation  ED Results / Procedures / Treatments   Labs (all labs ordered are listed, but only abnormal results are displayed) Labs Reviewed  COMPREHENSIVE METABOLIC PANEL - Abnormal; Notable for the following components:      Result Value   Potassium 3.2 (*)    Chloride 97 (*)    Glucose, Bld 122 (*)    AST 855 (*)    ALT 776 (*)    Alkaline Phosphatase 220 (*)    Total Bilirubin 2.3 (*)    All other components within normal limits  URINALYSIS, ROUTINE W REFLEX MICROSCOPIC - Abnormal; Notable for the following components:   Hgb urine dipstick SMALL (*)    All other components within normal limits  SARS CORONAVIRUS 2 BY RT PCR (HOSPITAL ORDER, [facility])  LIPASE, BLOOD  CBC  HEPATITIS PANEL, ACUTE  I-STAT BETA HCG BLOOD, ED ([facility], [facility], [facility] ONLY)    EKG None  Radiology No results found.  Procedures Procedures  Medications Ordered in ED Medications - No data to display  ED Course  I have reviewed the triage vital signs and the nursing notes.  Pertinent labs  results that were available during my care of the patient were reviewed by me and considered in my medical decision making (see chart for details).    MDM Rules/Calculators/A&P                         4:20 AM Patient had recent ultrasound on August 14 that revealed cholelithiasis and wall thickening.  She  was just seen by general surgery and placed on antibiotics with anticipation of cholecystectomy on August 23 Due to persistent pain and vomiting unable to take antibiotics, she was advised to go to the ER. Patient is currently pain-free.  Discussed with Dr. Redmond Pulling with general surgery.  Plan to admit to the hospital. Patient otherwise stable at this time Final Clinical Impression(s) / ED Diagnoses Final diagnoses:  Cholecystitis  Hepatitis    Rx / DC Orders ED Discharge Orders    None       Ripley Fraise, MD 02/13/20 [phone]

## 2020-02-13 NOTE — H&P (Signed)
[facility] Admission Note  Melissa Atkinson 23-Nov-1970  [id number].    Requesting MD: Ripley Fraise Chief Complaint: Right upper quadrant abdominal pain, nausea and vomiting Reason for Consult: Cholecystitis/cholelithiasis  HPI:  Patient is a 49 year old female with a history of abdominal pain and was seen by her PCP on 02/09/2020.  She had cholelithiasis and possible cholecystitis on ultrasound, she also had elevated LFTs.  She was seen yesterday by Dr. Ninfa Linden in the clinic and placed on antibiotics, and scheduled for surgery next week.  Since going home she had ongoing abdominal pain nausea and vomiting was referred to the ED.  Work-up in the ED last evening shows sodium 138, potassium of 3.2, chloride of 97, glucose of 122.  Creatinine 0.74, alk phos 220, lipase 31, AST 855, ALT 776, total bilirubin 2.3.  WBC 9.0, hemoglobin 13.3, hematocrit 40.5, platelets 163,000.  Hepatitis panel is negative.  Covid is negative.   Abdominal ultrasound on 02/09/2020, shows a contracted gallbladder with stones and wall thickening.  Tenderness to gallbladder palpation without pericholecystic fluid findings suspicious for chronic cholecystitis a component of superimposed inflammation cannot be occluded.  Patient was seen in the ED and is being admitted for IV antibiotics, IV fluid hydration and further work-up.  Currently she is pain free.  Hx of bilateral mastectomy and abdominal surgery 2003 for breast cancer, now s/p Ventral hernia repair 10/31/19.    ROS:  Review of Systems  Constitutional: Positive for weight loss (she has been dieting, but not related to new pain).  HENT: Negative.   Eyes: Negative.   Respiratory: Negative.   Cardiovascular: Negative.   Gastrointestinal: Positive for abdominal pain, heartburn, nausea and vomiting. Negative for blood in stool, constipation, diarrhea and melena.  Genitourinary: Negative.   Musculoskeletal: Negative.   Skin: Negative.   Neurological:  Negative.   Endo/Heme/Allergies: Negative.   Psychiatric/Behavioral: Negative.      Family History  Problem Relation Age of Onset  . Cancer Mother        lung  . Depression Mother   . Anxiety disorder Mother   . Bipolar disorder Mother   . Alcoholism Mother   . COPD Father   . Congestive Heart Failure Father   . Depression Father   . Alcoholism Father   . Stroke Maternal Grandfather     Past Medical History:  Diagnosis Date  . Anxiety   . Back pain   . Bicuspid aortic valve   . Breast cancer ([place])   . Depression   . Joint pain   . Kidney cyst, acquired   . Obesity   . Palpitations   . Polycythemia vera ([place])   . Thoracic aortic aneurysm ([place])   . Thyroid nodule     Past Surgical History:  Procedure Laterality Date  . DILITATION & CURRETTAGE/HYSTROSCOPY WITH THERMACHOICE ABLATION N/A 12/28/2012   Procedure: DILATATION & CURETTAGE/HYSTEROSCOPY WITH THERMACHOICE ABLATION;  Surgeon: Cyril Mourning, MD;  Location: [facility];  Service: Gynecology;  Laterality: N/A;  . MASTECTOMY     bilateral  . PARTIAL NEPHRECTOMY     RT   . TONSILLECTOMY    . TUBAL LIGATION      Social History:  reports that she has never smoked. She has never used smokeless tobacco. She reports current alcohol use. She reports that she does not use drugs.  Allergies: No Known Allergies  Prior to Admission medications   Medication Sig Start Date End Date Taking? Authorizing Provider  BYSTOLIC 10 MG tablet TAKE  1 TABLET (10 MG TOTAL) BY MOUTH DAILY. Patient taking differently: Take 10 mg by mouth daily.  11/12/19  Yes Silverio Decamp, MD  hydrochlorothiazide (MICROZIDE) 12.5 MG capsule Take 1 capsule (12.5 mg total) by mouth daily. 01/07/20 04/06/20 Yes Kilroy, Luke K, PA-C  hydrOXYzine (ATARAX/VISTARIL) 50 MG tablet Take 1 tablet (50 mg total) by mouth at bedtime and may repeat dose one time if needed. For itching. 02/04/20  Yes Early, Coralee Pesa, NP  LORazepam (ATIVAN) 0.5 MG tablet Take 1 tablet  (0.5 mg total) by mouth 2 (two) times daily as needed for anxiety. 02/08/20  Yes Early, Coralee Pesa, NP  losartan (COZAAR) 25 MG tablet Take 1 tablet (25 mg total) by mouth daily. 12/15/18 02/13/20 Yes Lelon Perla, MD  pantoprazole (PROTONIX) 40 MG tablet Take 1 tablet (40 mg total) by mouth 2 (two) times daily. 02/08/20  Yes Early, Coralee Pesa, NP  promethazine (PHENERGAN) 25 MG tablet Take 1 tablet (25 mg total) by mouth every 6 (six) hours as needed for nausea or vomiting. 02/08/20  Yes Early, Coralee Pesa, NP  sucralfate (CARAFATE) 1 g tablet Take 1 tablet (1 g total) by mouth 4 (four) times daily. 02/08/20  Yes Early, Coralee Pesa, NP  amoxicillin (AMOXIL) 500 MG capsule Take 2 capsules (1,000 mg total) by mouth 2 (two) times daily. 02/08/20   Orma Render, NP  famotidine (PEPCID) 20 MG tablet  02/07/20   [provider]  silver sulfADIAZINE (SILVADENE) 1 % cream Apply 1 application topically daily. Patient not taking: Reported on 02/13/2020 02/04/20   Orma Render, NP     Blood pressure (!) 147/80, pulse 63, temperature 97.7 F (36.5 C), resp. rate 16, SpO2 98 %. Physical Exam:  General: pleasant, WD, WN white female who is laying in bed in NAD HEENT: head is normocephalic, atraumatic.  Sclera are noninjected.  Pupils are equal.  Ears and nose without any masses or lesions.  Mouth is pink and moist Heart: regular, rate, and rhythm.  Normal s1,s2. No obvious murmurs, gallops, or rubs noted.  Palpable radial and pedal pulses bilaterally Lungs: CTAB, no wheezes, rhonchi, or rales noted.  Respiratory effort nonlabored Abd: soft, NT, ND, she has an umbilical scar and a lower transverse abdominal scar +BS, no masses, hernias, or organomegaly MS: all 4 extremities are symmetrical with no cyanosis, clubbing, or edema. Skin: warm and dry with no masses, lesions, or rashes Neuro: Cranial nerves 2-12 grossly intact, sensation is normal throughout Psych: A&Ox3 with an appropriate affect.   Results for orders  placed or performed during the hospital encounter of 02/13/20 (from the past 48 hour(s))  Lipase, blood     Status: None   Collection Time: 02/12/20 10:46 PM  Result Value Ref Range   Lipase 31 11 - 51 U/L    Comment: Performed at [facility], [place] [address] [place], [place] [address]  Comprehensive metabolic panel     Status: Abnormal   Collection Time: 02/12/20 10:46 PM  Result Value Ref Range   Sodium 138 135 - 145 mmol/L   Potassium 3.2 (L) 3.5 - 5.1 mmol/L   Chloride 97 (L) 98 - 111 mmol/L   CO2 28 22 - 32 mmol/L   Glucose, Bld 122 (H) 70 - 99 mg/dL    Comment: Glucose reference range applies only to samples taken after fasting for at least 8 hours.   BUN 12 6 - 20 mg/dL   Creatinine, Ser 0.74 0.44 -  1.00 mg/dL   Calcium 9.3 8.9 - 10.3 mg/dL   Total Protein 7.4 6.5 - 8.1 g/dL   Albumin 4.4 3.5 - 5.0 g/dL   AST 855 (H) 15 - 41 U/L   ALT 776 (H) 0 - 44 U/L   Alkaline Phosphatase 220 (H) 38 - 126 U/L   Total Bilirubin 2.3 (H) 0.3 - 1.2 mg/dL   GFR calc non Af Amer >60 >60 mL/min   GFR calc Af Amer >60 >60 mL/min   Anion gap 13 5 - 15    Comment: Performed at [facility], [place] [address] [place], [place] [address]  CBC     Status: None   Collection Time: 02/12/20 10:46 PM  Result Value Ref Range   WBC 9.0 4.0 - 10.5 K/uL   RBC 4.58 3.87 - 5.11 MIL/uL   Hemoglobin 13.3 12.0 - 15.0 g/dL   HCT 40.5 36 - 46 %   MCV 88.4 80.0 - 100.0 fL   MCH 29.0 26.0 - 34.0 pg   MCHC 32.8 30.0 - 36.0 g/dL   RDW 13.3 11.5 - 15.5 %   Platelets 163 150 - 400 K/uL   nRBC 0.0 0.0 - 0.2 %    Comment: Performed at [facility]: [place], [place] [address], [place], [place] [address]  Urinalysis, Routine w reflex microscopic Urine, Clean Catch     Status: Abnormal   Collection Time: 02/12/20 10:46 PM  Result Value Ref Range   Color, Urine YELLOW YELLOW   APPearance CLEAR CLEAR   Specific Gravity, Urine 1.009 1.005 - 1.030   pH 8.0 5.0 - 8.0    Glucose, UA NEGATIVE NEGATIVE mg/dL   Hgb urine dipstick SMALL (A) NEGATIVE   Bilirubin Urine NEGATIVE NEGATIVE   Ketones, ur NEGATIVE NEGATIVE mg/dL   Protein, ur NEGATIVE NEGATIVE mg/dL   Nitrite NEGATIVE NEGATIVE   Leukocytes,Ua NEGATIVE NEGATIVE   RBC / HPF 0-5 0 - 5 RBC/hpf   WBC, UA 0-5 0 - 5 WBC/hpf   Bacteria, UA NONE SEEN NONE SEEN    Comment: Performed at [facility], [place] [address] [place], [place] [address]  Hepatitis panel, acute     Status: None   Collection Time: 02/12/20 10:46 PM  Result Value Ref Range   Hepatitis B Surface Ag NON REACTIVE NON REACTIVE   HCV Ab NON REACTIVE NON REACTIVE    Comment: (NOTE) Nonreactive HCV antibody screen is consistent with no HCV infections,  unless recent infection is suspected or other evidence exists to indicate HCV infection.     Hep A IgM NON REACTIVE NON REACTIVE   Hep B C IgM NON REACTIVE NON REACTIVE    Comment: Performed at [facility], [place] [address] [place],  [address]  I-Stat beta hCG blood, ED     Status: None   Collection Time: 02/12/20 11:05 PM  Result Value Ref Range   I-stat hCG, quantitative <5.0 <5 mIU/mL   Comment 3            Comment:   GEST. AGE      CONC.  (mIU/mL)   <=1 WEEK        5 - 50     2 WEEKS       50 - 500     3 WEEKS       100 - 10,000     4 WEEKS     1,000 - 30,000  FEMALE AND NON-PREGNANT FEMALE:     LESS THAN 5 mIU/mL   SARS Coronavirus 2 by RT PCR (hospital order, performed in [facility] hospital lab) Nasopharyngeal Nasopharyngeal Swab     Status: None   Collection Time: 02/13/20  4:49 AM   Specimen: Nasopharyngeal Swab  Result Value Ref Range   SARS Coronavirus 2 NEGATIVE NEGATIVE    Comment: (NOTE) SARS-CoV-2 target nucleic acids are NOT DETECTED.  The SARS-CoV-2 RNA is generally detectable in upper and lower respiratory specimens during the acute phase of infection. The lowest concentration of SARS-CoV-2 viral copies this assay can detect  is 250 copies / mL. A negative result does not preclude SARS-CoV-2 infection and should not be used as the sole basis for treatment or other patient management decisions.  A negative result may occur with improper specimen collection / handling, submission of specimen other than nasopharyngeal swab, presence of viral mutation(s) within the areas targeted by this assay, and inadequate number of viral copies (<250 copies / mL). A negative result must be combined with clinical observations, patient history, and epidemiological information.  Fact Sheet for Patients:   StrictlyIdeas.no  Fact Sheet for Healthcare Providers: BankingDealers.co.za  This test is not yet approved or  cleared by the [place] FDA and has been authorized for detection and/or diagnosis of SARS-CoV-2 by FDA under an Emergency Use Authorization (EUA).  This EUA will remain in effect (meaning this test can be used) for the duration of the COVID-19 declaration under Section 564(b)(1) of the Act, 21 U.S.C. section 360bbb-3(b)(1), unless the authorization is terminated or revoked sooner.  Performed at [facility], [place] [address], [place], [place] [address]    No results found.    Assessment/Plan Hx DCIS with bilateral mastectomies/abdominal surgery 2003 S/p ventral hernia repair 10/31/2019 -TRAM flap Hx polycythemia vera/s/p partial right nephrectomy 1986 Bicuspid aortic valve Hypertension Thoracic aortic enlargement HX palpitations   Right upper quadrant pain/right flank pain, nausea and vomiting Elevated LFTs Cholelithiasis  FEN: N.p.o./IV fluids ID: Rocephin 8/18 >> day 1 DVT: SCDs Follow-up: [facility]  Plan: Patient admitted last evening placed on IV antibiotics.  Her nausea and vomiting has resolved.  Her pain is also resolved.  She was seen by Dr. Clyda Greener opinion she should undergo laparoscopic cholecystectomy with IOC.  This  was discussed with the patient and her husband in detail and they are in agreement.   [place], [facility] 02/13/2020, 7:00 AM Please see Amion for pager number during day hours 7:00am-4:30pm

## 2020-02-13 NOTE — Interval H&P Note (Signed)
History and Physical Interval Note:  02/13/2020 10:05 AM  Melissa Atkinson  has presented today for surgery, with the diagnosis of Cholecystitis.  The various methods of treatment have been discussed with the patient and family. After consideration of risks, benefits and other options for treatment, the patient has consented to  Procedure(s): LAPAROSCOPIC CHOLECYSTECTOMY SINGLE SITE (N/A) as a surgical intervention.  The patient's history has been reviewed, patient examined, no change in status, stable for surgery.  I have reviewed the patient's chart and labs.  Questions were answered to the patient's satisfaction.    The anatomy & physiology of hepatobiliary & pancreatic function was discussed.  The pathophysiology of gallbladder dysfunction was discussed.  Natural history risks without surgery was discussed.   I feel the risks of no intervention will lead to serious problems that outweigh the operative risks; therefore, I recommended cholecystectomy to remove the pathology.  I explained laparoscopic techniques with possible need for an open approach.  Probable cholangiogram to evaluate the bilary tract was explained as well.    Risks such as bleeding, infection, diarrhea and other bowel changes, abscess, leak, injury to other organs, need for repair of tissues / organs, need for further treatment, stroke, heart attack, death, and other risks were discussed.  I noted a good likelihood this will help address the problem, but there is a chance it may not help.  Possibility that this will not correct all abdominal symptoms was explained.  Goals of post-operative recovery were discussed as well.  We will work to minimize complications.  An educational handout further explaining the pathology and treatment options was given as well.  Questions were answered.  The patient expresses understanding & wishes to proceed with surgery.  I have re-reviewed the the patient's records, history, medications, and allergies.   I have re-examined the patient.  I again discussed intraoperative plans and goals of post-operative recovery.  The patient agrees to proceed.  Melissa Atkinson  Oct 05, 1970 [id number]  Patient Care Team: Monica Becton, MD as PCP - General (Family Medicine) Lewayne Bunting, MD as PCP - Cardiology (Cardiology)  Patient Active Problem List   Diagnosis Date Noted   Cholecystitis 02/13/2020   LFT elevation 02/13/2020   Hypokalemia 02/13/2020   Incisional suprapubic hernia s/p open repair w mesh 10/2019 02/13/2020   Ventral hernia without obstruction or gangrene 09/20/2019   Perimenopause 07/25/2019   Right flank pain 07/25/2019   Polycythemia vera (HCC) 05/10/2018   Vitamin D deficiency 05/17/2017   Depression 05/03/2017   Class 1 obesity with serious comorbidity and body mass index (BMI) of 32.0 to 32.9 in adult 05/03/2017   Essential hypertension 08/20/2015   Thoracic aortic aneurysm (HCC) 08/20/2015   Major depression, single episode 02/07/2015   Obesity 02/07/2015   Annual physical exam 07/12/2014   Hx of bilateral mastectomy post TRAM flap reconstruction 07/12/2014   Left sided sciatica 10/29/2013   Molluscum contagiosum 08/18/2013   History of palpitations 03/09/2013   Bicuspid aortic valve 03/09/2013   Thyroid nodule 03/09/2013   Breast cancer (HCC) 03/09/2013    Past Medical History:  Diagnosis Date   Anxiety    Back pain    Bicuspid aortic valve    Breast cancer (HCC)    Cholelithiasis 01/2020   Depression    Joint pain    Kidney cyst, acquired    Obesity    Palpitations    Polycythemia vera (HCC)    PONV (postoperative nausea and vomiting)    Oct 31 2019 hernia with out problems   Thoracic aortic aneurysm [facility])    Thyroid nodule     Past Surgical History:  Procedure Laterality Date   DILITATION & CURRETTAGE/HYSTROSCOPY WITH THERMACHOICE ABLATION N/A 12/28/2012   Procedure: DILATATION & CURETTAGE/HYSTEROSCOPY WITH THERMACHOICE ABLATION;  Surgeon:  Jeani Hawking, MD;  Location: [facility];  Service: Gynecology;  Laterality: N/A;   MASTECTOMY     bilateral   PARTIAL NEPHRECTOMY     RT    TONSILLECTOMY     TUBAL LIGATION      Social History   Socioeconomic History   Marital status: Married    Spouse name: Don   Number of children: 3   Years of education: Not on file   Highest education level: Not on file  Occupational History   Occupation: Teacher, 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[employer]  Tobacco Use   Smoking status: Never Smoker   Smokeless tobacco: Never Used  Building services engineer Use: Never used  Substance and Sexual Activity   Alcohol use: Yes    Comment: Occasional   Drug use: No   Sexual activity: Not on file  Other Topics Concern   Not on file  Social History Narrative   Not on file   Social Determinants of Health   Financial Resource Strain:    Difficulty of Paying Living Expenses:   Food Insecurity:    Worried About Programme researcher, broadcasting/film/video in the Last Year:    Barista in the Last Year:   Transportation Needs:    Freight forwarder (Medical):    Lack of Transportation (Non-Medical):   Physical Activity:    Days of Exercise per Week:    Minutes of Exercise per Session:   Stress:    Feeling of Stress :   Social Connections:    Frequency of Communication with Friends and Family:    Frequency of Social Gatherings with Friends and Family:    Attends Religious Services:    Active Member of Clubs or Organizations:    Attends Engineer, structural:    Marital Status:   Intimate Partner Violence:    Fear of Current or Ex-Partner:    Emotionally Abused:    Physically Abused:    Sexually Abused:     Family History  Problem Relation Age of Onset   Cancer Mother        lung   Depression Mother    Anxiety disorder Mother    Bipolar disorder Mother    Alcoholism Mother    COPD Father    Congestive Heart Failure Father    Depression Father    Alcoholism Father    Stroke Maternal Grandfather      Medications Prior to Admission  Medication Sig Dispense Refill Last Dose   BYSTOLIC 10 MG tablet TAKE 1 TABLET (10 MG TOTAL) BY MOUTH DAILY. (Patient taking differently: Take 10 mg by mouth daily. ) 90 tablet 3 02/11/2020 at Unknown time   hydrochlorothiazide (MICROZIDE) 12.5 MG capsule Take 1 capsule (12.5 mg total) by mouth daily. 90 capsule 3 02/12/2020 at Unknown time   hydrOXYzine (ATARAX/VISTARIL) 50 MG tablet Take 1 tablet (50 mg total) by mouth at bedtime and may repeat dose one time if needed. For itching. 60 tablet 3 02/12/2020 at Unknown time   LORazepam (ATIVAN) 0.5 MG tablet Take 1 tablet (0.5 mg total) by mouth 2 (two) times daily as needed for anxiety. 30 tablet 0 02/12/2020 at Unknown  time   losartan (COZAAR) 25 MG tablet Take 1 tablet (25 mg total) by mouth daily. 90 tablet 3 02/12/2020 at Unknown time   pantoprazole (PROTONIX) 40 MG tablet Take 1 tablet (40 mg total) by mouth 2 (two) times daily. 60 tablet 3 02/12/2020 at Unknown time   promethazine (PHENERGAN) 25 MG tablet Take 1 tablet (25 mg total) by mouth every 6 (six) hours as needed for nausea or vomiting. 30 tablet 3 02/12/2020 at Unknown time   sucralfate (CARAFATE) 1 g tablet Take 1 tablet (1 g total) by mouth 4 (four) times daily. 120 tablet 2 02/12/2020 at Unknown time   amoxicillin (AMOXIL) 500 MG capsule Take 2 capsules (1,000 mg total) by mouth 2 (two) times daily. 54 capsule 0 not started   famotidine (PEPCID) 20 MG tablet       silver sulfADIAZINE (SILVADENE) 1 % cream Apply 1 application topically daily. (Patient not taking: Reported on 02/13/2020) 50 g 1 Completed Course at Unknown time    Current Facility-Administered Medications  Medication Dose Route Frequency Provider Last Rate Last Admin   [MAR Hold] acetaminophen (TYLENOL) tablet 650 mg  650 mg Oral Q6H PRN Gaynelle Adu, MD       Or   Mitzi Hansen Hold] acetaminophen (TYLENOL) suppository 650 mg  650 mg Rectal Q6H PRN Gaynelle Adu, MD       [facility] Hold] bupivacaine  liposome (EXPAREL) 1.3 % injection 266 mg  20 mL Infiltration Once Karie Soda, MD       [facility] Hold] cefTRIAXone (ROCEPHIN) 2 g in sodium chloride 0.9 % 100 mL IVPB  2 g Intravenous Q24H Gaynelle Adu, MD   Stopped at 02/13/20 [phone]   cefTRIAXone (ROCEPHIN) 2 g in sodium chloride 0.9 % 100 mL IVPB  2 g Intravenous On Call to OR Karie Soda, MD       And   metroNIDAZOLE (FLAGYL) IVPB 500 mg  500 mg Intravenous On Call to OR Karie Soda, MD       Chlorhexidine Gluconate Cloth 2 % PADS 6 each  6 each Topical Once Abigail Miyamoto, MD       And   Chlorhexidine Gluconate Cloth 2 % PADS 6 each  6 each Topical Once Abigail Miyamoto, MD       dextrose 5 % and 0.45 % NaCl with KCl 20 mEq/L infusion   Intravenous Continuous Gaynelle Adu, MD 100 mL/hr at 02/13/20 0744 New Bag at 02/13/20 0744   [MAR Hold] diphenhydrAMINE (BENADRYL) 12.5 MG/5ML elixir 12.5 mg  12.5 mg Oral Q6H PRN Gaynelle Adu, MD       Or   Mitzi Hansen Hold] diphenhydrAMINE (BENADRYL) injection 12.5 mg  12.5 mg Intravenous Q6H PRN Gaynelle Adu, MD       Mitzi Hansen Hold] docusate sodium (COLACE) capsule 100 mg  100 mg Oral BID Gaynelle Adu, MD       [facility] Hold] enoxaparin (LOVENOX) injection 40 mg  40 mg Subcutaneous Q24H Gaynelle Adu, MD       [START ON 02/14/2020] feeding supplement (ENSURE PRE-SURGERY) liquid 296 mL  296 mL Oral Once Abigail Miyamoto, MD       [facility] Hold] hydrALAZINE (APRESOLINE) injection 10 mg  10 mg Intravenous Q2H PRN Gaynelle Adu, MD       [facility] Hold] morphine 2 MG/ML injection 1-2 mg  1-2 mg Intravenous Q1H PRN Gaynelle Adu, MD       Mitzi Hansen Hold] ondansetron (ZOFRAN-ODT) disintegrating tablet 4 mg  4 mg Oral Q6H PRN Gaynelle Adu, MD  Or   [MAR Hold] ondansetron (ZOFRAN) injection 4 mg  4 mg Intravenous Q6H PRN Gaynelle Adu, MD       [facility] Hold] oxyCODONE (Oxy IR/ROXICODONE) immediate release tablet 5-10 mg  5-10 mg Oral Q4H PRN Gaynelle Adu, MD       Mitz[facility] Hansen Hold] pantoprazole (PROTONIX) EC tablet 40 mg  40 mg Oral Daily  Gaynelle Adu, MD       [facility] Hold] potassium chloride 10 mEq in 100 mL IVPB  10 mEq Intravenous Q1 Hr x 4 Sabiha Sura, Viviann Spare, MD       Mitzi Hansen Hold] potassium chloride SA (KLOR-CON) CR tablet 40 mEq  40 mEq Oral BID Karie Soda, MD       Mitzi Hansen Hold] simethicone [facility]) chewable tablet 40 mg  40 mg Oral Q6H PRN Gaynelle Adu, MD         No Known Allergies  BP 134/79   Pulse (!) 58   Temp 97.9 F (36.6 C) (Oral)   Resp 16   SpO2 99%   Labs: Results for orders placed or performed during the hospital encounter of 02/13/20 (from the past 48 hour(s))  Lipase, blood     Status: None   Collection Time: 02/12/20 10:46 PM  Result Value Ref Range   Lipase 31 11 - 51 U/L    Comment: Performed at [facility], [address], [place], [place] [address]  Comprehensive metabolic panel     Status: Abnormal   Collection Time: 02/12/20 10:46 PM  Result Value Ref Range   Sodium 138 135 - 145 mmol/L   Potassium 3.2 (L) 3.5 - 5.1 mmol/L   Chloride 97 (L) 98 - 111 mmol/L   CO2 28 22 - 32 mmol/L   Glucose, Bld 122 (H) 70 - 99 mg/dL    Comment: Glucose reference range applies only to samples taken after fasting for at least 8 hours.   BUN 12 6 - 20 mg/dL   Creatinine, Ser 4.40 0.44 - 1.00 mg/dL   Calcium 9.3 8.9 - 10.2 mg/dL   Total Protein 7.4 6.5 - 8.1 g/dL   Albumin 4.4 3.5 - 5.0 g/dL   AST 725 (H) 15 - 41 U/L   ALT 776 (H) 0 - 44 U/L   Alkaline Phosphatase 220 (H) 38 - 126 U/L   Total Bilirubin 2.3 (H) 0.3 - 1.2 mg/dL   GFR calc non Af Amer >60 >60 mL/min   GFR calc Af Amer >60 >60 mL/min   Anion gap 13 5 - 15    Comment: Performed at [facility], [address], [place], [place] [address]  CBC     Status: None   Collection Time: 02/12/20 10:46 PM  Result Value Ref Range   WBC 9.0 4.0 - 10.5 K/uL   RBC 4.58 3.87 - 5.11 MIL/uL   Hemoglobin 13.3 12.0 - 15.0 g/dL   HCT 03.4 36 - 46 %   MCV 88.4 80.0 - 100.0 fL   MCH 29.0 26.0 - 34.0 pg   MCHC 32.8 30.0 -  36.0 g/dL   RDW 74.2 59.5 - 63.8 %   Platelets 163 150 - 400 K/uL   nRBC 0.0 0.0 - 0.2 %    Comment: Performed at [facility], [address], [place], [place] [address]  Urinalysis, Routine w reflex microscopic Urine, Clean Catch     Status: Abnormal   Collection Time: 02/12/20 10:46 PM  Result Value Ref Range   Color, Urine  YELLOW YELLOW   APPearance CLEAR CLEAR   Specific Gravity, Urine 1.009 1.005 - 1.030   pH 8.0 5.0 - 8.0   Glucose, UA NEGATIVE NEGATIVE mg/dL   Hgb urine dipstick SMALL (A) NEGATIVE   Bilirubin Urine NEGATIVE NEGATIVE   Ketones, ur NEGATIVE NEGATIVE mg/dL   Protein, ur NEGATIVE NEGATIVE mg/dL   Nitrite NEGATIVE NEGATIVE   Leukocytes,Ua NEGATIVE NEGATIVE   RBC / HPF 0-5 0 - 5 RBC/hpf   WBC, UA 0-5 0 - 5 WBC/hpf   Bacteria, UA NONE SEEN NONE SEEN    Comment: Performed at [facility], [address], [place], [place] [address]  Hepatitis panel, acute     Status: None   Collection Time: 02/12/20 10:46 PM  Result Value Ref Range   Hepatitis B Surface Ag NON REACTIVE NON REACTIVE   HCV Ab NON REACTIVE NON REACTIVE    Comment: (NOTE) Nonreactive HCV antibody screen is consistent with no HCV infections,  unless recent infection is suspected or other evidence exists to indicate HCV infection.     Hep A IgM NON REACTIVE NON REACTIVE   Hep B C IgM NON REACTIVE NON REACTIVE    Comment: Performed at [facility], [address] [place], [place] [address]  I-Stat beta hCG blood, ED     Status: None   Collection Time: 02/12/20 11:05 PM  Result Value Ref Range   I-stat hCG, quantitative <5.0 <5 mIU/mL   Comment 3            Comment:   GEST. AGE      CONC.  (mIU/mL)   <=1 WEEK        5 - 50     2 WEEKS       50 - 500     3 WEEKS       100 - 10,000     4 WEEKS     1,000 - 30,000        FEMALE AND NON-PREGNANT FEMALE:     LESS THAN 5 mIU/mL   SARS Coronavirus 2 by RT PCR (hospital order, performed in [facility]  hospital lab) Nasopharyngeal Nasopharyngeal Swab     Status: None   Collection Time: 02/13/20  4:49 AM   Specimen: Nasopharyngeal Swab  Result Value Ref Range   SARS Coronavirus 2 NEGATIVE NEGATIVE    Comment: (NOTE) SARS-CoV-2 target nucleic acids are NOT DETECTED.  The SARS-CoV-2 RNA is generally detectable in upper and lower respiratory specimens during the acute phase of infection. The lowest concentration of SARS-CoV-2 viral copies this assay can detect is 250 copies / mL. A negative result does not preclude SARS-CoV-2 infection and should not be used as the sole basis for treatment or other patient management decisions.  A negative result may occur with improper specimen collection / handling, submission of specimen other than nasopharyngeal swab, presence of viral mutation(s) within the areas targeted by this assay, and inadequate number of viral copies (<250 copies / mL). A negative result must be combined with clinical observations, patient history, and epidemiological information.  Fact Sheet for Patients:   BoilerBrush.com.cy  Fact Sheet for Healthcare Providers: https://pope.com/  This test is not yet approved or  cleared by the [place] FDA and has been authorized for detection and/or diagnosis of SARS-CoV-2 by FDA under an Emergency Use Authorization (EUA).  This EUA will remain in effect (meaning this test can be used) for the duration of the COVID-19 declaration under  Section 564(b)(1) of the Act, 21 U.S.C. section 360bbb-3(b)(1), unless the authorization is terminated or revoked sooner.  Performed at [facility], [address], [place], [place] [address]   CBC     Status: None   Collection Time: 02/13/20  7:17 AM  Result Value Ref Range   WBC 7.0 4.0 - 10.5 K/uL   RBC 4.39 3.87 - 5.11 MIL/uL   Hemoglobin 12.6 12.0 - 15.0 g/dL   HCT 81.1 36 - 46 %   MCV 87.5 80.0 - 100.0 fL   MCH 28.7  26.0 - 34.0 pg   MCHC 32.8 30.0 - 36.0 g/dL   RDW 91.4 78.2 - 95.6 %   Platelets 150 150 - 400 K/uL   nRBC 0.0 0.0 - 0.2 %    Comment: Performed at [facility], [address] [place], [place] [address]  Comprehensive metabolic panel     Status: Abnormal   Collection Time: 02/13/20  7:17 AM  Result Value Ref Range   Sodium 138 135 - 145 mmol/L   Potassium 2.9 (L) 3.5 - 5.1 mmol/L   Chloride 102 98 - 111 mmol/L   CO2 25 22 - 32 mmol/L   Glucose, Bld 119 (H) 70 - 99 mg/dL    Comment: Glucose reference range applies only to samples taken after fasting for at least 8 hours.   BUN 8 6 - 20 mg/dL   Creatinine, Ser 6.57 0.44 - 1.00 mg/dL   Calcium 9.0 8.9 - 84.6 mg/dL   Total Protein 6.8 6.5 - 8.1 g/dL   Albumin 3.6 3.5 - 5.0 g/dL   AST 9,629 (H) 15 - 41 U/L   ALT 1,070 (H) 0 - 44 U/L   Alkaline Phosphatase 212 (H) 38 - 126 U/L   Total Bilirubin 2.8 (H) 0.3 - 1.2 mg/dL   GFR calc non Af Amer >60 >60 mL/min   GFR calc Af Amer >60 >60 mL/min   Anion gap 11 5 - 15    Comment: Performed at [facility], [address] [place], [place] [address]  Magnesium     Status: None   Collection Time: 02/13/20  7:17 AM  Result Value Ref Range   Magnesium 2.3 1.7 - 2.4 mg/dL    Comment: Performed at [facility], [address] [place], [place] [address]    Imaging / Studies: US Abdomen Complete  Result Date: 02/09/2020 CLINICAL DATA:  Elevated liver function tests. Epigastric pain for 2 days. Breast cancer. Partial right nephrectomy for a cyst. EXAM: ABDOMEN ULTRASOUND COMPLETE COMPARISON:  None. FINDINGS: Gallbladder: Gallstones up to 7 mm. Contracted gallbladder with wall thickening at 6 mm. No Peri cholecystic fluid. Tenderness with gallbladder palpation. Common bile duct: Diameter: Normal, 5 mm. Liver: No focal lesion identified. Within normal limits in parenchymal echogenicity. Portal vein is patent on color Doppler imaging with normal  direction of blood flow towards the liver. IVC: No abnormality visualized. Pancreas: Visualized portion unremarkable. Spleen: Size and appearance within normal limits. Right Kidney: Length: 8.6 cm. Presumably postoperative decreased size. Left Kidney: Length: 11.1 cm.  1.5 cm upper pole cyst. Abdominal aorta: No aneurysm visualized. Other findings: No ascites. IMPRESSION: 1. Contracted gallbladder with stones and wall thickening. Tenderness to gallbladder palpation without pericholecystic fluid. Findings are suspicious for chronic cholecystitis. A component of acute superimposed inflammation cannot be excluded, given tenderness. 2. No biliary duct dilatation. Electronically Signed   By: Jeronimo Greaves M.D.   On: 02/09/2020  15:32     .Ardeth Sportsman, M.D., F.A.C.S. Gastrointestinal and Minimally Invasive Surgery [facility] [address] [place], [place] [address] [phone] Main / Paging  02/13/2020 10:05 AM    Ardeth Sportsman

## 2020-02-13 NOTE — Anesthesia Postprocedure Evaluation (Signed)
Anesthesia Post Note  Patient: Melissa Atkinson  Procedure(s) Performed: LAPAROSCOPIC CHOLECYSTECTOMY, CHOLANGIOGRAM, LIVER BIOPSY (N/A )     Patient location during evaluation: PACU Anesthesia Type: General Level of consciousness: awake and alert Pain management: pain level controlled Vital Signs Assessment: post-procedure vital signs reviewed and stable Respiratory status: spontaneous breathing, nonlabored ventilation and respiratory function stable Cardiovascular status: blood pressure returned to baseline and stable Postop Assessment: no apparent nausea or vomiting Anesthetic complications: no   No complications documented.  Last Vitals:  Vitals:   02/13/20 1300 02/13/20 1328  BP:  (!) 154/95  Pulse:  66  Resp:  14  Temp: 36.6 C 36.4 C  SpO2:  97%    Last Pain:  Vitals:   02/13/20 1328  TempSrc: Oral  PainSc:                  Lynda Rainwater

## 2020-02-13 NOTE — Op Note (Signed)
02/13/2020  PATIENT:  Melissa Atkinson  49 y.o. female  Patient Care Team: Silverio Decamp, MD as PCP - General (Family Medicine) Stanford Breed Denice Bors, MD as PCP - Cardiology (Cardiology) Michael Boston, MD as Consulting Physician (General Surgery)  PRE-OPERATIVE DIAGNOSIS:    Acute on Chronic Cholecystitis  POST-OPERATIVE DIAGNOSIS:   Acute on Chronic Cholecystitis Choledocolithiasis Liver: Fatty steatohepatitis  PROCEDURE:  Core Liver Biopsy & Laparoscopic cholecystectomy with intraoperative cholangiogram  SURGEON:  Adin Hector, MD, FACS.  ASSISTANT: OR Staff   ANESTHESIA:    General with endotracheal intubation Local anesthetic as a field block  EBL:  (See Anesthesia Intraoperative Record) Total I/O In: 1000 [I.V.:900; IV Piggyback:100] Out: 20 [Blood:20]   Delay start of Pharmacological VTE agent (>24hrs) due to surgical blood loss or risk of bleeding:  no  DRAINS: None   SPECIMEN: Gallbladder & Core liver biopsies    DISPOSITION OF SPECIMEN:  PATHOLOGY  COUNTS:  YES  PLAN OF CARE: Admit to inpatient   PATIENT DISPOSITION:  PACU - hemodynamically stable.  INDICATION: Pleasant woman with symptoms of biliary colic and gallstones. Recurrent attacks. Seen by my partner, Dr. Ninfa Linden, recommendation made for cholecystectomy. That was scheduled for next week. Unfortunate she had recurrent pain and symptoms. Returned. Elevated liver function test. Recommendation made for hospitalization and cholecystectomy.  The anatomy & physiology of hepatobiliary & pancreatic function was discussed.  The pathophysiology of gallbladder dysfunction was discussed.  Natural history risks without surgery was discussed.   I feel the risks of no intervention will lead to serious problems that outweigh the operative risks; therefore, I recommended cholecystectomy to remove the pathology.  I explained laparoscopic techniques with possible need for an open approach.  Probable  cholangiogram to evaluate the bilary tract was explained as well.    Risks such as bleeding, infection, abscess, leak, injury to other organs, need for further treatment, heart attack, death, and other risks were discussed.  I noted a good likelihood this will help address the problem.  Possibility that this will not correct all abdominal symptoms was explained.  Goals of post-operative recovery were discussed as well.  We will work to minimize complications.  An educational handout further explaining the pathology and treatment options was given as well.  Questions were answered.  The patient expresses understanding & wishes to proceed with surgery.  OR FINDINGS: Gallbladder wall edema and inflammation consistent with acute calculus cholecystitis. Some changes and adhesions consistent with chronic phase as well.  Dilated biliary system with reverse meniscus kind and hyperlucencies in the distal common bile duct. No passage of contrast into the duodenum. Highly suggestive of choledocholithiasis.  Liver: Fatty steatohepatitis  DESCRIPTION:   Informed consent was confirmed.  The patient underwent general anaesthesia without difficulty.  The patient was positioned appropriately.  VTE prevention in place.  The patient's abdomen was clipped, prepped, & draped in a sterile fashion.  Surgical timeout confirmed our plan.  Given her history of prior abdominal surgery including rectus muscle flaps and mesh hernia repairs, I decided to do multiport procedure with entry away from prior surgeries. Peritoneal entry with a laparoscopic port was obtained using varies needle entry at the left subcostal ridge and then optical entry optical entry technique in the right upper abdomen as the patient was positioned in reverse Trendelenburg.  Entry was clean.  I induced carbon dioxide insufflation.  Camera inspection revealed no injury.  Extra ports were carefully placed under direct laparoscopic visualization.  I turned  attention  to the right upper quadrant. Gallbladder was edematous and swollen consistent with acute cholecystitis. The gallbladder fundus was elevated cephalad. I used hook cautery to free the peritoneal coverings between the gallbladder and the liver on the posteriolateral and anteriomedial walls.   I used careful blunt and hook dissection to help get a good critical view of the cystic artery and cystic duct. I did further dissection to free 80%of the gallbladder off the liver bed to get a good critical view of the infundibulum and cystic duct.  I dissected out the cystic artery; and, after getting a good 360 view, ligated the anterior & posterior branches of the cystic artery close on the infundibulum using the Harmonic ultrasonic dissection.    I skeletonized the cystic duct. It was rather dilated. I placed a clip on the infundibulum. I did a partial cystic duct-otomy and ensured patency. I placed a 5 [place] cholangiocatheter through a puncture site at the right subcostal ridge of the abdominal wall and directed it into the cystic duct.  We ran a cholangiogram with dilute radio-opaque contrast and continuous fluoroscopy. Contrast flowed from a side branch consistent with cystic duct cannulization. Contrast flowed up the common hepatic duct into the right and left intrahepatic chains out to secondary radicals. Contrast flowed down the common bile duct slowly. There was some hyperlucency that cleared up consistent with sludge. However there was some persistent filling defects in a reverse meniscus sign very suggestive of choledocholithiasis. At this point Dr. Ninfa Linden came in and watch the case with me and he agreed very suspicious for positive cholangiogram.   I removed the cholangiocatheter. I freed the rest of the attachments of the gallbladder to the liver bed. I ligated the cystic duct using 0 PDS Endoloops x2. Last 1 around the entire gallbladder and coming down and ligating the cystic duct more  proximally and a little more distally. Then placed some clips as well.  I completed cystic duct transection. She had very elevated AST/ALT with fatty change in liver. Decide to go and proceed with needle core biopsies. 14-gauge Tru-Cut. I did 3 passes through a subcostal puncture incision at the site of the prior cholangiogram. I got 3 excellent cores. I ensured hemostasis on the gallbladder fossa of the liver and elsewhere. I inspected the rest of the abdomen & detected no injury nor bleeding elsewhere.  I removed the gallbladder inside and ecosac. Did have to open up the subxiphoid fascia little bit to get the thickened gallbladder out.   I closed the subxiphoid fascia transversely using #1 PDS interrupted stitches. I closed the skin using 4-0 monocryl stitch.  Sterile dressings were applied. The patient was extubated & arrived in the PACU in stable condition..  I had discussed postoperative care with the patient in the holding area. I discussed operative findings, updated the patient's status, discussed probable steps to recovery, and gave postoperative recommendations to the patient's spouse.  Recommendations were made.  Questions were answered.  He expressed understanding & appreciation. I was able to call [facility] gastroenterology who are on-call. They will see the patient and most likely need to do ERCP to evacuate the common bile duct stones later this week.  Adin Hector, M.D., F.A.C.S. Gastrointestinal and Minimally Invasive Surgery [facility] [address], [place], [place] [address] [phone] Main / Paging  02/13/2020 12:17 PM

## 2020-02-13 NOTE — H&P (View-Only) (Signed)
[facility] Gastroenterology Consult  Referring Provider: Dr.Gross/CCs Primary Care Physician:  Silverio Decamp, MD Primary Gastroenterologist: Dr. Stacie Glaze GI  Reason for Consultation: CBD stone  HPI: Melissa Atkinson is a 49 y.o. female had laparoscopic cholecystectomy with intraoperative cholangiogram and cold biopsy of the liver today.  Cholangiogram showed possible CBD stone and GI was consulted for ERCP.  The patient was in her usual state of health until a few days ago when she developed severe epigastric and upper abdominal pain, she was in the ER but after waiting for several hours and improvement in pain she went home.  She was given Prilosec and Carafate as an outpatient, however, as the pain recurred she was seen by her primary care physician and was found to have elevated liver enzymes.  She was scheduled to undergo cholecystectomy next week by Dr. Ninfa Linden and was started on antibiotics however yesterday evening she developed worsening upper abdominal pain and presented to the ER.  No prior endoscopy or colonoscopy. In 2014 she had an episode of dysphagia which was evaluated with a barium swallow which was unremarkable. Patient has lost about 20 pounds since June with dietary modifications, carbohydrate restriction and increase intake of protein. She otherwise denies acid reflux, heartburn, difficulty swallowing, pain on swallowing, change in bowel movements, noticing blood in stool or black stools. There is no family history of colon cancer. She reports having a Cologuard study recently which was unremarkable.  Past Medical History:  Diagnosis Date  . Anxiety   . Back pain   . Bicuspid aortic valve   . Breast cancer ([place])   . Cholelithiasis 01/2020  . Depression   . Joint pain   . Kidney cyst, acquired   . Obesity   . Palpitations   . Polycythemia vera ([place])   . PONV (postoperative nausea and vomiting)    Oct 31 2019 hernia with out problems  . Thoracic aortic  aneurysm ([place])   . Thyroid nodule     Past Surgical History:  Procedure Laterality Date  . DILITATION & CURRETTAGE/HYSTROSCOPY WITH THERMACHOICE ABLATION N/A 12/28/2012   Procedure: DILATATION & CURETTAGE/HYSTEROSCOPY WITH THERMACHOICE ABLATION;  Surgeon: Cyril Mourning, MD;  Location: [facility];  Service: Gynecology;  Laterality: N/A;  . MASTECTOMY     bilateral  . PARTIAL NEPHRECTOMY     RT   . TONSILLECTOMY    . TUBAL LIGATION      Prior to Admission medications   Medication Sig Start Date End Date Taking? Authorizing Provider  BYSTOLIC 10 MG tablet TAKE 1 TABLET (10 MG TOTAL) BY MOUTH DAILY. Patient taking differently: Take 10 mg by mouth daily.  11/12/19  Yes Silverio Decamp, MD  hydrochlorothiazide (MICROZIDE) 12.5 MG capsule Take 1 capsule (12.5 mg total) by mouth daily. 01/07/20 04/06/20 Yes Kilroy, Luke K, PA-C  hydrOXYzine (ATARAX/VISTARIL) 50 MG tablet Take 1 tablet (50 mg total) by mouth at bedtime and may repeat dose one time if needed. For itching. 02/04/20  Yes Early, Coralee Pesa, NP  LORazepam (ATIVAN) 0.5 MG tablet Take 1 tablet (0.5 mg total) by mouth 2 (two) times daily as needed for anxiety. 02/08/20  Yes Early, Coralee Pesa, NP  losartan (COZAAR) 25 MG tablet Take 1 tablet (25 mg total) by mouth daily. 12/15/18 02/13/20 Yes Lelon Perla, MD  pantoprazole (PROTONIX) 40 MG tablet Take 1 tablet (40 mg total) by mouth 2 (two) times daily. 02/08/20  Yes Early, Coralee Pesa, NP  promethazine (PHENERGAN) 25 MG tablet Take 1  tablet (25 mg total) by mouth every 6 (six) hours as needed for nausea or vomiting. 02/08/20  Yes Early, Coralee Pesa, NP  sucralfate (CARAFATE) 1 g tablet Take 1 tablet (1 g total) by mouth 4 (four) times daily. 02/08/20  Yes Early, Coralee Pesa, NP  famotidine (PEPCID) 20 MG tablet  02/07/20   [provider]  silver sulfADIAZINE (SILVADENE) 1 % cream Apply 1 application topically daily. Patient not taking: Reported on 02/13/2020 02/04/20   Early, Coralee Pesa, NP    Current  Facility-Administered Medications  Medication Dose Route Frequency Provider Last Rate Last Admin  . 0.9 %  sodium chloride infusion   Intravenous Q8H PRN Michael Boston, MD      . acetaminophen (TYLENOL) tablet 1,000 mg  1,000 mg Oral Q6H Michael Boston, MD      . bisacodyl (DULCOLAX) suppository 10 mg  10 mg Rectal Daily PRN Michael Boston, MD      . bupivacaine liposome (EXPAREL) 1.3 % injection 266 mg  20 mL Infiltration Once Michael Boston, MD      . cefTRIAXone (ROCEPHIN) 2 g in sodium chloride 0.9 % 100 mL IVPB  2 g Intravenous Q24H Greer Pickerel, MD   Stopped at 02/13/20 [id number]  . dextrose 5 % and 0.45 % NaCl with KCl 20 mEq/L infusion   Intravenous Continuous Greer Pickerel, MD 100 mL/hr at 02/13/20 1220 Rate Verify at 02/13/20 1220  . diphenhydrAMINE (BENADRYL) 12.5 MG/5ML elixir 12.5 mg  12.5 mg Oral Q6H PRN Greer Pickerel, MD       Or  . diphenhydrAMINE (BENADRYL) injection 12.5 mg  12.5 mg Intravenous Q6H PRN Greer Pickerel, MD      . docusate sodium (COLACE) capsule 100 mg  100 mg Oral BID Greer Pickerel, MD      . enoxaparin (LOVENOX) injection 40 mg  40 mg Subcutaneous Q24H Greer Pickerel, MD      . gabapentin (NEURONTIN) capsule 300 mg  300 mg Oral BID Michael Boston, MD      . hydrALAZINE (APRESOLINE) injection 10 mg  10 mg Intravenous Q2H PRN Greer Pickerel, MD      . HYDROmorphone (DILAUDID) 1 MG/ML injection           . lactated ringers infusion   Intravenous Continuous Michael Boston, MD 50 mL/hr at 02/13/20 1349 Restarted at 02/13/20 1349  . LORazepam (ATIVAN) tablet 0.5 mg  0.5 mg Oral BID PRN Michael Boston, MD      . losartan (COZAAR) tablet 25 mg  25 mg Oral Daily Michael Boston, MD   25 mg at 02/13/20 1356  . magnesium hydroxide (MILK OF MAGNESIA) suspension 30 mL  30 mL Oral Daily PRN Michael Boston, MD      . methocarbamol (ROBAXIN) tablet 500 mg  500 mg Oral Q6H PRN Michael Boston, MD      . metoprolol tartrate (LOPRESSOR) injection 5 mg  5 mg Intravenous Q6H PRN Michael Boston, MD      .  morphine 2 MG/ML injection 1-2 mg  1-2 mg Intravenous Q1H PRN Greer Pickerel, MD      . nebivolol (BYSTOLIC) tablet 10 mg  10 mg Oral Daily Michael Boston, MD   10 mg at 02/13/20 1356  . ondansetron (ZOFRAN-ODT) disintegrating tablet 4 mg  4 mg Oral Q6H PRN Greer Pickerel, MD       Or  . ondansetron [facility]) injection 4 mg  4 mg Intravenous Q6H PRN Greer Pickerel, MD      . oxyCODONE (  Oxy IR/ROXICODONE) immediate release tablet 5-10 mg  5-10 mg Oral Q4H PRN Greer Pickerel, MD      . pantoprazole (PROTONIX) EC tablet 40 mg  40 mg Oral BID Michael Boston, MD   40 mg at 02/13/20 1356  . potassium chloride SA (KLOR-CON) CR tablet 40 mEq  40 mEq Oral BID Michael Boston, MD      . simethicone [facility]) chewable tablet 40 mg  40 mg Oral Q6H PRN Greer Pickerel, MD      . sucralfate (CARAFATE) tablet 1 g  1 g Oral QID Michael Boston, MD   1 g at 02/13/20 1402  . traMADol (ULTRAM) tablet 50 mg  50 mg Oral Q6H PRN Michael Boston, MD        Allergies as of 02/12/2020  . (No Known Allergies)    Family History  Problem Relation Age of Onset  . Cancer Mother        lung  . Depression Mother   . Anxiety disorder Mother   . Bipolar disorder Mother   . Alcoholism Mother   . COPD Father   . Congestive Heart Failure Father   . Depression Father   . Alcoholism Father   . Stroke Maternal Grandfather     Social History   Socioeconomic History  . Marital status: Married    Spouse name: Timmothy Sours  . Number of children: 3  . Years of education: Not on file  . Highest education level: Not on file  Occupational History  . Occupation: Programmer, multimedia: [employer]  Tobacco Use  . Smoking status: Never Smoker  . Smokeless tobacco: Never Used  Vaping Use  . Vaping Use: Never used  Substance and Sexual Activity  . Alcohol use: Yes    Comment: Occasional  . Drug use: No  . Sexual activity: Not on file  Other Topics Concern  . Not on file  Social History Narrative  . Not on file   Social Determinants of Health    Financial Resource Strain:   . Difficulty of Paying Living Expenses:   Food Insecurity:   . Worried About Charity fundraiser in the Last Year:   . Arboriculturist in the Last Year:   Transportation Needs:   . Film/video editor (Medical):   Marland Kitchen Lack of Transportation (Non-Medical):   Physical Activity:   . Days of Exercise per Week:   . Minutes of Exercise per Session:   Stress:   . Feeling of Stress :   Social Connections:   . Frequency of Communication with Friends and Family:   . Frequency of Social Gatherings with Friends and Family:   . Attends Religious Services:   . Active Member of Clubs or Organizations:   . Attends Archivist Meetings:   Marland Kitchen Marital Status:   Intimate Partner Violence:   . Fear of Current or Ex-Partner:   . Emotionally Abused:   Marland Kitchen Physically Abused:   . Sexually Abused:     Review of Systems:  GI: Described in detail in HPI.    Gen: Denies any fever, chills, rigors, night sweats, anorexia, fatigue, weakness, malaise, involuntary weight loss, and sleep disorder CV: Denies chest pain, angina, palpitations, syncope, orthopnea, PND, peripheral edema, and claudication. Resp: Denies dyspnea, cough, sputum, wheezing, coughing up blood. GU : Denies urinary burning, blood in urine, urinary frequency, urinary hesitancy, nocturnal urination, and urinary incontinence. MS: Denies joint pain or swelling.  Denies muscle weakness, cramps, atrophy.  Derm: Denies rash, itching, oral ulcerations, hives, unhealing ulcers.  Psych: Denies depression, anxiety, memory loss, suicidal ideation, hallucinations,  and confusion. Heme: Denies bruising, bleeding, and enlarged lymph nodes. Neuro:  Denies any headaches, dizziness, paresthesias. Endo:  Denies any problems with DM, thyroid, adrenal function.  Physical Exam: Vital signs in last 24 hours: Temp:  [97.6 F (36.4 C)-98 F (36.7 C)] 97.6 F (36.4 C) (08/18 1523) Pulse Rate:  [58-72] 70 (08/18  1523) Resp:  [14-19] 18 (08/18 1523) BP: (112-156)/(76-99) 112/78 (08/18 1523) SpO2:  [95 %-100 %] 100 % (08/18 1523) Weight:  [86.1 kg] 86.1 kg (08/18 1328) Last BM Date: 02/12/20  General:   Alert,  Well-developed, well-nourished, pleasant and cooperative in NAD Head:  Normocephalic and atraumatic. Eyes:  Sclera clear, no icterus.   Conjunctiva pink. Ears:  Normal auditory acuity. Nose:  No deformity, discharge,  or lesions. Mouth:  No deformity or lesions.  Oropharynx pink & moist. Neck:  Supple; no masses or thyromegaly. Lungs:  Clear throughout to auscultation.   No wheezes, crackles, or rhonchi. No acute distress. Heart:  Regular rate and rhythm; no murmurs, clicks, rubs,  or gallops. Extremities:  Without clubbing or edema. Neurologic:  Alert and  oriented x4;  grossly normal neurologically. Skin:  Intact without significant lesions or rashes. Psych:  Alert and cooperative. Normal mood and affect. Abdomen: Surgical incisions noted, mild distention, no tenderness, hypoactive bowel sounds        Lab Results: Recent Labs    02/12/20 2246 02/13/20 0717  WBC 9.0 7.0  HGB 13.3 12.6  HCT 40.5 38.4  PLT 163 150   BMET Recent Labs    02/12/20 2246 02/13/20 0717  NA 138 138  K 3.2* 2.9*  CL 97* 102  CO2 28 25  GLUCOSE 122* 119*  BUN 12 8  CREATININE 0.74 0.57  CALCIUM 9.3 9.0   LFT Recent Labs    02/13/20 0717  PROT 6.8  ALBUMIN 3.6  AST 1,106*  ALT 1,070*  ALKPHOS 212*  BILITOT 2.8*   PT/INR No results for input(s): LABPROT, INR in the last 72 hours.  Studies/Results: DG Cholangiogram Operative  Result Date: 02/13/2020 CLINICAL DATA:  49 year old with cholelithiasis. EXAM: INTRAOPERATIVE CHOLANGIOGRAM TECHNIQUE: Cholangiographic images from the C-arm fluoroscopic device were submitted for interpretation post-operatively. Please see the procedural report for the amount of contrast and the fluoroscopy time utilized. COMPARISON:  Ultrasound 02/09/2020  FINDINGS: Contrast opacification of the common bile duct and intrahepatic bile ducts. Mild dilatation of the extrahepatic biliary system. There is no drainage into the duodenum. Mobile filling defects in the biliary system likely represent gas. A small persistent filling defect in the distal common bile duct may represent a small stone. IMPRESSION: Obstruction of the distal common bile duct, likely from a stone. Electronically Signed   By: Markus Daft M.D.   On: 02/13/2020 13:03    Impression: Suspected CBD stone on cholangiogram Elevated liver enzymes, T bili 2.8/AST 1106/ALT 1070/ALP 212 Hepatitis A, B and C studies unremarkable   Plan: On clear liquid diet, patient to remain n.p.o. post midnight for ERCP in a.m. Continue IV ceftriaxone On D5 half-normal saline with 20 M EQ KCl at 100 cc an hour Also on pantoprazole 40 mg twice daily and sucralfate 1 g 4 times a day-I think this can be discontinued. Pain seems to be adequately controlled.  The risks (pancreatitis, bleeding, perforation, anesthesia risks )and the benefits of the procedure were discussed with the patient in details. She  understands and verbalizes consent.   LOS: 0 days   Ronnette Juniper, MD  02/13/2020, 4:19 PM

## 2020-02-13 NOTE — Anesthesia Preprocedure Evaluation (Addendum)
Anesthesia Evaluation  Patient identified by MRN, date of birth, ID band Patient awake    Reviewed: Allergy & Precautions, NPO status , Patient's Chart, lab work & pertinent test results  History of Anesthesia Complications (+) PONV  Airway Mallampati: II  TM Distance: >3 FB Neck ROM: Full    Dental no notable dental hx. (+) Teeth Intact, Dental Advisory Given   Pulmonary neg pulmonary ROS,    Pulmonary exam normal breath sounds clear to auscultation       Cardiovascular hypertension, Normal cardiovascular exam Rhythm:Regular Rate:Normal     Neuro/Psych PSYCHIATRIC DISORDERS  Neuromuscular disease    GI/Hepatic negative GI ROS, Neg liver ROS,   Endo/Other  negative endocrine ROS  Renal/GU   negative genitourinary   Musculoskeletal negative musculoskeletal ROS (+)   Abdominal   Peds  Hematology   Anesthesia Other Findings   Reproductive/Obstetrics                            Anesthesia Physical Anesthesia Plan  ASA: II  Anesthesia Plan: General   Post-op Pain Management:    Induction: Intravenous  PONV Risk Score and Plan: 3 and Treatment may vary due to age or medical condition, Ondansetron and Dexamethasone  Airway Management Planned: Oral ETT  Additional Equipment: None  Intra-op Plan:   Post-operative Plan: Extubation in OR  Informed Consent: I have reviewed the patients History and Physical, chart, labs and discussed the procedure including the risks, benefits and alternatives for the proposed anesthesia with the patient or authorized representative who has indicated his/her understanding and acceptance.     Dental advisory given  Plan Discussed with: CRNA and Anesthesiologist  Anesthesia Plan Comments: (ERCP for CBD stone under GA  )       Anesthesia Quick Evaluation

## 2020-02-13 NOTE — Progress Notes (Signed)
Called for report at 0735. No answer.

## 2020-02-13 NOTE — Transfer of Care (Signed)
Immediate Anesthesia Transfer of Care Note  Patient: Melissa Atkinson  Procedure(s) Performed: LAPAROSCOPIC CHOLECYSTECTOMY, CHOLANGIOGRAM, LIVER BIOPSY (N/A )  Patient Location: PACU  Anesthesia Type:General  Level of Consciousness: awake and patient cooperative  Airway & Oxygen Therapy: Patient Spontanous Breathing and Patient connected to face mask oxygen  Post-op Assessment: Report given to RN and Post -op Vital signs reviewed and stable  Post vital signs: Reviewed and stable  Last Vitals:  Vitals Value Taken Time  BP    Temp    Pulse    Resp    SpO2      Last Pain:  Vitals:   02/13/20 0913  TempSrc: Oral  PainSc: 0-No pain      Patients Stated Pain Goal: 3 (76/16/07 3710)  Complications: No complications documented.

## 2020-02-14 ENCOUNTER — Other Ambulatory Visit (HOSPITAL_COMMUNITY): Payer: 59

## 2020-02-14 ENCOUNTER — Encounter (HOSPITAL_COMMUNITY): Admission: EM | Disposition: A | Discharge status: Home / Self Care | Payer: Self-pay | Source: Home / Self Care

## 2020-02-14 ENCOUNTER — Inpatient Hospital Stay (HOSPITAL_COMMUNITY): Payer: 59 | Admitting: Anesthesiology

## 2020-02-14 ENCOUNTER — Inpatient Hospital Stay (HOSPITAL_COMMUNITY): Payer: 59

## 2020-02-14 ENCOUNTER — Encounter (HOSPITAL_COMMUNITY): Payer: Self-pay | Admitting: Surgery

## 2020-02-14 ENCOUNTER — Other Ambulatory Visit: Payer: Self-pay

## 2020-02-14 HISTORY — PX: SPHINCTEROTOMY: SHX5544

## 2020-02-14 HISTORY — PX: BILIARY DILATION: SHX6850

## 2020-02-14 HISTORY — PX: ERCP: SHX5425

## 2020-02-14 LAB — CBC
HCT: 36.9 % (ref 36.0–46.0)
Hemoglobin: 11.9 g/dL — ABNORMAL LOW (ref 12.0–15.0)
MCH: 28.8 pg (ref 26.0–34.0)
MCHC: 32.2 g/dL (ref 30.0–36.0)
MCV: 89.3 fL (ref 80.0–100.0)
Platelets: 142 10*3/uL — ABNORMAL LOW (ref 150–400)
RBC: 4.13 MIL/uL (ref 3.87–5.11)
RDW: 13.7 % (ref 11.5–15.5)
WBC: 12.3 10*3/uL — ABNORMAL HIGH (ref 4.0–10.5)
nRBC: 0 % (ref 0.0–0.2)

## 2020-02-14 LAB — HEPATIC FUNCTION PANEL
ALT: 1118 U/L — ABNORMAL HIGH (ref 0–44)
AST: 486 U/L — ABNORMAL HIGH (ref 15–41)
Albumin: 3.6 g/dL (ref 3.5–5.0)
Alkaline Phosphatase: 227 U/L — ABNORMAL HIGH (ref 38–126)
Bilirubin, Direct: 0.4 mg/dL — ABNORMAL HIGH (ref 0.0–0.2)
Indirect Bilirubin: 0.6 mg/dL (ref 0.3–0.9)
Total Bilirubin: 1 mg/dL (ref 0.3–1.2)
Total Protein: 6.9 g/dL (ref 6.5–8.1)

## 2020-02-14 LAB — BASIC METABOLIC PANEL
Anion gap: 6 (ref 5–15)
BUN: <5 mg/dL — ABNORMAL LOW (ref 6–20)
CO2: 23 mmol/L (ref 22–32)
Calcium: 8.7 mg/dL — ABNORMAL LOW (ref 8.9–10.3)
Chloride: 110 mmol/L (ref 98–111)
Creatinine, Ser: 0.65 mg/dL (ref 0.44–1.00)
GFR calc Af Amer: >60 mL/min (ref >60)
GFR calc non Af Amer: >60 mL/min (ref >60)
Glucose, Bld: 142 mg/dL — ABNORMAL HIGH (ref 70–99)
Potassium: 4.4 mmol/L (ref 3.5–5.1)
Sodium: 139 mmol/L (ref 135–145)

## 2020-02-14 LAB — LIPASE, BLOOD: Lipase: 3206 U/L — ABNORMAL HIGH (ref 11–51)

## 2020-02-14 LAB — PROTIME-INR
INR: 1.1 (ref 0.8–1.2)
Prothrombin Time: 13.5 seconds (ref 11.4–15.2)

## 2020-02-14 LAB — SURGICAL PATHOLOGY

## 2020-02-14 LAB — MAGNESIUM: Magnesium: 2.1 mg/dL (ref 1.7–2.4)

## 2020-02-14 SURGERY — ERCP, WITH INTERVENTION IF INDICATED
Anesthesia: General

## 2020-02-14 MED ORDER — ROCURONIUM BROMIDE 10 MG/ML (PF) SYRINGE
PREFILLED_SYRINGE | INTRAVENOUS | Status: DC | PRN
  Administered 2020-02-14: 40 mg via INTRAVENOUS
  Administered 2020-02-14: 10 mg via INTRAVENOUS

## 2020-02-14 MED ORDER — GLUCAGON HCL RDNA (DIAGNOSTIC) 1 MG IJ SOLR
INTRAMUSCULAR | Status: AC
  Filled 2020-02-14: qty 1

## 2020-02-14 MED ORDER — PROPOFOL 10 MG/ML IV BOLUS
INTRAVENOUS | Status: DC | PRN
  Administered 2020-02-14: 180 mg via INTRAVENOUS

## 2020-02-14 MED ORDER — FENTANYL CITRATE (PF) 100 MCG/2ML IJ SOLN
INTRAMUSCULAR | Status: AC
  Filled 2020-02-14: qty 2

## 2020-02-14 MED ORDER — PROPOFOL 500 MG/50ML IV EMUL
INTRAVENOUS | Status: AC
  Filled 2020-02-14: qty 50

## 2020-02-14 MED ORDER — DEXAMETHASONE SODIUM PHOSPHATE 10 MG/ML IJ SOLN
INTRAMUSCULAR | Status: DC | PRN
  Administered 2020-02-14: 10 mg via INTRAVENOUS

## 2020-02-14 MED ORDER — MIDAZOLAM HCL 2 MG/2ML IJ SOLN
INTRAMUSCULAR | Status: AC
  Filled 2020-02-14: qty 2

## 2020-02-14 MED ORDER — SODIUM CHLORIDE 0.9 % IV SOLN
INTRAVENOUS | Status: DC | PRN
  Administered 2020-02-14: 45 mL

## 2020-02-14 MED ORDER — LACTATED RINGERS IV BOLUS
1000.0000 mL | Freq: Once | INTRAVENOUS | Status: AC
  Administered 2020-02-14: 21:00:00 1000 mL via INTRAVENOUS

## 2020-02-14 MED ORDER — LIDOCAINE 2% (20 MG/ML) 5 ML SYRINGE
INTRAMUSCULAR | Status: DC | PRN
  Administered 2020-02-14: 80 mg via INTRAVENOUS

## 2020-02-14 MED ORDER — SUGAMMADEX SODIUM 200 MG/2ML IV SOLN
INTRAVENOUS | Status: DC | PRN
  Administered 2020-02-14: 200 mg via INTRAVENOUS

## 2020-02-14 MED ORDER — MIDAZOLAM HCL 2 MG/2ML IJ SOLN
INTRAMUSCULAR | Status: DC | PRN
  Administered 2020-02-14: 2 mg via INTRAVENOUS

## 2020-02-14 MED ORDER — DROPERIDOL 2.5 MG/ML IJ SOLN
INTRAMUSCULAR | Status: AC
  Filled 2020-02-14: qty 2

## 2020-02-14 MED ORDER — FENTANYL CITRATE (PF) 100 MCG/2ML IJ SOLN
INTRAMUSCULAR | Status: DC | PRN
  Administered 2020-02-14: 25 ug via INTRAVENOUS
  Administered 2020-02-14: 100 ug via INTRAVENOUS
  Administered 2020-02-14: 25 ug via INTRAVENOUS

## 2020-02-14 MED ORDER — DROPERIDOL 2.5 MG/ML IJ SOLN
INTRAMUSCULAR | Status: DC | PRN
  Administered 2020-02-14: .625 mg via INTRAVENOUS

## 2020-02-14 MED ORDER — INDOMETHACIN 50 MG RE SUPP
RECTAL | Status: AC
  Filled 2020-02-14: qty 2

## 2020-02-14 MED ORDER — INDOMETHACIN 50 MG RE SUPP
RECTAL | Status: DC | PRN
  Administered 2020-02-14: 50 mg via RECTAL

## 2020-02-14 MED ORDER — LACTATED RINGERS IV SOLN: INTRAVENOUS | Status: DC

## 2020-02-14 MED ORDER — GLUCAGON HCL RDNA (DIAGNOSTIC) 1 MG IJ SOLR
INTRAMUSCULAR | Status: DC | PRN
  Administered 2020-02-14 (×3): .5 mg via INTRAVENOUS

## 2020-02-14 MED ORDER — ONDANSETRON HCL 4 MG/2ML IJ SOLN
INTRAMUSCULAR | Status: DC | PRN
  Administered 2020-02-14: 4 mg via INTRAVENOUS

## 2020-02-14 MED ORDER — SODIUM CHLORIDE 0.9 % IV SOLN: INTRAVENOUS | Status: DC

## 2020-02-14 MED ORDER — LACTATED RINGERS IV SOLN: INTRAVENOUS | Status: DC | PRN

## 2020-02-14 NOTE — Brief Op Note (Signed)
02/13/2020 - 02/14/2020  1:21 PM  PATIENT:  Melissa Atkinson  49 y.o. female  PRE-OPERATIVE DIAGNOSIS:  CBD stone  POST-OPERATIVE DIAGNOSIS:  sphincterotomy, removal of stones with extraction balloon, sphincteroplasty  PROCEDURE:  Procedure(s): ENDOSCOPIC RETROGRADE CHOLANGIOPANCREATOGRAPHY (ERCP) (N/A) SPHINCTEROTOMY BILIARY DILATION  SURGEON:  Surgeon(s) and Role:    Ronnette Juniper, MD - Primary  PHYSICIAN ASSISTANT:   ASSISTANTS:Patricia Ford,RN, Cletis Athens, Tech  ANESTHESIA:   MAC  EBL:  Minimal  BLOOD ADMINISTERED:none  DRAINS: none   LOCAL MEDICATIONS USED:  NONE  SPECIMEN:  No Specimen  DISPOSITION OF SPECIMEN:  N/A  COUNTS:  YES  TOURNIQUET:  * No tourniquets in log *  DICTATION: .Dragon Dictation  PLAN OF CARE: Admit to inpatient   PATIENT DISPOSITION:  PACU - hemodynamically stable.   Delay start of Pharmacological VTE agent (>24hrs) due to surgical blood loss or risk of bleeding: not applicable

## 2020-02-14 NOTE — Anesthesia Postprocedure Evaluation (Signed)
Anesthesia Post Note  Patient: Melissa Atkinson  Procedure(s) Performed: ENDOSCOPIC RETROGRADE CHOLANGIOPANCREATOGRAPHY (ERCP) (N/A ) [name]     Patient location during evaluation: PACU Anesthesia Type: General Level of consciousness: awake and alert Pain management: pain level controlled Vital Signs Assessment: post-procedure vital signs reviewed and stable Respiratory status: spontaneous breathing, nonlabored ventilation, respiratory function stable and patient connected to nasal cannula oxygen Cardiovascular status: blood pressure returned to baseline and stable Postop Assessment: no apparent nausea or vomiting Anesthetic complications: no   No complications documented.  Last Vitals:  Vitals:   02/14/20 1345 02/14/20 1355  BP: 119/73 122/64  Pulse: 64 (!) 58  Resp: 19 18  Temp:    SpO2: 95% 95%    Last Pain:  Vitals:   02/14/20 1355  TempSrc:   PainSc: 0-No pain                 Barnet Glasgow

## 2020-02-14 NOTE — Op Note (Signed)
[facility] Patient Name: Melissa Atkinson Procedure Date: 02/14/2020 MRN: [id number] Attending MD: Ronnette Juniper , MD Date of Birth: October 02, 1970 CSN: [id number] Age: 49 Admit Type: Inpatient Procedure:                ERCP Indications:              Abnormal intraoperative cholangiogram, Elevated                            liver enzymes Providers:                Ronnette Juniper, MD, Cleda Daub, RN, Cletis Athens,                            Technician Referring MD:             Dr.Gross/CCS Medicines:                Monitored Anesthesia Care Complications:            No immediate complications. Estimated blood loss:                            Minimal. Estimated Blood Loss:     Estimated blood loss was minimal. Procedure:                Pre-Anesthesia Assessment:                           - Prior to the procedure, a History and Physical                            was performed, and patient medications and                            allergies were reviewed. The patient's tolerance of                            previous anesthesia was also reviewed. The risks                            and benefits of the procedure and the sedation                            options and risks were discussed with the patient.                            All questions were answered, and informed consent                            was obtained. Prior Anticoagulants: The patient has                            taken no previous anticoagulant or antiplatelet                            agents. ASA Grade Assessment: II - A patient with  mild systemic disease. After reviewing the risks                            and benefits, the patient was deemed in                            satisfactory condition to undergo the procedure.                           After obtaining informed consent, the scope was                            passed under direct vision. Throughout the                             procedure, the patient's blood pressure, pulse, and                            oxygen saturations were monitored continuously. The                            Duodenoscope was introduced through the mouth, and                            used to inject contrast into and used to inject                            contrast into the bile duct and ventral pancreatic                            duct. The ERCP was accomplished without difficulty.                            The patient tolerated the procedure well. Scope In: Scope Out: Findings:      The scout film was normal. The esophagus was successfully intubated       under direct vision. The scope was advanced to a normal major papilla in       the descending duodenum without detailed examination of the pharynx,       larynx and associated structures, and upper GI tract. The upper GI tract       was grossly normal.      Initial canulation was very chanllenging and the wire advacned into the       pancreatic duct. It was left in the pancreatic duct and canulation of       CBD was performed with a double wire technique. The wire seemed to       advance in the direction of cystic duct. At that point, the wire in the       pancreatic duct was removed and another sphintertome was used to       canulate the CBD and the wire was advanced into the left intrahepatic       duct.      The bile duct was deeply cannulated with the sphincterotome. Contrast       was injected. I personally interpreted  the bile duct images.      The flow of contrast through the ducts was poor. Image quality was       adequate.      A cholecystectomy had been performed.      A straight Roadrunner wire was passed into the biliary tree. A 10 mm       biliary sphincterotomy was made with a braided sphincterotome using ERBE       electrocautery. The sphincterotomy oozed blood.      A 9/12 mm balloon catheter would only advance into the distal CBD but       could not be  advanced any deeper in the CBD.      Attempts at cholangiogram was not successful as the contrast did not       extend well in the CBD and was mostly noted in the duodenum.      Subsequently, dilation of the distal common bile duct was performed with       a 10 mm balloon dilator which was successful.      The biliary tree was swept with a 12 mm balloon starting at the lower       third of the main duct. Nothing was found. The balloon catheter could       not be advanced in the mid or distal CBD. Impression:               - The patient has had a cholecystectomy.                           - A biliary sphincterotomy was performed.                           - The biliary tree was swept and nothing was found.                           - Common bile duct was successfully dilated. Moderate Sedation:      Patient did not receive moderate sedation for this procedure, but       instead received monitored anesthesia care. Recommendation:           - Clear liquid diet.                           - Liver enzymes in am.                           If LFTs remain elevated, may need an MRI/MRCP and                            possibly spyglass examination of the CBD. Procedure Code(s):        --- Professional ---                           [phone], Endoscopic retrograde                            cholangiopancreatography (ERCP); with                            trans-endoscopic balloon dilation of  biliary/pancreatic duct(s) or of ampulla                            (sphincteroplasty), including sphincterotomy, when                            performed, each duct Diagnosis Code(s):        --- Professional ---                           Z90.49, Acquired absence of other specified parts                            of digestive tract                           R74.8, Abnormal levels of other serum enzymes                           R93.2, Abnormal findings on diagnostic imaging of                             liver and biliary tract CPT copyright 2019 [employer]. All rights reserved. The codes documented in this report are preliminary and upon coder review may  be revised to meet current compliance requirements. Ronnette Juniper, MD 02/14/2020 1:00:17 PM This report has been signed electronically. Number of Addenda: 0

## 2020-02-14 NOTE — Anesthesia Procedure Notes (Signed)
Procedure Name: Intubation Date/Time: 02/14/2020 10:32 AM Performed by: Sharlette Dense, CRNA Patient Re-evaluated:Patient Re-evaluated prior to induction Oxygen Delivery Method: Circle system utilized Preoxygenation: Pre-oxygenation with 100% oxygen Induction Type: IV induction Ventilation: Mask ventilation without difficulty Laryngoscope Size: Miller and 2 Grade View: Grade I Tube type: Oral Tube size: 7.0 mm Number of attempts: 1 Airway Equipment and Method: Stylet Placement Confirmation: ETT inserted through vocal cords under direct vision,  positive ETCO2 and breath sounds checked- equal and bilateral Secured at: 21 cm Tube secured with: Tape Dental Injury: Teeth and Oropharynx as per pre-operative assessment

## 2020-02-14 NOTE — Interval H&P Note (Signed)
History and Physical Interval Note: 48/female with abnormal cholangiogram suspicious for CBD stone and abnormal LFTs for an ERCP today.  02/14/2020 10:14 AM  Melissa Atkinson  has presented today for ERCP, with the diagnosis of CBD stone.  The various methods of treatment have been discussed with the patient and family. After consideration of risks, benefits and other options for treatment, the patient has consented to  Procedure(s): ENDOSCOPIC RETROGRADE CHOLANGIOPANCREATOGRAPHY (ERCP) (N/A) as a surgical intervention.  The patient's history has been reviewed, patient examined, no change in status, stable for surgery.  I have reviewed the patient's chart and labs.  Questions were answered to the patient's satisfaction.     Ronnette Juniper

## 2020-02-14 NOTE — Progress Notes (Signed)
Day of Surgery  Subjective: CC: Patient reports that she is frustrated. Back from ERCP now.  Having more epigastric right sided abdominal pain (more RLQ she states) then she was yesterday. No nausea if she doesn't think about it. Denies emesis. On CLD now.   Objective: Vital signs in last 24 hours: Temp:  [97.6 F (36.4 C)-98.8 F (37.1 C)] 98.4 F (36.9 C) (08/19 1254) Pulse Rate:  [58-70] 58 (08/19 1355) Resp:  [12-19] 18 (08/19 1355) BP: (100-130)/(54-82) 122/64 (08/19 1355) SpO2:  [95 %-100 %] 95 % (08/19 1355) Weight:  [86.1 kg] 86.1 kg (08/19 0935) Last BM Date: 02/12/20  Intake/Output from previous day: 08/18 0701 - 08/19 0700 In: 4764.7 [P.O.:1464; I.V.:3005.9; IV Piggyback:294.8] Out: 4600 [Urine:4550; Blood:50] Intake/Output this shift: Total I/O In: 1244.2 [P.O.:120; I.V.:1119; IV Piggyback:5.1] Out: -   PE: Gen:  Alert, NAD, pleasant Pulm: Normal rate and effort  Abd: Soft, ND, epigastric and RUQ tenderness without peritonitis. +BS. Laparoscopic incisions c/d/i with tegaderm in place Ext:  No LE edema  Psych: A&Ox3  Skin: no rashes noted, warm and dry  Lab Results:  Recent Labs    02/13/20 0717 02/14/20 0502  WBC 7.0 12.3*  HGB 12.6 11.9*  HCT 38.4 36.9  PLT 150 142*   BMET Recent Labs    02/13/20 0717 02/14/20 0502  NA 138 139  K 2.9* 4.4  CL 102 110  CO2 25 23  GLUCOSE 119* 142*  BUN 8 <5*  CREATININE 0.57 0.65  CALCIUM 9.0 8.7*   PT/INR Recent Labs    02/14/20 0502  LABPROT 13.5  INR 1.1   CMP     Component Value Date/Time   NA 139 02/14/2020 0502   NA 143 01/02/2019 0844   K 4.4 02/14/2020 0502   CL 110 02/14/2020 0502   CO2 23 02/14/2020 0502   GLUCOSE 142 (H) 02/14/2020 0502   BUN <5 (L) 02/14/2020 0502   BUN 15 01/02/2019 0844   CREATININE 0.65 02/14/2020 0502   CREATININE 0.84 02/08/2020 1205   CALCIUM 8.7 (L) 02/14/2020 0502   PROT 6.8 02/13/2020 0717   PROT 6.9 08/22/2017 0826   ALBUMIN 3.6 02/13/2020 0717    ALBUMIN 4.4 08/22/2017 0826   AST 1,106 (H) 02/13/2020 0717   ALT 1,070 (H) 02/13/2020 0717   ALKPHOS 212 (H) 02/13/2020 0717   BILITOT 2.8 (H) 02/13/2020 0717   BILITOT <0.2 08/22/2017 0826   GFRNONAA >60 02/14/2020 0502   GFRNONAA 81 02/08/2020 1205   GFRAA >60 02/14/2020 0502   GFRAA 94 02/08/2020 1205   Lipase     Component Value Date/Time   LIPASE 31 02/12/2020 2246       Studies/Results: DG Cholangiogram Operative  Result Date: 02/13/2020 CLINICAL DATA:  49 year old with cholelithiasis. EXAM: INTRAOPERATIVE CHOLANGIOGRAM TECHNIQUE: Cholangiographic images from the C-arm fluoroscopic device were submitted for interpretation post-operatively. Please see the procedural report for the amount of contrast and the fluoroscopy time utilized. COMPARISON:  Ultrasound 02/09/2020 FINDINGS: Contrast opacification of the common bile duct and intrahepatic bile ducts. Mild dilatation of the extrahepatic biliary system. There is no drainage into the duodenum. Mobile filling defects in the biliary system likely represent gas. A small persistent filling defect in the distal common bile duct may represent a small stone. IMPRESSION: Obstruction of the distal common bile duct, likely from a stone. Electronically Signed   By: Markus Daft M.D.   On: 02/13/2020 13:03    Anti-infectives: Anti-infectives (From admission, onward)  Start     Dose/Rate Route Frequency Ordered Stop   02/13/20 0815  cefTRIAXone (ROCEPHIN) 2 g in sodium chloride 0.9 % 100 mL IVPB  Status:  Discontinued       "And" Linked Group Details   2 g 200 mL/hr over 30 Minutes Intravenous On call to O.R. 02/13/20 0804 02/13/20 1312   02/13/20 0815  metroNIDAZOLE (FLAGYL) IVPB 500 mg       "And" Linked Group Details   500 mg 100 mL/hr over 60 Minutes Intravenous On call to O.R. 02/13/20 0804 02/13/20 1040   02/13/20 0600  ceFAZolin (ANCEF) IVPB 2g/100 mL premix  Status:  Discontinued        2 g 200 mL/hr over 30 Minutes  Intravenous On call to O.R. 02/13/20 5956 02/13/20 0804   02/13/20 0445  cefTRIAXone (ROCEPHIN) 2 g in sodium chloride 0.9 % 100 mL IVPB        2 g 200 mL/hr over 30 Minutes Intravenous Every 24 hours 02/13/20 0434         Assessment/Plan Acute on Chronic Cholecystitis Choledocolithiasis  - s/p Laparoscopic Cholecystectomy with IOC and Core liver bx by Dr. Johney Maine on 02/13/20 - s/p ERCP by Dr. Ronnette Juniper on 8/19 - ERCP impression reads "biliary sphincterotomy was performed. The biliary tree was swept and nothing was found. Common bile duct was successfully dilated". GI recommends CLD, repeat LFT's in the AM and if still elevated, may need an MRI/MRCP and possibly spyglass examination of the CBD. - Will write for LFT's now. CBC, CMP, Lipase in the AM - Mobilize, Pulm toilet   FEN - CLD VTE - SCDs, Lovenox  ID - Flagyl 8/18. Rocephin 8/18 >>    LOS: 1 day    Jillyn Ledger , [facility] 02/14/2020, 3:06 PM Please see Amion for pager number during day hours 7:00am-4:30pm

## 2020-02-14 NOTE — Transfer of Care (Signed)
Immediate Anesthesia Transfer of Care Note  Patient: Melissa Atkinson  Procedure(s) Performed: ENDOSCOPIC RETROGRADE CHOLANGIOPANCREATOGRAPHY (ERCP) (N/A ) SPHINCTEROTOMY BILIARY DILATION  Patient Location: Endoscopy Unit  Anesthesia Type:General  Level of Consciousness: drowsy  Airway & Oxygen Therapy: Patient Spontanous Breathing and Patient connected to face mask oxygen  Post-op Assessment: Report given to RN and Post -op Vital signs reviewed and stable  Post vital signs: Reviewed and stable  Last Vitals:  Vitals Value Taken Time  BP    Temp    Pulse    Resp    SpO2      Last Pain:  Vitals:   02/14/20 0935  TempSrc: Oral  PainSc: 0-No pain      Patients Stated Pain Goal: 3 (81/44/81 8563)  Complications: No complications documented.

## 2020-02-15 ENCOUNTER — Inpatient Hospital Stay (HOSPITAL_COMMUNITY): Payer: 59

## 2020-02-15 ENCOUNTER — Encounter: Payer: Self-pay | Admitting: Surgery

## 2020-02-15 ENCOUNTER — Encounter (HOSPITAL_COMMUNITY): Payer: Self-pay | Admitting: Gastroenterology

## 2020-02-15 ENCOUNTER — Encounter (HOSPITAL_COMMUNITY): Admission: EM | Disposition: A | Discharge status: Home / Self Care | Payer: Self-pay | Source: Home / Self Care

## 2020-02-15 DIAGNOSIS — K851 Biliary acute pancreatitis without necrosis or infection: Secondary | ICD-10-CM | POA: Insufficient documentation

## 2020-02-15 LAB — CBC
HCT: 36.5 % (ref 36.0–46.0)
Hemoglobin: 11.3 g/dL — ABNORMAL LOW (ref 12.0–15.0)
MCH: 28.6 pg (ref 26.0–34.0)
MCHC: 31 g/dL (ref 30.0–36.0)
MCV: 92.4 fL (ref 80.0–100.0)
Platelets: 168 10*3/uL (ref 150–400)
RBC: 3.95 MIL/uL (ref 3.87–5.11)
RDW: 14.2 % (ref 11.5–15.5)
WBC: 15.9 10*3/uL — ABNORMAL HIGH (ref 4.0–10.5)
nRBC: 0 % (ref 0.0–0.2)

## 2020-02-15 LAB — BASIC METABOLIC PANEL
Anion gap: 9 (ref 5–15)
BUN: 7 mg/dL (ref 6–20)
CO2: 25 mmol/L (ref 22–32)
Calcium: 8.5 mg/dL — ABNORMAL LOW (ref 8.9–10.3)
Chloride: 108 mmol/L (ref 98–111)
Creatinine, Ser: 0.75 mg/dL (ref 0.44–1.00)
GFR calc Af Amer: >60 mL/min (ref >60)
GFR calc non Af Amer: >60 mL/min (ref >60)
Glucose, Bld: 117 mg/dL — ABNORMAL HIGH (ref 70–99)
Potassium: 4.5 mmol/L (ref 3.5–5.1)
Sodium: 142 mmol/L (ref 135–145)

## 2020-02-15 LAB — HEPATIC FUNCTION PANEL
ALT: 753 U/L — ABNORMAL HIGH (ref 0–44)
AST: 263 U/L — ABNORMAL HIGH (ref 15–41)
Albumin: 3.2 g/dL — ABNORMAL LOW (ref 3.5–5.0)
Alkaline Phosphatase: 173 U/L — ABNORMAL HIGH (ref 38–126)
Bilirubin, Direct: 0.2 mg/dL (ref 0.0–0.2)
Indirect Bilirubin: 0.4 mg/dL (ref 0.3–0.9)
Total Bilirubin: 0.6 mg/dL (ref 0.3–1.2)
Total Protein: 5.8 g/dL — ABNORMAL LOW (ref 6.5–8.1)

## 2020-02-15 LAB — LIPASE, BLOOD: Lipase: 4498 U/L — ABNORMAL HIGH (ref 11–51)

## 2020-02-15 SURGERY — ERCP, WITH INTERVENTION IF INDICATED
Anesthesia: General

## 2020-02-15 MED ORDER — PIPERACILLIN-TAZOBACTAM 3.375 G IVPB 30 MIN: 3.3750 g | Freq: Three times a day (TID) | INTRAVENOUS | Status: DC

## 2020-02-15 MED ORDER — METHOCARBAMOL 1000 MG/10ML IJ SOLN
500.0000 mg | Freq: Three times a day (TID) | INTRAVENOUS | Status: DC
  Administered 2020-02-15 – 2020-02-19 (×11): 500 mg via INTRAVENOUS
  Filled 2020-02-15 (×2): qty 500
  Filled 2020-02-15 (×3): qty 5
  Filled 2020-02-15 (×6): qty 500
  Filled 2020-02-15 (×4): qty 5
  Filled 2020-02-15 (×2): qty 500

## 2020-02-15 MED ORDER — HYDROCODONE-ACETAMINOPHEN 5-325 MG PO TABS
1.0000 | ORAL_TABLET | ORAL | Status: DC | PRN
  Administered 2020-02-18 – 2020-02-19 (×4): 1 via ORAL
  Administered 2020-02-19: 21:00:00 2 via ORAL
  Administered 2020-02-19 (×2): 1 via ORAL
  Administered 2020-02-20 (×3): 2 via ORAL
  Administered 2020-02-20: 02:00:00 1 via ORAL
  Administered 2020-02-21 (×2): 2 via ORAL
  Filled 2020-02-15: qty 2
  Filled 2020-02-15: qty 1
  Filled 2020-02-15: qty 2
  Filled 2020-02-15: qty 1
  Filled 2020-02-15 (×3): qty 2
  Filled 2020-02-15 (×3): qty 1
  Filled 2020-02-15: qty 2
  Filled 2020-02-15: qty 1
  Filled 2020-02-15 (×2): qty 2

## 2020-02-15 MED ORDER — GADOBUTROL 1 MMOL/ML IV SOLN
8.0000 mL | Freq: Once | INTRAVENOUS | Status: AC | PRN
  Administered 2020-02-15: 10:00:00 8 mL via INTRAVENOUS

## 2020-02-15 MED ORDER — PROMETHAZINE HCL 25 MG/ML IJ SOLN: 25.0000 mg | INTRAMUSCULAR | Status: DC | PRN

## 2020-02-15 MED ORDER — ONDANSETRON HCL 4 MG/2ML IJ SOLN
4.0000 mg | Freq: Four times a day (QID) | INTRAMUSCULAR | Status: DC
  Administered 2020-02-15 – 2020-02-18 (×11): 4 mg via INTRAVENOUS
  Filled 2020-02-15 (×13): qty 2

## 2020-02-15 MED ORDER — OXYCODONE HCL 5 MG PO TABS: 5.0000 mg | ORAL_TABLET | ORAL | Status: DC | PRN

## 2020-02-15 MED ORDER — HYDROMORPHONE HCL 1 MG/ML IJ SOLN
1.0000 mg | INTRAMUSCULAR | Status: DC | PRN
  Administered 2020-02-15 – 2020-02-18 (×21): 1 mg via INTRAVENOUS
  Filled 2020-02-15 (×22): qty 1

## 2020-02-15 MED ORDER — TRAMADOL HCL 50 MG PO TABS: 50.0000 mg | ORAL_TABLET | Freq: Four times a day (QID) | ORAL | Status: DC | PRN

## 2020-02-15 MED ORDER — GABAPENTIN 400 MG PO CAPS
400.0000 mg | ORAL_CAPSULE | Freq: Three times a day (TID) | ORAL | Status: DC
  Administered 2020-02-15 – 2020-02-21 (×18): 400 mg via ORAL
  Filled 2020-02-15 (×18): qty 1

## 2020-02-15 MED ORDER — PIPERACILLIN-TAZOBACTAM 3.375 G IVPB
3.3750 g | Freq: Three times a day (TID) | INTRAVENOUS | Status: AC
  Administered 2020-02-15 – 2020-02-17 (×8): 3.375 g via INTRAVENOUS
  Filled 2020-02-15 (×8): qty 50

## 2020-02-15 MED ORDER — PSYLLIUM 95 % PO PACK
1.0000 | PACK | Freq: Two times a day (BID) | ORAL | Status: DC
  Administered 2020-02-18: 1 via ORAL
  Filled 2020-02-15 (×8): qty 1

## 2020-02-15 MED ORDER — ACETAMINOPHEN 325 MG PO TABS
650.0000 mg | ORAL_TABLET | Freq: Four times a day (QID) | ORAL | Status: DC | PRN
  Administered 2020-02-15 – 2020-02-17 (×3): 650 mg via ORAL
  Filled 2020-02-15 (×3): qty 2

## 2020-02-15 NOTE — Consult Note (Addendum)
Patient seen and examined at bedside.  Unfortunately she has developed post ERCP pancreatitis, elevated lipase, pancreatic inflammation with stranding, phlegmon collection measuring 2.2 cm on MRCP.  No stones remain in distal CBD, post sphincterotomy, LFTs are trending down.  At this point it would be the most important to give her IV fluids. She was given lactated Ringer's 1 L bolus yesterday evening. Continue her on IV fluids at 150 cc an hour, unless there is evidence of fluid overload such as tachypnea, hypoxemia or decreased air entry at bilateral bases.  We need to ensure that her renal function is normal.  We will follow her liver enzymes.  She will most likely need repeat MRCP in 4 to 6 weeks for follow-up of pancreatitis and phlegmon collection.  Encouraged early mobilization, patient has been walking around in hallway.  She was nauseous and had about 75 cc of bilious emesis today morning.  She is n.p.o. except small sips of water and ice. Continue Zofran 4 mg IV every 6 hours around-the-clock until nausea and vomiting subsides.  Currently on acetaminophen every 6 hours as needed, hydrocodone 1 to 2 tablet every 4 hours as needed and Dilaudid 1 mg every 2 hours as needed.  Dr. Cristina Gong will be seeing her over the weekend.   Ronnette Juniper, MD   [facility] Gastroenterology Progress Note  Melissa Atkinson 49 y.o. [birth year]  CC:  Pancreatitis  Subjective: Patient reports severe epigastric abdominal pain that radiates to lower abdomen, as well as around to her back.  States pain started last night.  Reports persistent nausea but denies any vomiting.  States she is unable to tolerate oral medication or liquids due to nausea.  Also reports decreased urine output.  ROS : Review of Systems  Gastrointestinal: Positive for abdominal pain and nausea. Negative for blood in stool, constipation, diarrhea, heartburn, melena and vomiting.  Genitourinary: Positive for frequency (decreased).  Negative for flank pain and hematuria.    Objective: Vital signs in last 24 hours: Vitals:   02/15/20 0138 02/15/20 0549  BP: 121/75 (!) 141/73  Pulse: 65 63  Resp: 16 18  Temp: 98.4 F (36.9 C) 98.2 F (36.8 C)  SpO2: 93% 97%    Physical Exam:  General:  Lethargic, appears painful, oriented  Head:  Normocephalic, without obvious abnormality, atraumatic  Eyes:  Anicteric sclera, EOMs intact,   Lungs:   Clear to auscultation bilaterally, respirations unlabored  Heart:  Regular rate and rhythm, S1, S2 normal  Abdomen:   Soft, severely tender in the epigastrium without peritoneal signs, normoactive bowel sounds  Extremities: Extremities normal, atraumatic, no  edema  Pulses: 2+ and symmetric    Lab Results: Recent Labs    02/13/20 0717 02/13/20 0717 02/14/20 0502 02/15/20 0440  NA 138   < > 139 142  K 2.9*   < > 4.4 4.5  CL 102   < > 110 108  CO2 25   < > 23 25  GLUCOSE 119*   < > 142* 117*  BUN 8   < > <5* 7  CREATININE 0.57   < > 0.65 0.75  CALCIUM 9.0   < > 8.7* 8.5*  MG 2.3  --  2.1  --    < > = values in this interval not displayed.   Recent Labs    02/14/20 1519 02/15/20 0440  AST 486* 263*  ALT 1,118* 753*  ALKPHOS 227* 173*  BILITOT 1.0 0.6  PROT 6.9 5.8*  ALBUMIN 3.6 3.2*  Recent Labs    02/14/20 0502 02/15/20 0440  WBC 12.3* 15.9*  HGB 11.9* 11.3*  HCT 36.9 36.5  MCV 89.3 92.4  PLT 142* 168   Recent Labs    02/14/20 0502  LABPROT 13.5  INR 1.1     Assessment/Plan: Post ERCP pancreatitis: Severe epigastric abdominal pain and lipase 4498 today -ERCP with sphincterotomy 02/14/20: the biliary tree was swept but nothing was found; CBD successfully dilated -LFTs trending down: T. Bili 0.6/ AST 263/ ALT 753/ ALP 173 today as compared to T. Bili 1.0/ AST 486/ ALT 1118/ ALP 227 yesterday  Plan: MRCP STAT.  Increase LR to rate of 175 cc/h.  Transition to IV pain medication as patient is unable to tolerate oral pain medication or clear  liquids.  NPO for now.  Scheduled Zofran every 6 due to persistent nausea.  [facility] will follow.  Salley Slaughter PA-C 02/15/2020, 9:51 AM  Contact #  [phone]

## 2020-02-15 NOTE — Plan of Care (Signed)

## 2020-02-15 NOTE — Progress Notes (Signed)
1 Day Post-Op  Subjective: CC: Patient with severe epigastric abdominal pain that radiates around the right side of her abdomen to her flank. Nauseated. Thinks the oxycodone is making this worse.   Objective: Vital signs in last 24 hours: Temp:  [98.1 F (36.7 C)-98.7 F (37.1 C)] 98.2 F (36.8 C) (08/20 0549) Pulse Rate:  [58-69] 63 (08/20 0549) Resp:  [12-19] 18 (08/20 0549) BP: (100-142)/(54-95) 141/73 (08/20 0549) SpO2:  [93 %-100 %] 97 % (08/20 0549) Weight:  [86.1 kg] 86.1 kg (08/19 0935) Last BM Date: 02/12/20  Intake/Output from previous day: 08/19 0701 - 08/20 0700 In: 4214.5 [P.O.:640; I.V.:2367.8; IV Piggyback:1206.7] Out: 2300 [Urine:2300] Intake/Output this shift: No intake/output data recorded.  PE: Gen:  Alert, NAD, pleasant Pulm: Normal rate and effort  Abd: Soft, ND, epigastric and RUQ tenderness without peritonitis. +BS. Laparoscopic incisions c/d/i with tegaderm in place Ext:  No LE edema  Psych: A&Ox3  Skin: no rashes noted, warm and dry  Lab Results:  Recent Labs    02/14/20 0502 02/15/20 0440  WBC 12.3* 15.9*  HGB 11.9* 11.3*  HCT 36.9 36.5  PLT 142* 168   BMET Recent Labs    02/14/20 0502 02/15/20 0440  NA 139 142  K 4.4 4.5  CL 110 108  CO2 23 25  GLUCOSE 142* 117*  BUN <5* 7  CREATININE 0.65 0.75  CALCIUM 8.7* 8.5*   PT/INR Recent Labs    02/14/20 0502  LABPROT 13.5  INR 1.1   CMP     Component Value Date/Time   NA 142 02/15/2020 0440   NA 143 01/02/2019 0844   K 4.5 02/15/2020 0440   CL 108 02/15/2020 0440   CO2 25 02/15/2020 0440   GLUCOSE 117 (H) 02/15/2020 0440   BUN 7 02/15/2020 0440   BUN 15 01/02/2019 0844   CREATININE 0.75 02/15/2020 0440   CREATININE 0.84 02/08/2020 1205   CALCIUM 8.5 (L) 02/15/2020 0440   PROT 5.8 (L) 02/15/2020 0440   PROT 6.9 08/22/2017 0826   ALBUMIN 3.2 (L) 02/15/2020 0440   ALBUMIN 4.4 08/22/2017 0826   AST 263 (H) 02/15/2020 0440   ALT 753 (H) 02/15/2020 0440   ALKPHOS  173 (H) 02/15/2020 0440   BILITOT 0.6 02/15/2020 0440   BILITOT <0.2 08/22/2017 0826   GFRNONAA >60 02/15/2020 0440   GFRNONAA 81 02/08/2020 1205   GFRAA >60 02/15/2020 0440   GFRAA 94 02/08/2020 1205   Lipase     Component Value Date/Time   LIPASE 4,498 (H) 02/15/2020 0440       Studies/Results: DG Cholangiogram Operative  Result Date: 02/13/2020 CLINICAL DATA:  49 year old with cholelithiasis. EXAM: INTRAOPERATIVE CHOLANGIOGRAM TECHNIQUE: Cholangiographic images from the C-arm fluoroscopic device were submitted for interpretation post-operatively. Please see the procedural report for the amount of contrast and the fluoroscopy time utilized. COMPARISON:  Ultrasound 02/09/2020 FINDINGS: Contrast opacification of the common bile duct and intrahepatic bile ducts. Mild dilatation of the extrahepatic biliary system. There is no drainage into the duodenum. Mobile filling defects in the biliary system likely represent gas. A small persistent filling defect in the distal common bile duct may represent a small stone. IMPRESSION: Obstruction of the distal common bile duct, likely from a stone. Electronically Signed   By: Markus Daft M.D.   On: 02/13/2020 13:03   DG ERCP BILIARY & PANCREATIC DUCTS  Result Date: 02/15/2020 CLINICAL DATA:  Cholelithiasis, post cholecystectomy with CBD obstruction EXAM: ERCP TECHNIQUE: Multiple spot images obtained with the  fluoroscopic device and submitted for interpretation post-procedure. COMPARISON:  Intra op cholangiogram from previous day FINDINGS: Series of fluoroscopic spot images document endoscopic cannulation of the pancreatic duct and CBD with guidewires, subsequent partial opacification of the CBD and passage of balloon catheters through the distal CBD. No significant opacification of the intrahepatic biliary tree. IMPRESSION: Endoscopic CBD cannulation and intervention as above These images were submitted for radiologic interpretation only. Please see the  procedural report for the amount of contrast and the fluoroscopy time utilized. Electronically Signed   By: Lucrezia Europe M.D.   On: 02/15/2020 08:11    Anti-infectives: Anti-infectives (From admission, onward)   Start     Dose/Rate Route Frequency Ordered Stop   02/13/20 0815  cefTRIAXone (ROCEPHIN) 2 g in sodium chloride 0.9 % 100 mL IVPB  Status:  Discontinued       "And" Linked Group Details   2 g 200 mL/hr over 30 Minutes Intravenous On call to O.R. 02/13/20 0804 02/13/20 1312   02/13/20 0815  metroNIDAZOLE (FLAGYL) IVPB 500 mg       "And" Linked Group Details   500 mg 100 mL/hr over 60 Minutes Intravenous On call to O.R. 02/13/20 0804 02/13/20 1040   02/13/20 0600  ceFAZolin (ANCEF) IVPB 2g/100 mL premix  Status:  Discontinued        2 g 200 mL/hr over 30 Minutes Intravenous On call to O.R. 02/13/20 4854 02/13/20 0804   02/13/20 0445  cefTRIAXone (ROCEPHIN) 2 g in sodium chloride 0.9 % 100 mL IVPB        2 g 200 mL/hr over 30 Minutes Intravenous Every 24 hours 02/13/20 0434         Assessment/Plan Acute on Chronic Cholecystitis Choledocolithiasis  Pancreatitis, suspect 2/2 ERCP - s/p Laparoscopic Cholecystectomy with IOC and Core liver bx by Dr. Johney Maine on 02/13/20 - POD #2 - s/p ERCP by Dr. Ronnette Juniper on 8/19 - ERCP impression reads "biliary sphincterotomy was performed. The biliary tree was swept and nothing was found. Common bile duct was successfully dilated". . - Lipase uptrending. Pain control, IVF, NPO - LFT's downtredning. GI getting MRCP - Mobilize, Pulm toilet   FEN - NPO, IVF VTE - SCDs, Lovenox  ID - Flagyl 8/18. Rocephin 8/18 >>   LOS: 2 days    Jillyn Ledger , [facility] 02/15/2020, 8:56 AM Please see Amion for pager number during day hours 7:00am-4:30pm

## 2020-02-16 LAB — COMPREHENSIVE METABOLIC PANEL
ALT: 472 U/L — ABNORMAL HIGH (ref 0–44)
AST: 88 U/L — ABNORMAL HIGH (ref 15–41)
Albumin: 3 g/dL — ABNORMAL LOW (ref 3.5–5.0)
Alkaline Phosphatase: 140 U/L — ABNORMAL HIGH (ref 38–126)
Anion gap: 7 (ref 5–15)
BUN: 6 mg/dL (ref 6–20)
CO2: 25 mmol/L (ref 22–32)
Calcium: 7.9 mg/dL — ABNORMAL LOW (ref 8.9–10.3)
Chloride: 105 mmol/L (ref 98–111)
Creatinine, Ser: 0.77 mg/dL (ref 0.44–1.00)
GFR calc Af Amer: >60 mL/min (ref >60)
GFR calc non Af Amer: >60 mL/min (ref >60)
Glucose, Bld: 89 mg/dL (ref 70–99)
Potassium: 4.5 mmol/L (ref 3.5–5.1)
Sodium: 137 mmol/L (ref 135–145)
Total Bilirubin: 0.9 mg/dL (ref 0.3–1.2)
Total Protein: 5.6 g/dL — ABNORMAL LOW (ref 6.5–8.1)

## 2020-02-16 LAB — CBC
HCT: 34.3 % — ABNORMAL LOW (ref 36.0–46.0)
Hemoglobin: 10.7 g/dL — ABNORMAL LOW (ref 12.0–15.0)
MCH: 28.8 pg (ref 26.0–34.0)
MCHC: 31.2 g/dL (ref 30.0–36.0)
MCV: 92.2 fL (ref 80.0–100.0)
Platelets: 143 10*3/uL — ABNORMAL LOW (ref 150–400)
RBC: 3.72 MIL/uL — ABNORMAL LOW (ref 3.87–5.11)
RDW: 14.4 % (ref 11.5–15.5)
WBC: 18.1 10*3/uL — ABNORMAL HIGH (ref 4.0–10.5)
nRBC: 0 % (ref 0.0–0.2)

## 2020-02-16 LAB — LIPASE, BLOOD: Lipase: 1021 U/L — ABNORMAL HIGH (ref 11–51)

## 2020-02-16 MED ORDER — BISACODYL 10 MG RE SUPP
10.0000 mg | Freq: Once | RECTAL | Status: AC
  Administered 2020-02-16: 10 mg via RECTAL
  Filled 2020-02-16: qty 1

## 2020-02-16 MED ORDER — SACCHAROMYCES BOULARDII 250 MG PO CAPS
250.0000 mg | ORAL_CAPSULE | Freq: Two times a day (BID) | ORAL | Status: DC
  Administered 2020-02-16 – 2020-02-21 (×10): 250 mg via ORAL
  Filled 2020-02-16 (×11): qty 1

## 2020-02-16 NOTE — Progress Notes (Signed)
2 Days Post-Op  Subjective: CC: Patient with severe epigastric abdominal pain that radiates around the right side of her abdomen to her flank. Nauseated. Feels like it's getting worse.   Objective: Vital signs in last 24 hours: Temp:  [97.6 F (36.4 C)-98.6 F (37 C)] 98.6 F (37 C) (08/21 0532) Pulse Rate:  [68-92] 92 (08/21 0532) Resp:  [16] 16 (08/21 0532) BP: (98-127)/(59-87) 114/77 (08/21 0532) SpO2:  [88 %-97 %] 88 % (08/21 0532) Last BM Date: 02/12/20  Intake/Output from previous day: 08/20 0701 - 08/21 0700 In: 2016 [I.V.:1757.4; IV Piggyback:258.6] Out: 1125 [Urine:1025; Emesis/NG output:100] Intake/Output this shift: Total I/O In: 0  Out: 400 [Urine:400]  PE: Gen:  Alert, NAD, pleasant Pulm: Normal rate and effort  Abd: Soft, epigastric and RUQ tenderness without peritonitis.  Laparoscopic incisions c/d/i with tegaderm in place Ext:  No LE edema  Psych: A&Ox3  Skin: no rashes noted, warm and dry  Lab Results:  Recent Labs    02/14/20 0502 02/15/20 0440  WBC 12.3* 15.9*  HGB 11.9* 11.3*  HCT 36.9 36.5  PLT 142* 168   BMET Recent Labs    02/14/20 0502 02/15/20 0440  NA 139 142  K 4.4 4.5  CL 110 108  CO2 23 25  GLUCOSE 142* 117*  BUN <5* 7  CREATININE 0.65 0.75  CALCIUM 8.7* 8.5*   PT/INR Recent Labs    02/14/20 0502  LABPROT 13.5  INR 1.1   CMP     Component Value Date/Time   NA 142 02/15/2020 0440   NA 143 01/02/2019 0844   K 4.5 02/15/2020 0440   CL 108 02/15/2020 0440   CO2 25 02/15/2020 0440   GLUCOSE 117 (H) 02/15/2020 0440   BUN 7 02/15/2020 0440   BUN 15 01/02/2019 0844   CREATININE 0.75 02/15/2020 0440   CREATININE 0.84 02/08/2020 1205   CALCIUM 8.5 (L) 02/15/2020 0440   PROT 5.8 (L) 02/15/2020 0440   PROT 6.9 08/22/2017 0826   ALBUMIN 3.2 (L) 02/15/2020 0440   ALBUMIN 4.4 08/22/2017 0826   AST 263 (H) 02/15/2020 0440   ALT 753 (H) 02/15/2020 0440   ALKPHOS 173 (H) 02/15/2020 0440   BILITOT 0.6 02/15/2020  0440   BILITOT <0.2 08/22/2017 0826   GFRNONAA >60 02/15/2020 0440   GFRNONAA 81 02/08/2020 1205   GFRAA >60 02/15/2020 0440   GFRAA 94 02/08/2020 1205   Lipase     Component Value Date/Time   LIPASE 4,498 (H) 02/15/2020 0440       Studies/Results: MR 3D Recon At Scanner  Result Date: 02/15/2020 CLINICAL DATA:  Severe acute abdominal pain, status post laparoscopic cholecystectomy and liver biopsy, acute pancreatitis EXAM: MRI ABDOMEN WITHOUT AND WITH CONTRAST (INCLUDING MRCP) TECHNIQUE: Multiplanar multisequence MR imaging of the abdomen was performed both before and after the administration of intravenous contrast. Heavily T2-weighted images of the biliary and pancreatic ducts were obtained, and three-dimensional MRCP images were rendered by post processing. CONTRAST:  27m GADAVIST GADOBUTROL 1 MMOL/ML IV SOLN COMPARISON:  Abdominal ultrasound dated 02/09/2020 FINDINGS: Motion degraded images. Lower chest: Trace bilateral pleural effusions. Patchy bilateral lower lobe opacities, right greater than left, likely atelectasis. Hepatobiliary: Liver is within normal limits. No suspicious/enhancing hepatic lesions. No hepatic steatosis. Status post cholecystectomy. Trace fluid with susceptibility artifact in the surgical bed, reflecting postoperative changes, without fluid collection/abscess. No intrahepatic ductal dilatation. Common duct measures 10 mm and smoothly tapers at the ampulla, likely postsurgical. Mild debris in the distal  CBD (series 6/image 15). Pancreas: Peripancreatic inflammatory changes along the pancreatic body/tail, reflecting acute pancreatitis. Focal lesion along the posterior aspect of the pancreatic uncinate process (series 201/image 67), at the junction with the medial aspect of the descending duodenum, measuring approximately 1.5 x 2.2 cm (series 16/image 28). Associated restricted diffusion but no convincing enhancement on postcontrast subtraction imaging. Mildly heterogeneous  T1 appearance, indicating internal complexity, but dark on T2. No associated pancreatic ductal dilatation. Overall, this favors a complex acute peripancreatic collection/phlegmon along the pancreaticoduodenal groove. Pancreatic neoplasm would be difficult to exclude but is unlikely. Spleen:  Within normal limits. Adrenals/Urinary Tract:  Adrenal glands are within normal limits. 13 mm left upper pole renal cyst. Right kidney is within normal limits. No hydronephrosis. Stomach/Bowel: Stomach is within normal limits. Visualized bowel is unremarkable. Vascular/Lymphatic:  No evidence of abdominal aortic aneurysm. No suspicious abdominal lymphadenopathy. Other: Mild abdominal/mesenteric fluid related to acute pancreatitis, as above. Musculoskeletal: No focal osseous lesions. IMPRESSION: Acute pancreatitis. Associated focal lesion along the pancreaticoduodenal groove favors a complex acute peripancreatic collection/phlegmon, measuring to 2.2 cm. Follow-up CT or MRI abdomen with/without contrast is suggested in 4-6 weeks. Status post cholecystectomy without drainable fluid collection/abscess in the surgical bed. No intrahepatic ductal dilatation. Common duct measures 10 mm, likely postsurgical. Mild debris in the distal CBD status post ERCP with sphincterotomy. Electronically Signed   By: Julian Hy M.D.   On: 02/15/2020 10:32   DG ERCP BILIARY & PANCREATIC DUCTS  Result Date: 02/15/2020 CLINICAL DATA:  Cholelithiasis, post cholecystectomy with CBD obstruction EXAM: ERCP TECHNIQUE: Multiple spot images obtained with the fluoroscopic device and submitted for interpretation post-procedure. COMPARISON:  Intra op cholangiogram from previous day FINDINGS: Series of fluoroscopic spot images document endoscopic cannulation of the pancreatic duct and CBD with guidewires, subsequent partial opacification of the CBD and passage of balloon catheters through the distal CBD. No significant opacification of the intrahepatic  biliary tree. IMPRESSION: Endoscopic CBD cannulation and intervention as above These images were submitted for radiologic interpretation only. Please see the procedural report for the amount of contrast and the fluoroscopy time utilized. Electronically Signed   By: Lucrezia Europe M.D.   On: 02/15/2020 08:11   MR ABDOMEN MRCP W WO CONTAST  Result Date: 02/15/2020 CLINICAL DATA:  Severe acute abdominal pain, status post laparoscopic cholecystectomy and liver biopsy, acute pancreatitis EXAM: MRI ABDOMEN WITHOUT AND WITH CONTRAST (INCLUDING MRCP) TECHNIQUE: Multiplanar multisequence MR imaging of the abdomen was performed both before and after the administration of intravenous contrast. Heavily T2-weighted images of the biliary and pancreatic ducts were obtained, and three-dimensional MRCP images were rendered by post processing. CONTRAST:  25m GADAVIST GADOBUTROL 1 MMOL/ML IV SOLN COMPARISON:  Abdominal ultrasound dated 02/09/2020 FINDINGS: Motion degraded images. Lower chest: Trace bilateral pleural effusions. Patchy bilateral lower lobe opacities, right greater than left, likely atelectasis. Hepatobiliary: Liver is within normal limits. No suspicious/enhancing hepatic lesions. No hepatic steatosis. Status post cholecystectomy. Trace fluid with susceptibility artifact in the surgical bed, reflecting postoperative changes, without fluid collection/abscess. No intrahepatic ductal dilatation. Common duct measures 10 mm and smoothly tapers at the ampulla, likely postsurgical. Mild debris in the distal CBD (series 6/image 15). Pancreas: Peripancreatic inflammatory changes along the pancreatic body/tail, reflecting acute pancreatitis. Focal lesion along the posterior aspect of the pancreatic uncinate process (series 201/image 67), at the junction with the medial aspect of the descending duodenum, measuring approximately 1.5 x 2.2 cm (series 16/image 28). Associated restricted diffusion but no convincing enhancement on  postcontrast subtraction  imaging. Mildly heterogeneous T1 appearance, indicating internal complexity, but dark on T2. No associated pancreatic ductal dilatation. Overall, this favors a complex acute peripancreatic collection/phlegmon along the pancreaticoduodenal groove. Pancreatic neoplasm would be difficult to exclude but is unlikely. Spleen:  Within normal limits. Adrenals/Urinary Tract:  Adrenal glands are within normal limits. 13 mm left upper pole renal cyst. Right kidney is within normal limits. No hydronephrosis. Stomach/Bowel: Stomach is within normal limits. Visualized bowel is unremarkable. Vascular/Lymphatic:  No evidence of abdominal aortic aneurysm. No suspicious abdominal lymphadenopathy. Other: Mild abdominal/mesenteric fluid related to acute pancreatitis, as above. Musculoskeletal: No focal osseous lesions. IMPRESSION: Acute pancreatitis. Associated focal lesion along the pancreaticoduodenal groove favors a complex acute peripancreatic collection/phlegmon, measuring to 2.2 cm. Follow-up CT or MRI abdomen with/without contrast is suggested in 4-6 weeks. Status post cholecystectomy without drainable fluid collection/abscess in the surgical bed. No intrahepatic ductal dilatation. Common duct measures 10 mm, likely postsurgical. Mild debris in the distal CBD status post ERCP with sphincterotomy. Electronically Signed   By: Julian Hy M.D.   On: 02/15/2020 10:32    Anti-infectives: Anti-infectives (From admission, onward)   Start     Dose/Rate Route Frequency Ordered Stop   02/15/20 1400  piperacillin-tazobactam (ZOSYN) IVPB 3.375 g  Status:  Discontinued        3.375 g 100 mL/hr over 30 Minutes Intravenous Every 8 hours 02/15/20 1117 02/15/20 1122   02/15/20 1400  piperacillin-tazobactam (ZOSYN) IVPB 3.375 g        3.375 g 12.5 mL/hr over 240 Minutes Intravenous Every 8 hours 02/15/20 1122 02/17/20 2359   02/13/20 0815  cefTRIAXone (ROCEPHIN) 2 g in sodium chloride 0.9 % 100 mL IVPB   Status:  Discontinued       "And" Linked Group Details   2 g 200 mL/hr over 30 Minutes Intravenous On call to O.R. 02/13/20 0804 02/13/20 1312   02/13/20 0815  metroNIDAZOLE (FLAGYL) IVPB 500 mg       "And" Linked Group Details   500 mg 100 mL/hr over 60 Minutes Intravenous On call to O.R. 02/13/20 0804 02/13/20 1040   02/13/20 0600  ceFAZolin (ANCEF) IVPB 2g/100 mL premix  Status:  Discontinued        2 g 200 mL/hr over 30 Minutes Intravenous On call to O.R. 02/13/20 8938 02/13/20 0804   02/13/20 0445  cefTRIAXone (ROCEPHIN) 2 g in sodium chloride 0.9 % 100 mL IVPB  Status:  Discontinued        2 g 200 mL/hr over 30 Minutes Intravenous Every 24 hours 02/13/20 0434 02/15/20 1117       Assessment/Plan Acute on Chronic Cholecystitis Choledocolithiasis  Pancreatitis, suspect 2/2 ERCP - s/p Laparoscopic Cholecystectomy with IOC and Core liver bx by Dr. Johney Maine on 02/13/20 - POD #3 - s/p ERCP by Dr. Ronnette Juniper on 8/19 - ERCP impression reads "biliary sphincterotomy was performed. The biliary tree was swept and nothing was found. Common bile duct was successfully dilated". . - Recheck labs this AM. Pain control, IVF, NPO -  MRCP shows no residual stones with phlegmon - Mobilize, Pulm toilet   FEN - NPO, IVF VTE - SCDs, Lovenox  ID - Flagyl 8/18. Rocephin 8/18 >>   LOS: 3 days    Rosario Adie , [facility] 02/16/2020, 8:32 AM Please see Amion for pager number during day hours 7:00am-4:30pm

## 2020-02-16 NOTE — Progress Notes (Signed)
Patient is 2 days status post ERCP with sphincterotomy and stone extraction, complicated by post ERCP pancreatitis of moderate severity, CT confirmed, lipase 4500 yesterday.  The patient indicates that, compared to yesterday, she is basically unchanged. She continues to have significant pain requiring use of IV Dilaudid (oral pain medication causes her to get sick to her stomach). No fever. She is getting some puffiness of her extremities in association with fairly aggressive IV fluids.  On exam, the patient has stable vital signs. She is sitting up in the bedside chair in no distress, although slightly tearful and a little bit anxious about her prognosis. She is anicteric. The chest is clear to the bases, no evidence of pleural effusion or significant rales. Heart normal, no murmur or tachycardia. Abdomen is quiet, somewhat adipose, difficult to assess in terms of distention because of adiposity and the fact that she was sitting in a chair.  Labs: Consistent with evolving pancreatitis, with slight rise in white count to 18,100, and slight drop in hemoglobin from 11.3 to 10.7 (which is a good prognostic indicator, and suggests that IV hydration is keeping up with third spacing). Repeat lipase level pending.  Impression: Moderately severe evolving pancreatitis, so far without evidence of overt complications.  Plan: Continue current management. Will add probiotic in view of antibiotic therapy in the hospital setting, to try to mitigate any antibiotic associated diarrhea and potentially prevent C. difficile colitis. Discussed case with Dr. Marcello Moores of surgery; we agree to hold off on attempts at nutrition for the time being. Lengthy discussion with patient regarding typical clinical evolution and findings with acute pancreatitis.  Cleotis Nipper, M.D. Pager [phone] If no answer or after 5 PM call [phone]

## 2020-02-17 LAB — COMPREHENSIVE METABOLIC PANEL
ALT: 299 U/L — ABNORMAL HIGH (ref 0–44)
AST: 41 U/L (ref 15–41)
Albumin: 2.8 g/dL — ABNORMAL LOW (ref 3.5–5.0)
Alkaline Phosphatase: 115 U/L (ref 38–126)
Anion gap: 10 (ref 5–15)
BUN: 6 mg/dL (ref 6–20)
CO2: 24 mmol/L (ref 22–32)
Calcium: 8.3 mg/dL — ABNORMAL LOW (ref 8.9–10.3)
Chloride: 103 mmol/L (ref 98–111)
Creatinine, Ser: 0.69 mg/dL (ref 0.44–1.00)
GFR calc Af Amer: >60 mL/min (ref >60)
GFR calc non Af Amer: >60 mL/min (ref >60)
Glucose, Bld: 86 mg/dL (ref 70–99)
Potassium: 3.6 mmol/L (ref 3.5–5.1)
Sodium: 137 mmol/L (ref 135–145)
Total Bilirubin: 1.1 mg/dL (ref 0.3–1.2)
Total Protein: 5.8 g/dL — ABNORMAL LOW (ref 6.5–8.1)

## 2020-02-17 LAB — CBC
HCT: 32.7 % — ABNORMAL LOW (ref 36.0–46.0)
Hemoglobin: 10.2 g/dL — ABNORMAL LOW (ref 12.0–15.0)
MCH: 28.7 pg (ref 26.0–34.0)
MCHC: 31.2 g/dL (ref 30.0–36.0)
MCV: 92.1 fL (ref 80.0–100.0)
Platelets: 142 10*3/uL — ABNORMAL LOW (ref 150–400)
RBC: 3.55 MIL/uL — ABNORMAL LOW (ref 3.87–5.11)
RDW: 14 % (ref 11.5–15.5)
WBC: 19.1 10*3/uL — ABNORMAL HIGH (ref 4.0–10.5)
nRBC: 0 % (ref 0.0–0.2)

## 2020-02-17 LAB — LIPASE, BLOOD: Lipase: 103 U/L — ABNORMAL HIGH (ref 11–51)

## 2020-02-17 NOTE — Progress Notes (Signed)
Problem: Post ERCP is pancreatitis, now 3 days status post ERCP with sphincterotomy and stone extraction.     Vital signs are normal.  The patient's labs show progressive improvement.  Over the past 48 hours, her lipase has dropped from 4500 to 1000 to 103 this morning.  Her white count continues to go up gradually, rising from 18.1 yesterday to 19.1 today.  Similarly, her hemoglobin has drifted slightly further downward, from 10.7 yesterday to 10.2 this morning.  Liver chemistries are markedly improved, on the order of 70 to 90% reduction compared to 3 days ago.  Nonetheless, ALT is still elevated to 299, bilirubin is 1.1.  Despite this biochemical improvement, however, the patient thinks that yesterday was worse than the day before.  She actually thought she was doing pretty well at first this morning, but now she is starting to hurt again.  She feels like there is a tight belt around her abdomen.  She is somewhat discouraged.  She has not had any flatus or bowel movements, although she can feel activity in her abdomen.  On exam, vital signs are normal.  The patient is in bed, sitting up, no acute distress.  Active bowel sounds are present.  Interestingly, the abdomen is not particularly tender to exam.  Impression: Post ERCP pancreatitis which is improving biochemically and without apparent complication so far, but with significant residual symptoms.  Plan: Continue current management.  Monitor labs.  Consider reduction in IV fluids once the patient is more urine, probably tomorrow.  Melissa Atkinson, M.D. Pager [phone] If no answer or after 5 PM call [phone]

## 2020-02-17 NOTE — Progress Notes (Signed)
3 Days Post-Op  Subjective: CC: Patient with less epigastric abdominal pain.  Less nauseated. Feels a bit better  Objective: Vital signs in last 24 hours: Temp:  [98.4 F (36.9 C)-99.1 F (37.3 C)] 99.1 F (37.3 C) (08/22 0528) Pulse Rate:  [81-84] 84 (08/22 0528) Resp:  [16-18] 17 (08/22 0528) BP: (105-120)/(76-84) 120/84 (08/22 0528) SpO2:  [92 %-97 %] 92 % (08/22 0528) Last BM Date: 02/12/20  Intake/Output from previous day: 08/21 0701 - 08/22 0700 In: 3657.2 [I.V.:3333; IV Piggyback:324.2] Out: 2900 [Urine:2900] Intake/Output this shift: No intake/output data recorded.  PE: Gen:  Alert, NAD, pleasant Pulm: Normal rate and effort  Abd: Soft, epigastric and RUQ tenderness without peritonitis.  Laparoscopic incisions c/d/i  Ext:  1+ edema  Psych: A&Ox3  Skin: no rashes noted, warm and dry  Lab Results:  Recent Labs    02/16/20 0824 02/17/20 0600  WBC 18.1* 19.1*  HGB 10.7* 10.2*  HCT 34.3* 32.7*  PLT 143* 142*   BMET Recent Labs    02/16/20 0824 02/17/20 0600  NA 137 137  K 4.5 3.6  CL 105 103  CO2 25 24  GLUCOSE 89 86  BUN 6 6  CREATININE 0.77 0.69  CALCIUM 7.9* 8.3*   PT/INR No results for input(s): LABPROT, INR in the last 72 hours. CMP     Component Value Date/Time   NA 137 02/17/2020 0600   NA 143 01/02/2019 0844   K 3.6 02/17/2020 0600   CL 103 02/17/2020 0600   CO2 24 02/17/2020 0600   GLUCOSE 86 02/17/2020 0600   BUN 6 02/17/2020 0600   BUN 15 01/02/2019 0844   CREATININE 0.69 02/17/2020 0600   CREATININE 0.84 02/08/2020 1205   CALCIUM 8.3 (L) 02/17/2020 0600   PROT 5.8 (L) 02/17/2020 0600   PROT 6.9 08/22/2017 0826   ALBUMIN 2.8 (L) 02/17/2020 0600   ALBUMIN 4.4 08/22/2017 0826   AST 41 02/17/2020 0600   ALT 299 (H) 02/17/2020 0600   ALKPHOS 115 02/17/2020 0600   BILITOT 1.1 02/17/2020 0600   BILITOT <0.2 08/22/2017 0826   GFRNONAA >60 02/17/2020 0600   GFRNONAA 81 02/08/2020 1205   GFRAA >60 02/17/2020 0600   GFRAA  94 02/08/2020 1205   Lipase     Component Value Date/Time   LIPASE 103 (H) 02/17/2020 0600       Studies/Results: MR 3D Recon At Scanner  Result Date: 02/15/2020 CLINICAL DATA:  Severe acute abdominal pain, status post laparoscopic cholecystectomy and liver biopsy, acute pancreatitis EXAM: MRI ABDOMEN WITHOUT AND WITH CONTRAST (INCLUDING MRCP) TECHNIQUE: Multiplanar multisequence MR imaging of the abdomen was performed both before and after the administration of intravenous contrast. Heavily T2-weighted images of the biliary and pancreatic ducts were obtained, and three-dimensional MRCP images were rendered by post processing. CONTRAST:  53m GADAVIST GADOBUTROL 1 MMOL/ML IV SOLN COMPARISON:  Abdominal ultrasound dated 02/09/2020 FINDINGS: Motion degraded images. Lower chest: Trace bilateral pleural effusions. Patchy bilateral lower lobe opacities, right greater than left, likely atelectasis. Hepatobiliary: Liver is within normal limits. No suspicious/enhancing hepatic lesions. No hepatic steatosis. Status post cholecystectomy. Trace fluid with susceptibility artifact in the surgical bed, reflecting postoperative changes, without fluid collection/abscess. No intrahepatic ductal dilatation. Common duct measures 10 mm and smoothly tapers at the ampulla, likely postsurgical. Mild debris in the distal CBD (series 6/image 15). Pancreas: Peripancreatic inflammatory changes along the pancreatic body/tail, reflecting acute pancreatitis. Focal lesion along the posterior aspect of the pancreatic uncinate process (series 201/image 67),  at the junction with the medial aspect of the descending duodenum, measuring approximately 1.5 x 2.2 cm (series 16/image 28). Associated restricted diffusion but no convincing enhancement on postcontrast subtraction imaging. Mildly heterogeneous T1 appearance, indicating internal complexity, but dark on T2. No associated pancreatic ductal dilatation. Overall, this favors a complex  acute peripancreatic collection/phlegmon along the pancreaticoduodenal groove. Pancreatic neoplasm would be difficult to exclude but is unlikely. Spleen:  Within normal limits. Adrenals/Urinary Tract:  Adrenal glands are within normal limits. 13 mm left upper pole renal cyst. Right kidney is within normal limits. No hydronephrosis. Stomach/Bowel: Stomach is within normal limits. Visualized bowel is unremarkable. Vascular/Lymphatic:  No evidence of abdominal aortic aneurysm. No suspicious abdominal lymphadenopathy. Other: Mild abdominal/mesenteric fluid related to acute pancreatitis, as above. Musculoskeletal: No focal osseous lesions. IMPRESSION: Acute pancreatitis. Associated focal lesion along the pancreaticoduodenal groove favors a complex acute peripancreatic collection/phlegmon, measuring to 2.2 cm. Follow-up CT or MRI abdomen with/without contrast is suggested in 4-6 weeks. Status post cholecystectomy without drainable fluid collection/abscess in the surgical bed. No intrahepatic ductal dilatation. Common duct measures 10 mm, likely postsurgical. Mild debris in the distal CBD status post ERCP with sphincterotomy. Electronically Signed   By: Julian Hy M.D.   On: 02/15/2020 10:32   MR ABDOMEN MRCP W WO CONTAST  Result Date: 02/15/2020 CLINICAL DATA:  Severe acute abdominal pain, status post laparoscopic cholecystectomy and liver biopsy, acute pancreatitis EXAM: MRI ABDOMEN WITHOUT AND WITH CONTRAST (INCLUDING MRCP) TECHNIQUE: Multiplanar multisequence MR imaging of the abdomen was performed both before and after the administration of intravenous contrast. Heavily T2-weighted images of the biliary and pancreatic ducts were obtained, and three-dimensional MRCP images were rendered by post processing. CONTRAST:  82m GADAVIST GADOBUTROL 1 MMOL/ML IV SOLN COMPARISON:  Abdominal ultrasound dated 02/09/2020 FINDINGS: Motion degraded images. Lower chest: Trace bilateral pleural effusions. Patchy bilateral  lower lobe opacities, right greater than left, likely atelectasis. Hepatobiliary: Liver is within normal limits. No suspicious/enhancing hepatic lesions. No hepatic steatosis. Status post cholecystectomy. Trace fluid with susceptibility artifact in the surgical bed, reflecting postoperative changes, without fluid collection/abscess. No intrahepatic ductal dilatation. Common duct measures 10 mm and smoothly tapers at the ampulla, likely postsurgical. Mild debris in the distal CBD (series 6/image 15). Pancreas: Peripancreatic inflammatory changes along the pancreatic body/tail, reflecting acute pancreatitis. Focal lesion along the posterior aspect of the pancreatic uncinate process (series 201/image 67), at the junction with the medial aspect of the descending duodenum, measuring approximately 1.5 x 2.2 cm (series 16/image 28). Associated restricted diffusion but no convincing enhancement on postcontrast subtraction imaging. Mildly heterogeneous T1 appearance, indicating internal complexity, but dark on T2. No associated pancreatic ductal dilatation. Overall, this favors a complex acute peripancreatic collection/phlegmon along the pancreaticoduodenal groove. Pancreatic neoplasm would be difficult to exclude but is unlikely. Spleen:  Within normal limits. Adrenals/Urinary Tract:  Adrenal glands are within normal limits. 13 mm left upper pole renal cyst. Right kidney is within normal limits. No hydronephrosis. Stomach/Bowel: Stomach is within normal limits. Visualized bowel is unremarkable. Vascular/Lymphatic:  No evidence of abdominal aortic aneurysm. No suspicious abdominal lymphadenopathy. Other: Mild abdominal/mesenteric fluid related to acute pancreatitis, as above. Musculoskeletal: No focal osseous lesions. IMPRESSION: Acute pancreatitis. Associated focal lesion along the pancreaticoduodenal groove favors a complex acute peripancreatic collection/phlegmon, measuring to 2.2 cm. Follow-up CT or MRI abdomen  with/without contrast is suggested in 4-6 weeks. Status post cholecystectomy without drainable fluid collection/abscess in the surgical bed. No intrahepatic ductal dilatation. Common duct measures 10 mm, likely postsurgical.  Mild debris in the distal CBD status post ERCP with sphincterotomy. Electronically Signed   By: Julian Hy M.D.   On: 02/15/2020 10:32    Anti-infectives: Anti-infectives (From admission, onward)   Start     Dose/Rate Route Frequency Ordered Stop   02/15/20 1400  piperacillin-tazobactam (ZOSYN) IVPB 3.375 g  Status:  Discontinued        3.375 g 100 mL/hr over 30 Minutes Intravenous Every 8 hours 02/15/20 1117 02/15/20 1122   02/15/20 1400  piperacillin-tazobactam (ZOSYN) IVPB 3.375 g        3.375 g 12.5 mL/hr over 240 Minutes Intravenous Every 8 hours 02/15/20 1122 02/17/20 2359   02/13/20 0815  cefTRIAXone (ROCEPHIN) 2 g in sodium chloride 0.9 % 100 mL IVPB  Status:  Discontinued       "And" Linked Group Details   2 g 200 mL/hr over 30 Minutes Intravenous On call to O.R. 02/13/20 0804 02/13/20 1312   02/13/20 0815  metroNIDAZOLE (FLAGYL) IVPB 500 mg       "And" Linked Group Details   500 mg 100 mL/hr over 60 Minutes Intravenous On call to O.R. 02/13/20 0804 02/13/20 1040   02/13/20 0600  ceFAZolin (ANCEF) IVPB 2g/100 mL premix  Status:  Discontinued        2 g 200 mL/hr over 30 Minutes Intravenous On call to O.R. 02/13/20 6789 02/13/20 0804   02/13/20 0445  cefTRIAXone (ROCEPHIN) 2 g in sodium chloride 0.9 % 100 mL IVPB  Status:  Discontinued        2 g 200 mL/hr over 30 Minutes Intravenous Every 24 hours 02/13/20 0434 02/15/20 1117       Assessment/Plan Acute on Chronic Cholecystitis Choledocolithiasis  Pancreatitis, suspect 2/2 ERCP - s/p Laparoscopic Cholecystectomy with IOC and Core liver bx by Dr. Johney Maine on 02/13/20 - POD #4 - s/p ERCP by Dr. Ronnette Juniper on 8/19 - ERCP impression reads "biliary sphincterotomy was performed. The biliary tree was swept  and nothing was found. Common bile duct was successfully dilated". . - Recheck labs this AM. Pain control, IVF, NPO -  MRCP shows no residual stones with phlegmon - Mobilize, Pulm toilet   FEN - NPO, IVF VTE - SCDs, Lovenox  ID - Flagyl 8/18. Rocephin 8/18>Zosyn 8/20 >>   LOS: 4 days    Melissa Atkinson , MD [facility] 02/17/2020, 8:47 AM Please see Amion for pager number during day hours 7:00am-4:30pm

## 2020-02-18 ENCOUNTER — Ambulatory Visit (HOSPITAL_COMMUNITY): Admission: RE | Admit: 2020-02-18 | Payer: 59 | Source: Home / Self Care | Admitting: Surgery

## 2020-02-18 LAB — COMPREHENSIVE METABOLIC PANEL
ALT: 217 U/L — ABNORMAL HIGH (ref 0–44)
AST: 24 U/L (ref 15–41)
Albumin: 2.5 g/dL — ABNORMAL LOW (ref 3.5–5.0)
Alkaline Phosphatase: 121 U/L (ref 38–126)
Anion gap: 10 (ref 5–15)
BUN: <5 mg/dL — ABNORMAL LOW (ref 6–20)
CO2: 26 mmol/L (ref 22–32)
Calcium: 8 mg/dL — ABNORMAL LOW (ref 8.9–10.3)
Chloride: 101 mmol/L (ref 98–111)
Creatinine, Ser: 0.73 mg/dL (ref 0.44–1.00)
GFR calc Af Amer: >60 mL/min (ref >60)
GFR calc non Af Amer: >60 mL/min (ref >60)
Glucose, Bld: 86 mg/dL (ref 70–99)
Potassium: 3.5 mmol/L (ref 3.5–5.1)
Sodium: 137 mmol/L (ref 135–145)
Total Bilirubin: 0.8 mg/dL (ref 0.3–1.2)
Total Protein: 5.7 g/dL — ABNORMAL LOW (ref 6.5–8.1)

## 2020-02-18 LAB — CBC
HCT: 29.9 % — ABNORMAL LOW (ref 36.0–46.0)
Hemoglobin: 9.5 g/dL — ABNORMAL LOW (ref 12.0–15.0)
MCH: 28.8 pg (ref 26.0–34.0)
MCHC: 31.8 g/dL (ref 30.0–36.0)
MCV: 90.6 fL (ref 80.0–100.0)
Platelets: 162 10*3/uL (ref 150–400)
RBC: 3.3 MIL/uL — ABNORMAL LOW (ref 3.87–5.11)
RDW: 13.9 % (ref 11.5–15.5)
WBC: 16.7 10*3/uL — ABNORMAL HIGH (ref 4.0–10.5)
nRBC: 0 % (ref 0.0–0.2)

## 2020-02-18 LAB — LIPASE, BLOOD: Lipase: 33 U/L (ref 11–51)

## 2020-02-18 SURGERY — LAPAROSCOPIC CHOLECYSTECTOMY WITH INTRAOPERATIVE CHOLANGIOGRAM
Anesthesia: General

## 2020-02-18 MED ORDER — ONDANSETRON HCL 4 MG/2ML IJ SOLN
4.0000 mg | Freq: Three times a day (TID) | INTRAMUSCULAR | Status: DC | PRN
  Administered 2020-02-18: 4 mg via INTRAVENOUS

## 2020-02-18 MED ORDER — ONDANSETRON 4 MG PO TBDP
4.0000 mg | ORAL_TABLET | Freq: Three times a day (TID) | ORAL | Status: DC | PRN
  Administered 2020-02-18 – 2020-02-19 (×2): 4 mg via ORAL
  Filled 2020-02-18 (×2): qty 1

## 2020-02-18 NOTE — Plan of Care (Signed)

## 2020-02-18 NOTE — Plan of Care (Signed)
  Problem: Education: Goal: Knowledge of General Education information will improve Description: Including pain rating scale, medication(s)/side effects and non-pharmacologic comfort measures 02/18/2020 2013 by Hubert Azure, RN Outcome: Progressing 02/18/2020 0839 by Hubert Azure, RN Outcome: Progressing   Problem: Health Behavior/Discharge Planning: Goal: Ability to manage health-related needs will improve 02/18/2020 2013 by Hubert Azure, RN Outcome: Progressing 02/18/2020 0839 by Hubert Azure, RN Outcome: Progressing   Problem: Clinical Measurements: Goal: Ability to maintain clinical measurements within normal limits will improve 02/18/2020 2013 by Hubert Azure, RN Outcome: Progressing 02/18/2020 0839 by Hubert Azure, RN Outcome: Progressing Goal: Will remain free from infection 02/18/2020 2013 by Hubert Azure, RN Outcome: Progressing 02/18/2020 0839 by Hubert Azure, RN Outcome: Progressing Goal: Diagnostic test results will improve Outcome: Progressing Goal: Respiratory complications will improve Outcome: Progressing

## 2020-02-18 NOTE — Progress Notes (Signed)
[facility] Progress Note  4 Days Post-Op  Subjective: Still having epigastric and RUQ pain but reports this is improving overall. Still having some nausea. Had several BMs since 3 this AM and that is helping her feel better as well. Ambulating in halls, motivated to get home.   Objective: Vital signs in last 24 hours: Temp:  [97.4 F (36.3 C)-99.2 F (37.3 C)] 98.6 F (37 C) (08/23 0539) Pulse Rate:  [83-95] 83 (08/23 0539) Resp:  [16-18] 17 (08/23 0539) BP: (108-135)/(79-91) 135/91 (08/23 0539) SpO2:  [93 %-96 %] 93 % (08/23 0539) Last BM Date: 02/12/20  Intake/Output from previous day: 08/22 0701 - 08/23 0700 In: 3445.6 [P.O.:90; I.V.:3224.6; IV Piggyback:131] Out: 2650 [Urine:2650] Intake/Output this shift: No intake/output data recorded.  PE: General: pleasant, WD, obese female who is sitting up in chair HEENT: Sclera are anicteric.  PERRL.  Ears and nose without any masses or lesions.  Mouth is pink and moist Heart: regular, rate, and rhythm.  Normal s1,s2. No obvious murmurs, gallops, or rubs noted.  Palpable radial and pedal pulses bilaterally Lungs: CTAB, no wheezes, rhonchi, or rales noted.  Respiratory effort nonlabored Abd: soft, ttp in epigastrium and RUQ, ND, +BS, incisions c/d/i    Lab Results:  Recent Labs    02/17/20 0600 02/18/20 0439  WBC 19.1* 16.7*  HGB 10.2* 9.5*  HCT 32.7* 29.9*  PLT 142* 162   BMET Recent Labs    02/16/20 0824 02/17/20 0600  NA 137 137  K 4.5 3.6  CL 105 103  CO2 25 24  GLUCOSE 89 86  BUN 6 6  CREATININE 0.77 0.69  CALCIUM 7.9* 8.3*   PT/INR No results for input(s): LABPROT, INR in the last 72 hours. CMP     Component Value Date/Time   NA 137 02/17/2020 0600   NA 143 01/02/2019 0844   K 3.6 02/17/2020 0600   CL 103 02/17/2020 0600   CO2 24 02/17/2020 0600   GLUCOSE 86 02/17/2020 0600   BUN 6 02/17/2020 0600   BUN 15 01/02/2019 0844   CREATININE 0.69 02/17/2020 0600   CREATININE 0.84  02/08/2020 1205   CALCIUM 8.3 (L) 02/17/2020 0600   PROT 5.8 (L) 02/17/2020 0600   PROT 6.9 08/22/2017 0826   ALBUMIN 2.8 (L) 02/17/2020 0600   ALBUMIN 4.4 08/22/2017 0826   AST 41 02/17/2020 0600   ALT 299 (H) 02/17/2020 0600   ALKPHOS 115 02/17/2020 0600   BILITOT 1.1 02/17/2020 0600   BILITOT <0.2 08/22/2017 0826   GFRNONAA >60 02/17/2020 0600   GFRNONAA 81 02/08/2020 1205   GFRAA >60 02/17/2020 0600   GFRAA 94 02/08/2020 1205   Lipase     Component Value Date/Time   LIPASE 103 (H) 02/17/2020 0600       Studies/Results: No results found.  Anti-infectives: Anti-infectives (From admission, onward)   Start     Dose/Rate Route Frequency Ordered Stop   02/15/20 1400  piperacillin-tazobactam (ZOSYN) IVPB 3.375 g  Status:  Discontinued        3.375 g 100 mL/hr over 30 Minutes Intravenous Every 8 hours 02/15/20 1117 02/15/20 1122   02/15/20 1400  piperacillin-tazobactam (ZOSYN) IVPB 3.375 g        3.375 g 12.5 mL/hr over 240 Minutes Intravenous Every 8 hours 02/15/20 1122 02/17/20 2359   02/13/20 0815  cefTRIAXone (ROCEPHIN) 2 g in sodium chloride 0.9 % 100 mL IVPB  Status:  Discontinued       "And" Linked Group Details  2 g 200 mL/hr over 30 Minutes Intravenous On call to O.R. 02/13/20 0804 02/13/20 1312   02/13/20 0815  metroNIDAZOLE (FLAGYL) IVPB 500 mg       "And" Linked Group Details   500 mg 100 mL/hr over 60 Minutes Intravenous On call to O.R. 02/13/20 0804 02/13/20 1040   02/13/20 0600  ceFAZolin (ANCEF) IVPB 2g/100 mL premix  Status:  Discontinued        2 g 200 mL/hr over 30 Minutes Intravenous On call to O.R. 02/13/20 0109 02/13/20 0804   02/13/20 0445  cefTRIAXone (ROCEPHIN) 2 g in sodium chloride 0.9 % 100 mL IVPB  Status:  Discontinued        2 g 200 mL/hr over 30 Minutes Intravenous Every 24 hours 02/13/20 0434 02/15/20 1117       Assessment/Plan Acute on Chronic Cholecystitis Choledocolithiasis Pancreatitis, suspect 2/2 ERCP - s/p Laparoscopic  Cholecystectomy with IOC and Core liver bx by Dr. Johney Maine on 02/13/20 - POD #5 - s/p ERCP by Dr. Ronnette Juniper on 8/19 - ERCP impression reads "biliary sphincterotomy was performed. The biliary tree was swept and nothing was found. Common bile duct was successfully dilated". . - CMET and lipase pending this AM - MRCP shows no residual stones with phlegmon  - liver biopsy does not show malignancy just chronic inflammation  - Mobilize, Pulm toilet - advancing to CLD  FEN -CLD VTE -SCDs, Lovenox ID -Flagyl 8/18. Rocephin 8/18> Zosyn 8/20 >>  LOS: 5 days    Norm Parcel , [facility] 02/18/2020, 8:08 AM Please see Amion for pager number during day hours 7:00am-4:30pm

## 2020-02-18 NOTE — Progress Notes (Signed)
Patient appears to have "turned the corner."  Her abdominal pain is much better, and she has been passing gas and had several bowel movements.  She feels much better overall.  She is not needing pain medication.  She actually is starting to feel hungry.  She has already been up and walking in the halls twice today.  Patient's vital signs are unremarkable.    Patient's white count has finally started to come down.  It is currently 16,700.  There continues to be a gradual drift downward in the hemoglobin level, currently 9.5.  Chemistries from today are still pending.  On exam, the patient is sitting up in the bedside chair, and appears much more animated and comfortable than she has the past several days.  She is in no distress.  Bowel sounds are present.  There is mild epigastric tenderness but nothing exquisite.  Impression: Progressive improvement in post ERCP pancreatitis  Plan:  1.  Decrease rate of IV fluids  2.  Start clear liquid diet.  Patient advised to just have small amounts at a time.  Cleotis Nipper, M.D. Pager [phone] If no answer or after 5 PM call [phone]

## 2020-02-19 DIAGNOSIS — K859 Acute pancreatitis without necrosis or infection, unspecified: Secondary | ICD-10-CM

## 2020-02-19 DIAGNOSIS — K8050 Calculus of bile duct without cholangitis or cholecystitis without obstruction: Secondary | ICD-10-CM

## 2020-02-19 DIAGNOSIS — K9189 Other postprocedural complications and disorders of digestive system: Secondary | ICD-10-CM

## 2020-02-19 LAB — COMPREHENSIVE METABOLIC PANEL
ALT: 158 U/L — ABNORMAL HIGH (ref 0–44)
AST: 17 U/L (ref 15–41)
Albumin: 2.4 g/dL — ABNORMAL LOW (ref 3.5–5.0)
Alkaline Phosphatase: 105 U/L (ref 38–126)
Anion gap: 9 (ref 5–15)
BUN: <5 mg/dL — ABNORMAL LOW (ref 6–20)
CO2: 26 mmol/L (ref 22–32)
Calcium: 8 mg/dL — ABNORMAL LOW (ref 8.9–10.3)
Chloride: 100 mmol/L (ref 98–111)
Creatinine, Ser: 0.61 mg/dL (ref 0.44–1.00)
GFR calc Af Amer: >60 mL/min (ref >60)
GFR calc non Af Amer: >60 mL/min (ref >60)
Glucose, Bld: 111 mg/dL — ABNORMAL HIGH (ref 70–99)
Potassium: 3.1 mmol/L — ABNORMAL LOW (ref 3.5–5.1)
Sodium: 135 mmol/L (ref 135–145)
Total Bilirubin: 0.6 mg/dL (ref 0.3–1.2)
Total Protein: 5.7 g/dL — ABNORMAL LOW (ref 6.5–8.1)

## 2020-02-19 LAB — CBC
HCT: 30.1 % — ABNORMAL LOW (ref 36.0–46.0)
Hemoglobin: 9.6 g/dL — ABNORMAL LOW (ref 12.0–15.0)
MCH: 28.7 pg (ref 26.0–34.0)
MCHC: 31.9 g/dL (ref 30.0–36.0)
MCV: 90.1 fL (ref 80.0–100.0)
Platelets: 192 10*3/uL (ref 150–400)
RBC: 3.34 MIL/uL — ABNORMAL LOW (ref 3.87–5.11)
RDW: 13.7 % (ref 11.5–15.5)
WBC: 11.3 10*3/uL — ABNORMAL HIGH (ref 4.0–10.5)
nRBC: 0 % (ref 0.0–0.2)

## 2020-02-19 LAB — LIPASE, BLOOD: Lipase: 39 U/L (ref 11–51)

## 2020-02-19 MED ORDER — POTASSIUM CHLORIDE CRYS ER 20 MEQ PO TBCR
60.0000 meq | EXTENDED_RELEASE_TABLET | Freq: Once | ORAL | Status: AC
  Administered 2020-02-19: 60 meq via ORAL
  Filled 2020-02-19: qty 3

## 2020-02-19 NOTE — Progress Notes (Signed)
Patient continues to have significant clinical improvement, with bowel movements, significantly decreased pain (took a couple of hydrocodone last night, has not recently needed IV pain medication), up and walking in the hall.  As noted by Dr. Rosendo Gros, the patient did have some discomfort with her clear liquid intake yesterday, but no nausea or vomiting.    Marked improvement in labs, with lipase now normal, ALT continuing to come down, and white count decreasing sharply to 11,300.  Hemoglobin has leveled off, renal function remains completely normal.  Exam: Vital signs are good, without tachycardia.  The abdomen has mild to moderate epigastric tenderness, no severe tympany, normal bowel sounds.  Impression: Further improvement in post ERCP pancreatitis, as evidenced by decreased pain and decreased white count.  Recommendation: Agree with continuation of clear liquids.  Depending how she does today, advancement to a low-fat diet tomorrow might be possible.  Based on my experience with other patients with this sort of problem, I anticipate the patient will be able to eat small to moderate amounts of low-fat food within the next 48 hours, therefore, in my opinion, it is reasonable to hold off on initiation of TNA, which is also the patient's strong preference.  Cleotis Nipper, M.D. Pager [phone] If no answer or after 5 PM call [phone]

## 2020-02-19 NOTE — Progress Notes (Signed)
5 Days Post-Op   Subjective/Chief Complaint: Pt with some pain with clears Mobilizing    Objective: Vital signs in last 24 hours: Temp:  [98 F (36.7 C)-98.5 F (36.9 C)] 98.5 F (36.9 C) (08/24 0546) Pulse Rate:  [72-85] 72 (08/24 0546) Resp:  [18] 18 (08/24 0546) BP: (131-146)/(86-98) 146/98 (08/24 0546) SpO2:  [95 %-98 %] 95 % (08/24 0546) Last BM Date: 02/18/20  Intake/Output from previous day: 08/23 0701 - 08/24 0700 In: 3421.2 [P.O.:1560; I.V.:1861.2] Out: 4081 [Urine:3475] Intake/Output this shift: No intake/output data recorded.  PE:  Constitutional: No acute distress, conversant, appears states age. Eyes: Anicteric sclerae, moist conjunctiva, no lid lag Lungs: Clear to auscultation bilaterally, normal respiratory effort CV: regular rate and rhythm, no murmurs, no peripheral edema, pedal pulses 2+ GI: Soft, no masses or hepatosplenomegaly, tender to palpation in epigastrum Skin: No rashes, palpation reveals normal turgor Psychiatric: appropriate judgment and insight, oriented to person, place, and time   Lab Results:  Recent Labs    02/18/20 0439 02/19/20 0514  WBC 16.7* 11.3*  HGB 9.5* 9.6*  HCT 29.9* 30.1*  PLT 162 192   BMET Recent Labs    02/18/20 0439 02/19/20 0514  NA 137 135  K 3.5 3.1*  CL 101 100  CO2 26 26  GLUCOSE 86 111*  BUN <5* <5*  CREATININE 0.73 0.61  CALCIUM 8.0* 8.0*   Anti-infectives: Anti-infectives (From admission, onward)   Start     Dose/Rate Route Frequency Ordered Stop   02/15/20 1400  piperacillin-tazobactam (ZOSYN) IVPB 3.375 g  Status:  Discontinued        3.375 g 100 mL/hr over 30 Minutes Intravenous Every 8 hours 02/15/20 1117 02/15/20 1122   02/15/20 1400  piperacillin-tazobactam (ZOSYN) IVPB 3.375 g        3.375 g 12.5 mL/hr over 240 Minutes Intravenous Every 8 hours 02/15/20 1122 02/17/20 2359   02/13/20 0815  cefTRIAXone (ROCEPHIN) 2 g in sodium chloride 0.9 % 100 mL IVPB  Status:  Discontinued        "And" Linked Group Details   2 g 200 mL/hr over 30 Minutes Intravenous On call to O.R. 02/13/20 0804 02/13/20 1312   02/13/20 0815  metroNIDAZOLE (FLAGYL) IVPB 500 mg       "And" Linked Group Details   500 mg 100 mL/hr over 60 Minutes Intravenous On call to O.R. 02/13/20 0804 02/13/20 1040   02/13/20 0600  ceFAZolin (ANCEF) IVPB 2g/100 mL premix  Status:  Discontinued        2 g 200 mL/hr over 30 Minutes Intravenous On call to O.R. 02/13/20 4481 02/13/20 0804   02/13/20 0445  cefTRIAXone (ROCEPHIN) 2 g in sodium chloride 0.9 % 100 mL IVPB  Status:  Discontinued        2 g 200 mL/hr over 30 Minutes Intravenous Every 24 hours 02/13/20 0434 02/15/20 1117      Assessment/Plan: Acute on Chronic Cholecystitis Choledocolithiasis Pancreatitis, suspect 2/2 ERCP - s/p Laparoscopic Cholecystectomy with IOC and Core liver bx by Dr. Johney Maine on 02/13/20 - POD #6 - s/p ERCP by Dr. Ronnette Juniper on 8/19 - ERCP impression reads "biliary sphincterotomy was performed. The biliary tree was swept and nothing was found. Common bile duct was successfully dilated". . -  lipase trending back to normal - MRCP shows no residual stones with phlegmon  - liver biopsy does not show malignancy just chronic inflammation  - Mobilize, Pulm toilet - con't  CLD for now unless can tol clears  without pain  FEN -CLD, I d/w the pt the need for TNA, but at this time she is refusing and does not want to consider it. VTE -SCDs, Lovenox ID -Flagyl 8/18. Rocephin 8/18> Zosyn 8/20>>   LOS: 6 days    Ralene Ok 02/19/2020

## 2020-02-20 ENCOUNTER — Inpatient Hospital Stay (HOSPITAL_COMMUNITY): Payer: 59

## 2020-02-20 DIAGNOSIS — M7989 Other specified soft tissue disorders: Secondary | ICD-10-CM

## 2020-02-20 LAB — COMPREHENSIVE METABOLIC PANEL
ALT: 119 U/L — ABNORMAL HIGH (ref 0–44)
AST: 19 U/L (ref 15–41)
Albumin: 2.6 g/dL — ABNORMAL LOW (ref 3.5–5.0)
Alkaline Phosphatase: 92 U/L (ref 38–126)
Anion gap: 10 (ref 5–15)
BUN: <5 mg/dL — ABNORMAL LOW (ref 6–20)
CO2: 27 mmol/L (ref 22–32)
Calcium: 8.3 mg/dL — ABNORMAL LOW (ref 8.9–10.3)
Chloride: 103 mmol/L (ref 98–111)
Creatinine, Ser: 0.63 mg/dL (ref 0.44–1.00)
GFR calc Af Amer: >60 mL/min (ref >60)
GFR calc non Af Amer: >60 mL/min (ref >60)
Glucose, Bld: 97 mg/dL (ref 70–99)
Potassium: 3.5 mmol/L (ref 3.5–5.1)
Sodium: 140 mmol/L (ref 135–145)
Total Bilirubin: 0.7 mg/dL (ref 0.3–1.2)
Total Protein: 5.7 g/dL — ABNORMAL LOW (ref 6.5–8.1)

## 2020-02-20 LAB — CBC
HCT: 29.8 % — ABNORMAL LOW (ref 36.0–46.0)
Hemoglobin: 9.5 g/dL — ABNORMAL LOW (ref 12.0–15.0)
MCH: 29.1 pg (ref 26.0–34.0)
MCHC: 31.9 g/dL (ref 30.0–36.0)
MCV: 91.1 fL (ref 80.0–100.0)
Platelets: 205 10*3/uL (ref 150–400)
RBC: 3.27 MIL/uL — ABNORMAL LOW (ref 3.87–5.11)
RDW: 13.7 % (ref 11.5–15.5)
WBC: 10.3 10*3/uL (ref 4.0–10.5)
nRBC: 0 % (ref 0.0–0.2)

## 2020-02-20 MED ORDER — CHOLESTYRAMINE 4 G PO PACK
4.0000 g | PACK | Freq: Four times a day (QID) | ORAL | Status: DC | PRN
  Administered 2020-02-20: 14:00:00 4 g via ORAL
  Filled 2020-02-20 (×4): qty 1

## 2020-02-20 MED ORDER — METHOCARBAMOL 500 MG PO TABS
500.0000 mg | ORAL_TABLET | Freq: Three times a day (TID) | ORAL | Status: DC
  Administered 2020-02-20 – 2020-02-21 (×4): 500 mg via ORAL
  Filled 2020-02-20 (×4): qty 1

## 2020-02-20 NOTE — Progress Notes (Signed)
Right upper extremity venous duplex has been completed. Preliminary results can be found in CV Proc through chart review.  Results were given to the patient's nurse, Leann.  02/20/20 10:50 AM Melissa Atkinson RVT

## 2020-02-20 NOTE — Progress Notes (Signed)
White count is finally in the normal range.  Hemoglobin stable, ALT shows progressive improvement with a 75% drop over the past 4 days, although still moderately elevated at 119.  Diarrhea could be antibiotic induced (antibiotics were stopped several days ago), C. difficile, or choleretic.  Agree with [place] and advancement of diet.   Cleotis Nipper, M.D. Pager [phone] If no answer or after 5 PM call [phone]

## 2020-02-20 NOTE — Progress Notes (Addendum)
[facility] Progress Note  6 Days Post-Op  Subjective: Patient reports she was incontinent of stool this AM - large loose BM that was yellowish in color. She has been having diarrhea all morning. She denies nausea. She is still having some pain but feels like it is improving.   Reports some swelling and numbness in R hand and R first finger not bending well.   Objective: Vital signs in last 24 hours: Temp:  [98.4 F (36.9 C)-98.7 F (37.1 C)] 98.4 F (36.9 C) (08/25 0544) Pulse Rate:  [71-84] 71 (08/25 0544) Resp:  [16-18] 16 (08/25 0544) BP: (122-148)/(75-86) 122/75 (08/25 0544) SpO2:  [95 %-97 %] 96 % (08/25 0544) Last BM Date: 02/18/20  Intake/Output from previous day: 08/24 0701 - 08/25 0700 In: 2278.2 [P.O.:1680; I.V.:598.2] Out: 3400 [Urine:3400] Intake/Output this shift: Total I/O In: 120 [P.O.:120] Out: -   PE: General: pleasant, WD, obese female who is sitting up in chair HEENT: Sclera are anicteric.  PERRL.  Ears and nose without any masses or lesions.  Mouth is pink and moist Heart: regular, rate, and rhythm.  Normal s1,s2. No obvious murmurs, gallops, or rubs noted.  Palpable radial and pedal pulses bilaterally Lungs: CTAB, no wheezes, rhonchi, or rales noted.  Respiratory effort nonlabored Abd: soft, ttp in epigastrium and RUQ, ND, +BS, incisions c/d/i    Lab Results:  Recent Labs    02/19/20 0514 02/20/20 0508  WBC 11.3* 10.3  HGB 9.6* 9.5*  HCT 30.1* 29.8*  PLT 192 205   BMET Recent Labs    02/19/20 0514 02/20/20 0508  NA 135 140  K 3.1* 3.5  CL 100 103  CO2 26 27  GLUCOSE 111* 97  BUN <5* <5*  CREATININE 0.61 0.63  CALCIUM 8.0* 8.3*   PT/INR No results for input(s): LABPROT, INR in the last 72 hours. CMP     Component Value Date/Time   NA 140 02/20/2020 0508   NA 143 01/02/2019 0844   K 3.5 02/20/2020 0508   CL 103 02/20/2020 0508   CO2 27 02/20/2020 0508   GLUCOSE 97 02/20/2020 0508   BUN <5 (L) 02/20/2020 0508    BUN 15 01/02/2019 0844   CREATININE 0.63 02/20/2020 0508   CREATININE 0.84 02/08/2020 1205   CALCIUM 8.3 (L) 02/20/2020 0508   PROT 5.7 (L) 02/20/2020 0508   PROT 6.9 08/22/2017 0826   ALBUMIN 2.6 (L) 02/20/2020 0508   ALBUMIN 4.4 08/22/2017 0826   AST 19 02/20/2020 0508   ALT 119 (H) 02/20/2020 0508   ALKPHOS 92 02/20/2020 0508   BILITOT 0.7 02/20/2020 0508   BILITOT <0.2 08/22/2017 0826   GFRNONAA >60 02/20/2020 0508   GFRNONAA 81 02/08/2020 1205   GFRAA >60 02/20/2020 0508   GFRAA 94 02/08/2020 1205   Lipase     Component Value Date/Time   LIPASE 39 02/19/2020 0514       Studies/Results: No results found.  Anti-infectives: Anti-infectives (From admission, onward)   Start     Dose/Rate Route Frequency Ordered Stop   02/15/20 1400  piperacillin-tazobactam (ZOSYN) IVPB 3.375 g  Status:  Discontinued        3.375 g 100 mL/hr over 30 Minutes Intravenous Every 8 hours 02/15/20 1117 02/15/20 1122   02/15/20 1400  piperacillin-tazobactam (ZOSYN) IVPB 3.375 g        3.375 g 12.5 mL/hr over 240 Minutes Intravenous Every 8 hours 02/15/20 1122 02/17/20 2359   02/13/20 0815  cefTRIAXone (ROCEPHIN) 2 g in sodium  chloride 0.9 % 100 mL IVPB  Status:  Discontinued       "And" Linked Group Details   2 g 200 mL/hr over 30 Minutes Intravenous On call to O.R. 02/13/20 0804 02/13/20 1312   02/13/20 0815  metroNIDAZOLE (FLAGYL) IVPB 500 mg       "And" Linked Group Details   500 mg 100 mL/hr over 60 Minutes Intravenous On call to O.R. 02/13/20 0804 02/13/20 1040   02/13/20 0600  ceFAZolin (ANCEF) IVPB 2g/100 mL premix  Status:  Discontinued        2 g 200 mL/hr over 30 Minutes Intravenous On call to O.R. 02/13/20 1100 02/13/20 0804   02/13/20 0445  cefTRIAXone (ROCEPHIN) 2 g in sodium chloride 0.9 % 100 mL IVPB  Status:  Discontinued        2 g 200 mL/hr over 30 Minutes Intravenous Every 24 hours 02/13/20 0434 02/15/20 1117       Assessment/Plan Acute on Chronic  Cholecystitis Choledocolithiasis Pancreatitis, suspect 2/2 ERCP - s/p Laparoscopic Cholecystectomy with IOC and Core liver bx by Dr. Johney Maine on 02/13/20 - POD #7 - s/p ERCP by Dr. Ronnette Juniper on 8/19 - ERCP impression reads "biliary sphincterotomy was performed. The biliary tree was swept and nothing was found. Common bile duct was successfully dilated". . -lipase trending back to normal - MRCP shows no residual stones with phlegmon - liver biopsy does not show malignancy just chronic inflammation - Mobilize, Pulm toilet - advance to [facility] diet, prn questran for diarrhea R hand numbness - likely just edema, [place] to r/o DVT  FEN -HH diet  VTE -SCDs, Lovenox ID -Flagyl 8/18. Rocephin 8/18> Zosyn 8/20  LOS: 7 days    Norm Parcel , [facility] 02/20/2020, 9:42 AM Please see Amion for pager number during day hours 7:00am-4:30pm

## 2020-02-21 LAB — COMPREHENSIVE METABOLIC PANEL
ALT: 105 U/L — ABNORMAL HIGH (ref 0–44)
AST: 30 U/L (ref 15–41)
Albumin: 2.7 g/dL — ABNORMAL LOW (ref 3.5–5.0)
Alkaline Phosphatase: 98 U/L (ref 38–126)
Anion gap: 8 (ref 5–15)
BUN: <5 mg/dL — ABNORMAL LOW (ref 6–20)
CO2: 27 mmol/L (ref 22–32)
Calcium: 8.3 mg/dL — ABNORMAL LOW (ref 8.9–10.3)
Chloride: 104 mmol/L (ref 98–111)
Creatinine, Ser: 0.61 mg/dL (ref 0.44–1.00)
GFR calc Af Amer: >60 mL/min (ref >60)
GFR calc non Af Amer: >60 mL/min (ref >60)
Glucose, Bld: 98 mg/dL (ref 70–99)
Potassium: 3.2 mmol/L — ABNORMAL LOW (ref 3.5–5.1)
Sodium: 139 mmol/L (ref 135–145)
Total Bilirubin: 0.5 mg/dL (ref 0.3–1.2)
Total Protein: 6.1 g/dL — ABNORMAL LOW (ref 6.5–8.1)

## 2020-02-21 LAB — CBC
HCT: 31.9 % — ABNORMAL LOW (ref 36.0–46.0)
Hemoglobin: 10 g/dL — ABNORMAL LOW (ref 12.0–15.0)
MCH: 28.3 pg (ref 26.0–34.0)
MCHC: 31.3 g/dL (ref 30.0–36.0)
MCV: 90.4 fL (ref 80.0–100.0)
Platelets: 247 10*3/uL (ref 150–400)
RBC: 3.53 MIL/uL — ABNORMAL LOW (ref 3.87–5.11)
RDW: 13.9 % (ref 11.5–15.5)
WBC: 10.3 10*3/uL (ref 4.0–10.5)
nRBC: 0 % (ref 0.0–0.2)

## 2020-02-21 MED ORDER — GABAPENTIN 400 MG PO CAPS: 400.0000 mg | ORAL_CAPSULE | Freq: Three times a day (TID) | ORAL | 0 refills | Status: DC

## 2020-02-21 MED ORDER — METHOCARBAMOL 500 MG PO TABS
500.0000 mg | ORAL_TABLET | Freq: Three times a day (TID) | ORAL | 0 refills | Status: DC
Start: 2020-02-21 — End: 2020-06-06

## 2020-02-21 MED ORDER — SACCHAROMYCES BOULARDII 250 MG PO CAPS: 250.0000 mg | ORAL_CAPSULE | Freq: Two times a day (BID) | ORAL | 0 refills | Status: DC

## 2020-02-21 MED ORDER — SIMETHICONE 80 MG PO CHEW: 40.0000 mg | CHEWABLE_TABLET | Freq: Four times a day (QID) | ORAL | Status: DC | PRN

## 2020-02-21 MED ORDER — HYDROCODONE-ACETAMINOPHEN 5-325 MG PO TABS: 1.0000 | ORAL_TABLET | Freq: Four times a day (QID) | ORAL | 0 refills | Status: DC | PRN

## 2020-02-21 MED ORDER — ACETAMINOPHEN 325 MG PO TABS: 650.0000 mg | ORAL_TABLET | Freq: Four times a day (QID) | ORAL | Status: DC | PRN

## 2020-02-21 MED ORDER — PANCRELIPASE (LIP-PROT-AMYL) 36000-114000 UNITS PO CPEP: 72000.0000 [IU] | ORAL_CAPSULE | Freq: Three times a day (TID) | ORAL | 0 refills | Status: AC

## 2020-02-21 MED FILL — METHOCARBAMOL 500 MG TABS: 500 | 20 days supply | Qty: 60 | Fill #0

## 2020-02-21 MED FILL — HYDROCODON-APAP 5-325: 5-325 | 4 days supply | Qty: 30 | Fill #0

## 2020-02-21 MED FILL — GABAPENTIN 400 MG CAPSULE: 400 | 30 days supply | Qty: 90 | Fill #0

## 2020-02-21 MED FILL — CREON DR 36,000 UNITS CAP: 36000-11400 | 7 days supply | Qty: 42 | Fill #0

## 2020-02-21 NOTE — Progress Notes (Signed)
[facility] Progress Note  7 Days Post-Op  Subjective: Patient reports she had severe pain yesterday afternoon after some yogurt and then only ate applesauce for dinner. She feels much better this AM and has been able to tolerate part of an omelet and some raisin bran. Diarrhea is resolved and stools have gone from yellow in color to more green. Patient really wanting to go home today.   Objective: Vital signs in last 24 hours: Temp:  [98.2 F (36.8 C)-99.1 F (37.3 C)] 98.2 F (36.8 C) (08/26 0451) Pulse Rate:  [75-82] 75 (08/26 0451) Resp:  [16] 16 (08/26 0451) BP: (125-152)/(79-94) 152/94 (08/26 0451) SpO2:  [94 %-96 %] 94 % (08/25 2112) Last BM Date: 02/20/20  Intake/Output from previous day: 08/25 0701 - 08/26 0700 In: 22 [P.O.:980] Out: 2050 [Urine:2050] Intake/Output this shift: Total I/O In: 360 [P.O.:360] Out: 350 [Urine:350]  PE: General: pleasant, WD,obese female who issitting in bed in NAD HEENT: Sclera areanicteric. PERRL. Ears and nose without any masses or lesions. Mouth is pink and moist Heart: regular, rate, and rhythm. Normal s1,s2. No obvious murmurs, gallops, or rubs noted. Palpable radial and pedal pulses bilaterally Lungs: CTAB, no wheezes, rhonchi, or rales noted. Respiratory effort nonlabored Abd: soft,ttp in epigastrium and RUQ, ND, +BS,incisions c/d/i   Lab Results:  Recent Labs    02/20/20 0508 02/21/20 0504  WBC 10.3 10.3  HGB 9.5* 10.0*  HCT 29.8* 31.9*  PLT 205 247   BMET Recent Labs    02/20/20 0508 02/21/20 0504  NA 140 139  K 3.5 3.2*  CL 103 104  CO2 27 27  GLUCOSE 97 98  BUN <5* <5*  CREATININE 0.63 0.61  CALCIUM 8.3* 8.3*   PT/INR No results for input(s): LABPROT, INR in the last 72 hours. CMP     Component Value Date/Time   NA 139 02/21/2020 0504   NA 143 01/02/2019 0844   K 3.2 (L) 02/21/2020 0504   CL 104 02/21/2020 0504   CO2 27 02/21/2020 0504   GLUCOSE 98 02/21/2020 0504   BUN <5  (L) 02/21/2020 0504   BUN 15 01/02/2019 0844   CREATININE 0.61 02/21/2020 0504   CREATININE 0.84 02/08/2020 1205   CALCIUM 8.3 (L) 02/21/2020 0504   PROT 6.1 (L) 02/21/2020 0504   PROT 6.9 08/22/2017 0826   ALBUMIN 2.7 (L) 02/21/2020 0504   ALBUMIN 4.4 08/22/2017 0826   AST 30 02/21/2020 0504   ALT 105 (H) 02/21/2020 0504   ALKPHOS 98 02/21/2020 0504   BILITOT 0.5 02/21/2020 0504   BILITOT <0.2 08/22/2017 0826   GFRNONAA >60 02/21/2020 0504   GFRNONAA 81 02/08/2020 1205   GFRAA >60 02/21/2020 0504   GFRAA 94 02/08/2020 1205   Lipase     Component Value Date/Time   LIPASE 39 02/19/2020 0514       Studies/Results: VAS [place] UPPER EXTREMITY VENOUS DUPLEX  Result Date: 02/20/2020 UPPER VENOUS STUDY  Indications: Swelling Risk Factors: Surgery. Comparison Study: No prior studies. Performing Technologist: Oliver Hum RVT  Examination Guidelines: A complete evaluation includes B-mode imaging, spectral Doppler, color Doppler, and power Doppler as needed of all accessible portions of each vessel. Bilateral testing is considered an integral part of a complete examination. Limited examinations for reoccurring indications may be performed as noted.  Right Findings: +----------+------------+---------+-----------+----------+-------+ RIGHT     CompressiblePhasicitySpontaneousPropertiesSummary +----------+------------+---------+-----------+----------+-------+ IJV           Full       Yes  Yes                      +----------+------------+---------+-----------+----------+-------+ Subclavian    Full       Yes       Yes                      +----------+------------+---------+-----------+----------+-------+ Axillary      Full       Yes       Yes                      +----------+------------+---------+-----------+----------+-------+ Brachial      Full       Yes       Yes                      +----------+------------+---------+-----------+----------+-------+ Radial         Full                                          +----------+------------+---------+-----------+----------+-------+ Ulnar         Full                                          +----------+------------+---------+-----------+----------+-------+ Cephalic    Partial                                  Acute  +----------+------------+---------+-----------+----------+-------+ Basilic       Full                                          +----------+------------+---------+-----------+----------+-------+  Left Findings: +----------+------------+---------+-----------+----------+-------+ LEFT      CompressiblePhasicitySpontaneousPropertiesSummary +----------+------------+---------+-----------+----------+-------+ Subclavian    Full       Yes       Yes                      +----------+------------+---------+-----------+----------+-------+  Summary:  Right: No evidence of deep vein thrombosis in the upper extremity. Findings consistent with acute superficial vein thrombosis involving the right cephalic vein.  Left: No evidence of thrombosis in the subclavian.  *See table(s) above for measurements and observations.    Preliminary     Anti-infectives: Anti-infectives (From admission, onward)   Start     Dose/Rate Route Frequency Ordered Stop   02/15/20 1400  piperacillin-tazobactam (ZOSYN) IVPB 3.375 g  Status:  Discontinued        3.375 g 100 mL/hr over 30 Minutes Intravenous Every 8 hours 02/15/20 1117 02/15/20 1122   02/15/20 1400  piperacillin-tazobactam (ZOSYN) IVPB 3.375 g        3.375 g 12.5 mL/hr over 240 Minutes Intravenous Every 8 hours 02/15/20 1122 02/17/20 2359   02/13/20 0815  cefTRIAXone (ROCEPHIN) 2 g in sodium chloride 0.9 % 100 mL IVPB  Status:  Discontinued       "And" Linked Group Details   2 g 200 mL/hr over 30 Minutes Intravenous On call to O.R. 02/13/20 0804 02/13/20 1312   02/13/20 0815  metroNIDAZOLE (FLAGYL) IVPB 500 mg       "And"  Linked Group  Details   500 mg 100 mL/hr over 60 Minutes Intravenous On call to O.R. 02/13/20 0804 02/13/20 1040   02/13/20 0600  ceFAZolin (ANCEF) IVPB 2g/100 mL premix  Status:  Discontinued        2 g 200 mL/hr over 30 Minutes Intravenous On call to O.R. 02/13/20 3646 02/13/20 0804   02/13/20 0445  cefTRIAXone (ROCEPHIN) 2 g in sodium chloride 0.9 % 100 mL IVPB  Status:  Discontinued        2 g 200 mL/hr over 30 Minutes Intravenous Every 24 hours 02/13/20 0434 02/15/20 1117       Assessment/Plan Acute on Chronic Cholecystitis Choledocolithiasis Pancreatitis, suspect 2/2 ERCP - s/p Laparoscopic Cholecystectomy with IOC and Core liver bx by Dr. Johney Maine on 02/13/20 - POD #8 - s/p ERCP by Dr. Ronnette Juniper on 8/19 - ERCP impression reads "biliary sphincterotomy was performed. The biliary tree was swept and nothing was found. Common bile duct was successfully dilated". . -lipase normalized - MRCP shows no residual stones with phlegmon - liver biopsy does not show malignancy just chronic inflammation - Mobilize, Pulm toilet -advance to [facility] diet - still having some pain with PO intake but so far better this AM - pt really wants to go home, will discuss with MD and GI this AM R SVT cephalic vein - warm compresses, follow up with PCP   FEN -HH diet  VTE -SCDs, Lovenox ID -Flagyl 8/18. Rocephin 8/18> Zosyn 8/20  LOS: 8 days    [facility] 02/21/2020, 10:00 AM Please see Amion for pager number during day hours 7:00am-4:30pm

## 2020-02-21 NOTE — Discharge Summary (Signed)
[facility] Discharge Summary   Patient ID: Melissa Atkinson MRN: [id number] DOB/AGE: 09/27/1970 49 y.o.  Admit date: 02/13/2020 Discharge date: 02/21/2020  Admitting Diagnosis: Cholelithiasis  Discharge Diagnosis Choledocholithiasis  Post-ERCP pancreatitis  Right cephalic superficial venous thrombosis  Consultants Gastroenterology   Imaging: VAS [place] UPPER EXTREMITY VENOUS DUPLEX  Result Date: 02/20/2020 UPPER VENOUS STUDY  Indications: Swelling Risk Factors: Surgery. Comparison Study: No prior studies. Performing Technologist: Oliver Hum RVT  Examination Guidelines: A complete evaluation includes B-mode imaging, spectral Doppler, color Doppler, and power Doppler as needed of all accessible portions of each vessel. Bilateral testing is considered an integral part of a complete examination. Limited examinations for reoccurring indications may be performed as noted.  Right Findings: +----------+------------+---------+-----------+----------+-------+ RIGHT     CompressiblePhasicitySpontaneousPropertiesSummary +----------+------------+---------+-----------+----------+-------+ IJV           Full       Yes       Yes                      +----------+------------+---------+-----------+----------+-------+ Subclavian    Full       Yes       Yes                      +----------+------------+---------+-----------+----------+-------+ Axillary      Full       Yes       Yes                      +----------+------------+---------+-----------+----------+-------+ Brachial      Full       Yes       Yes                      +----------+------------+---------+-----------+----------+-------+ Radial        Full                                          +----------+------------+---------+-----------+----------+-------+ Ulnar         Full                                          +----------+------------+---------+-----------+----------+-------+ Cephalic     Partial                                  Acute  +----------+------------+---------+-----------+----------+-------+ Basilic       Full                                          +----------+------------+---------+-----------+----------+-------+  Left Findings: +----------+------------+---------+-----------+----------+-------+ LEFT      CompressiblePhasicitySpontaneousPropertiesSummary +----------+------------+---------+-----------+----------+-------+ Subclavian    Full       Yes       Yes                      +----------+------------+---------+-----------+----------+-------+  Summary:  Right: No evidence of deep vein thrombosis in the upper extremity. Findings consistent with acute superficial vein thrombosis involving the right cephalic vein.  Left: No evidence of thrombosis in the subclavian.  *See table(s) above for measurements and observations.  Preliminary     Procedures Dr. Johney Maine (02/13/20) - Laparoscopic Cholecystectomy with IOC Dr. Therisa Doyne (02/14/20) - ERCP   Hospital Course:  Patient is a 49 year old female who presented to [facility] with abdominal pain. Workup showed cholelithiasis. Patient was admitted and underwent procedure listed above. Tolerated procedure well, IOC showed choledocholithiasis and GI was consulted. Patient had ERCP 8/19. She developed post-ERCP pancreatitis and mild ileus related to this. She improved with bowel rest and IV hydration.  Diet was advanced as tolerated.  On POD#8, the patient was voiding well, tolerating diet, ambulating well, pain well controlled, vital signs stable, incisions c/d/i and felt stable for discharge home.  Patient will follow up with GI in 1 week and with general surgery in 2 weeks.  Patient also noted some RUE pain and was found to have right cephalic superficial venous thrombosis. A copy of this report was forwarded to her PCP and she was instructed to follow up with PCP in 1-2 weeks.    I or a member of my team have reviewed  this patient in the Controlled Substance Database.   Allergies as of 02/21/2020   No Known Allergies     Medication List    STOP taking these medications   silver sulfADIAZINE 1 % cream Commonly known as: SILVADENE     TAKE these medications   acetaminophen 325 MG tablet Commonly known as: TYLENOL Take 2 tablets (650 mg total) by mouth every 6 (six) hours as needed for mild pain, moderate pain, fever or headache.   Bystolic 10 MG tablet Generic drug: nebivolol TAKE 1 TABLET (10 MG TOTAL) BY MOUTH DAILY. What changed: See the new instructions.   gabapentin 400 MG capsule Commonly known as: NEURONTIN Take 1 capsule (400 mg total) by mouth 3 (three) times daily.   hydrochlorothiazide 12.5 MG capsule Commonly known as: MICROZIDE Take 1 capsule (12.5 mg total) by mouth daily.   HYDROcodone-acetaminophen 5-325 MG tablet Commonly known as: NORCO/VICODIN Take 1-2 tablets by mouth every 6 (six) hours as needed for moderate pain or severe pain.   hydrOXYzine 50 MG tablet Commonly known as: ATARAX/VISTARIL Take 1 tablet (50 mg total) by mouth at bedtime and may repeat dose one time if needed. For itching.   lipase/protease/amylase 36000 UNITS Cpep capsule Commonly known as: Creon Take 2 capsules (72,000 Units total) by mouth 3 (three) times daily with meals for 7 days.   LORazepam 0.5 MG tablet Commonly known as: ATIVAN Take 1 tablet (0.5 mg total) by mouth 2 (two) times daily as needed for anxiety.   losartan 25 MG tablet Commonly known as: COZAAR Take 1 tablet (25 mg total) by mouth daily.   methocarbamol 500 MG tablet Commonly known as: ROBAXIN Take 1 tablet (500 mg total) by mouth 3 (three) times daily.   pantoprazole 40 MG tablet Commonly known as: PROTONIX Take 1 tablet (40 mg total) by mouth 2 (two) times daily.   Pepcid 20 MG tablet Generic drug: famotidine   promethazine 25 MG tablet Commonly known as: PHENERGAN Take 1 tablet (25 mg total) by mouth every  6 (six) hours as needed for nausea or vomiting.   saccharomyces boulardii 250 MG capsule Commonly known as: FLORASTOR Take 1 capsule (250 mg total) by mouth 2 (two) times daily.   simethicone 80 MG chewable tablet Commonly known as: MYLICON Chew 0.5 tablets (40 mg total) by mouth every 6 (six) hours as needed for flatulence (bloating).   sucralfate 1 g tablet Commonly known as:  CARAFATE Take 1 tablet (1 g total) by mouth 4 (four) times daily.         Follow-up Information    [facility], [place]. Go on 03/11/2020.   Specialty: General Surgery Why: Follow up appointment scheduled for 8:45 AM. Please arrive 15 min prior to appointment time. Bring photo ID and insurance information.  Contact information: [address] [place] [address] [phone]        Ronnette Juniper, MD. Call.   Specialty: Gastroenterology Why: Call for an appointment to be seen within 1 week for follow up of pancreatitis.  Contact information: [place] [address] [phone]        Silverio Decamp, MD. Call.   Specialties: Family Medicine, Sports Medicine, Radiology Why: Call and schedule a follow up appointment for 1-2 weeks to follow up for superficial venous thrombosis.  Contact information: [place] [address] [phone]        Lelon Perla, MD .   Specialty: Cardiology Contact information: [address] [place] [address] [phone]               Signed: Norm Parcel , [facility] 02/21/2020, 11:17 AM Please see Amion for pager number during day hours 7:00am-4:30pm

## 2020-02-21 NOTE — Progress Notes (Signed)
Discussed case with [facility] of general surgery.  Plans for discharge noted.  I agree the patient is appropriate for discharge at this time.  On exam, there is mild epigastric tenderness, but she has tolerated a solid diet, albeit with some discomfort.  Diarrhea is resolved.  White count today remains normal.  Recommendations:  1.  Okay for discharge  2.  I have sent a message to Dr. Therisa Doyne to arrange a follow-up visit for the patient, approximately 5 to 10 days from now would be reasonable.  The patient knows to call our office if she does not hear from [place], and as needed for questions that may arise between now and the office appointment.  3.  I think the patient would benefit from Creon 72,000 units with each meal to "rest" the pancreas which might reduce some of the patient's postprandial discomfort.  It may not help, but it would not hurt.    4.  I have recommended to the patient that she have frequent, small, low-fat meals.  For them, 36,000 units (1 capsule) of the Creon would probably be sufficient.  5.  Questran is optional --the patient does not need it if the diarrhea remains quiescent.  Cleotis Nipper, M.D. Pager [phone] If no answer or after 5 PM call [phone]

## 2020-02-21 NOTE — Discharge Instructions (Signed)
[facility] LAP[facility]: POST OP INSTRUCTIONS Always review your discharge instruction sheet given to you by the facility where your surgery was performed. IF YOU HAVE DISABILITY OR FAMILY LEAVE FORMS, YOU MUST BRING THEM TO THE OFFICE FOR PROCESSING.   DO NOT GIVE THEM TO YOUR DOCTOR.  PAIN CONTROL  1. First take acetaminophen (Tylenol) AND/or ibuprofen (Advil) to control your pain after surgery.  Follow directions on package.  Taking acetaminophen (Tylenol) and/or ibuprofen (Advil) regularly after surgery will help to control your pain and lower the amount of prescription pain medication you may need.  You should not take more than 3,000 mg (3 grams) of acetaminophen (Tylenol) in 24 hours.  You should not take ibuprofen (Advil), aleve, motrin, naprosyn or other NSAIDS if you have a history of stomach ulcers or chronic kidney disease.  2. A prescription for pain medication may be given to you upon discharge.  Take your pain medication as prescribed, if you still have uncontrolled pain after taking acetaminophen (Tylenol) or ibuprofen (Advil). 3. Use ice packs to help control pain. 4. If you need a refill on your pain medication, please contact your pharmacy.  They will contact our office to request authorization. Prescriptions will not be filled after 5pm or on week-ends.  HOME MEDICATIONS 5. Take your usually prescribed medications unless otherwise directed.  DIET 6. You should follow a light diet the first few days after arrival home.  Be sure to include lots of fluids daily. Avoid fatty, fried foods.   CONSTIPATION 7. It is common to experience some constipation after surgery and if you are taking pain medication.  Increasing fluid intake and taking a stool softener (such as Colace) will usually help or prevent this problem from occurring.  A mild laxative (Milk of Magnesia or Miralax) should be taken according to package instructions if there are no bowel  movements after 48 hours.  WOUND/INCISION CARE 8. Most patients will experience some swelling and bruising in the area of the incisions.  Ice packs will help.  Swelling and bruising can take several days to resolve.  9. Unless discharge instructions indicate otherwise, follow guidelines below  a. STERI-STRIPS - you may remove your outer bandages 48 hours after surgery, and you may shower at that time.  You have steri-strips (small skin tapes) in place directly over the incision.  These strips should be left on the skin for 7-10 days.   b. DERMABOND/SKIN GLUE - you may shower in 24 hours.  The glue will flake off over the next 2-3 weeks. 10. Any sutures or staples will be removed at the office during your follow-up visit.  ACTIVITIES 11. You may resume regular (light) daily activities beginning the next day--such as daily self-care, walking, climbing stairs--gradually increasing activities as tolerated.  You may have sexual intercourse when it is comfortable.  Refrain from any heavy lifting or straining until approved by your doctor. a. You may drive when you are no longer taking prescription pain medication, you can comfortably wear a seatbelt, and you can safely maneuver your car and apply brakes.  FOLLOW-UP 12. You should see your doctor in the office for a follow-up appointment approximately 2-3 weeks after your surgery.  You should have been given your post-op/follow-up appointment when your surgery was scheduled.  If you did not receive a post-op/follow-up appointment, make sure that you call for this appointment within a day or two after you arrive home to insure a convenient appointment time.  WHEN TO CALL YOUR DOCTOR: 1. Fever over 101.0 2. Inability to urinate 3. Continued bleeding from incision. 4. Increased pain, redness, or drainage from the incision. 5. Increasing abdominal pain  The clinic staff is available to answer your questions during regular business hours.  Please dont  hesitate to call and ask to speak to one of the nurses for clinical concerns.  If you have a medical emergency, go to the nearest emergency room or call 911.  A surgeon from [facility] is always on call at the hospital. [address], [place], [place], [place]  [address] ? [address], [place], [place]   [address] [phone] ? [phone] ? FAX [phone] Web site: [email or web address]    Acute Pancreatitis  The pancreas is a gland that is located behind the stomach on the left side of the abdomen. It produces enzymes that help to digest food. The pancreas also releases the hormones glucagon and insulin, which help to regulate blood sugar. Acute pancreatitis happens when inflammation of the pancreas suddenly occurs and the pancreas becomes irritated and swollen. Most acute attacks last a few days and cause serious problems. Some people become dehydrated and develop low blood pressure. In severe cases, bleeding in the abdomen can lead to shock and can be life-threatening. The lungs, heart, and kidneys may fail. What are the causes? This condition may be caused by:  Alcohol abuse.  Drug abuse.  Gallstones or other conditions that can block the tube that drains the pancreas (pancreatic duct).  A tumor in the pancreas. Other causes include:  Certain medicines.  Exposure to certain chemicals.  Diabetes.  An infection in the pancreas.  Damage caused by an accident (trauma).  The poison (venom) from a scorpion bite.  Abdominal surgery.  Autoimmune pancreatitis. This is when the body's disease-fighting (immune) system attacks the pancreas.  Genes that are passed from parent to child (inherited). In some cases, the cause of this condition is not known. What are the signs or symptoms? Symptoms of this condition include:  Pain in the upper abdomen that may radiate to the back. Pain may be severe.  Tenderness and swelling of the  abdomen.  Nausea and vomiting.  Fever. How is this diagnosed? This condition may be diagnosed based on:  A physical exam.  Blood tests.  Imaging tests, such as X-rays, CT or MRI scans, or an ultrasound of the abdomen. How is this treated? Treatment for this condition usually requires a stay in the hospital. Treatment for this condition may include:  Pain medicine.  Fluid replacement through an IV.  Placing a tube in the stomach to remove stomach contents and to control vomiting (NG tube, or nasogastric tube).  Not eating for 3-4 days. This gives the pancreas a rest, because enzymes are not being produced that can cause further damage.  Antibiotic medicines, if your condition is caused by an infection.  Treating any underlying conditions that may be the cause.  Steroid medicines, if your condition is caused by your immune system attacking your body's own tissues (autoimmune disease).  Surgery on the pancreas or gallbladder. Follow these instructions at home: Eating and drinking   Follow instructions from your health care provider about diet. This may involve avoiding alcohol and decreasing the amount of fat in your diet.  Eat smaller, more frequent meals. This reduces the amount of digestive fluids that the pancreas produces.  Drink enough fluid to keep your urine pale yellow.  Do not drink alcohol  if it caused your condition. General instructions  Take over-the-counter and prescription medicines only as told by your health care provider.  Do not drive or use heavy machinery while taking prescription pain medicine.  Ask your health care provider if the medicine prescribed to you can cause constipation. You may need to take steps to prevent or treat constipation, such as: ? Take an over-the-counter or prescription medicine for constipation. ? Eat foods that are high in fiber such as whole grains and beans. ? Limit foods that are high in fat and processed sugars, such  as fried or sweet foods.  Do not use any products that contain nicotine or tobacco, such as cigarettes, e-cigarettes, and chewing tobacco. If you need help quitting, ask your health care provider.  Get plenty of rest.  If directed, check your blood sugar at home as told by your health care provider.  Keep all follow-up visits as told by your health care provider. This is important. Contact a health care provider if you:  Do not recover as quickly as expected.  Develop new or worsening symptoms.  Have persistent pain, weakness, or nausea.  Recover and then have another episode of pain.  Have a fever. Get help right away if:  You cannot eat or keep fluids down.  Your pain becomes severe.  Your skin or the white part of your eyes turns yellow (jaundice).  You have sudden swelling in your abdomen.  You vomit.  You feel dizzy or you faint.  Your blood sugar is high (over 300 mg/dL). Summary  Acute pancreatitis happens when inflammation of the pancreas suddenly occurs and the pancreas becomes irritated and swollen.  This condition is typically caused by alcohol abuse, drug abuse, or gallstones.  Treatment for this condition usually requires a stay in the hospital. This information is not intended to replace advice given to you by your health care provider. Make sure you discuss any questions you have with your health care provider. Document Revised: 04/03/2018 Document Reviewed: 12/19/2017 Elsevier Patient Education  [place] Heart-healthy meal planning includes:  Eating less unhealthy fats.  Eating more healthy fats.  Making other changes in your diet.   What are tips for following this plan? Cooking Avoid frying your food. Try to bake, boil, grill, or broil it instead. You can also reduce fat by:  Removing the skin from poultry.  Removing all visible fats from meats.  Steaming vegetables in water or broth. Meal  planning   At meals, divide your plate into four equal parts: ? Fill one-half of your plate with vegetables and green salads. ? Fill one-fourth of your plate with whole grains. ? Fill one-fourth of your plate with lean protein foods.  Eat 4-5 servings of vegetables per day. A serving of vegetables is: ? 1 cup of raw or cooked vegetables. ? 2 cups of raw leafy greens.  Eat 4-5 servings of fruit per day. A serving of fruit is: ? 1 medium whole fruit. ?  cup of dried fruit. ?  cup of fresh, frozen, or canned fruit. ?  cup of 100% fruit juice.  Eat more foods that have soluble fiber. These are apples, broccoli, carrots, beans, peas, and barley. Try to get 20-30 g of fiber per day.  Eat 4-5 servings of nuts, legumes, and seeds per week: ? 1 serving of dried beans or legumes equals  cup after being cooked. ? 1 serving of nuts is  cup. ? 1  serving of seeds equals 1 tablespoon. General information  Eat more home-cooked food. Eat less restaurant, buffet, and fast food.  Limit or avoid alcohol.  Limit foods that are high in starch and sugar.  Avoid fried foods.  Lose weight if you are overweight.  Keep track of how much salt (sodium) you eat. This is important if you have high blood pressure. Ask your doctor to tell you more about this.  Try to add vegetarian meals each week. Fats  Choose healthy fats. These include olive oil and canola oil, flaxseeds, walnuts, almonds, and seeds.  Eat more omega-3 fats. These include salmon, mackerel, sardines, tuna, flaxseed oil, and ground flaxseeds. Try to eat fish at least 2 times each week.  Check food labels. Avoid foods with trans fats or high amounts of saturated fat.  Limit saturated fats. ? These are often found in animal products, such as meats, butter, and cream. ? These are also found in plant foods, such as palm oil, palm kernel oil, and coconut oil.  Avoid foods with partially hydrogenated oils in them. These have trans  fats. Examples are stick margarine, some tub margarines, cookies, crackers, and other baked goods. What foods can I eat? Fruits All fresh, canned (in natural juice), or frozen fruits. Vegetables Fresh or frozen vegetables (raw, steamed, roasted, or grilled). Green salads. Grains Most grains. Choose whole wheat and whole grains most of the time. Rice and pasta, including brown rice and pastas made with whole wheat. Meats and other proteins Lean, well-trimmed beef, veal, pork, and lamb. Chicken and [place] without skin. All fish and shellfish. Wild duck, rabbit, pheasant, and venison. Egg whites or low-cholesterol egg substitutes. Dried beans, peas, lentils, and tofu. Seeds and most nuts. Dairy Low-fat or nonfat cheeses, including ricotta and mozzarella. Skim or 1% milk that is liquid, powdered, or evaporated. Buttermilk that is made with low-fat milk. Nonfat or low-fat yogurt. Fats and oils Non-hydrogenated (trans-free) margarines. Vegetable oils, including soybean, sesame, sunflower, olive, peanut, safflower, corn, canola, and cottonseed. Salad dressings or mayonnaise made with a vegetable oil. Beverages Mineral water. Coffee and tea. Diet carbonated beverages. Sweets and desserts Sherbet, gelatin, and fruit ice. Small amounts of dark chocolate. Limit all sweets and desserts. Seasonings and condiments All seasonings and condiments. The items listed above may not be a complete list of foods and drinks you can eat. Contact a dietitian for more options. What foods should I avoid? Fruits Canned fruit in heavy syrup. Fruit in cream or butter sauce. Fried fruit. Limit coconut. Vegetables Vegetables cooked in cheese, cream, or butter sauce. Fried vegetables. Grains Breads that are made with saturated or trans fats, oils, or whole milk. Croissants. Sweet rolls. Donuts. High-fat crackers, such as cheese crackers. Meats and other proteins Fatty meats, such as hot dogs, ribs, sausage, bacon,  rib-eye roast or steak. High-fat deli meats, such as salami and bologna. Caviar. Domestic duck and goose. Organ meats, such as liver. Dairy Cream, sour cream, cream cheese, and creamed cottage cheese. Whole-milk cheeses. Whole or 2% milk that is liquid, evaporated, or condensed. Whole buttermilk. Cream sauce or high-fat cheese sauce. Yogurt that is made from whole milk. Fats and oils Meat fat, or shortening. Cocoa butter, hydrogenated oils, palm oil, coconut oil, palm kernel oil. Solid fats and shortenings, including bacon fat, salt pork, lard, and butter. Nondairy cream substitutes. Salad dressings with cheese or sour cream. Beverages Regular sodas and juice drinks with added sugar. Sweets and desserts Frosting. Pudding. Cookies. Cakes. Pies. Milk chocolate  or white chocolate. Buttered syrups. Full-fat ice cream or ice cream drinks. The items listed above may not be a complete list of foods and drinks to avoid. Contact a dietitian for more information. Summary  Heart-healthy meal planning includes eating less unhealthy fats, eating more healthy fats, and making other changes in your diet.  Eat a balanced diet. This includes fruits and vegetables, low-fat or nonfat dairy, lean protein, nuts and legumes, whole grains, and heart-healthy oils and fats. This information is not intended to replace advice given to you by your health care provider. Make sure you discuss any questions you have with your health care provider. Document Revised: 08/18/2017 Document Reviewed: 07/22/2017 Elsevier Patient Education  2020 [employer].

## 2020-02-22 ENCOUNTER — Encounter: Payer: Self-pay | Admitting: *Deleted

## 2020-02-22 ENCOUNTER — Other Ambulatory Visit: Payer: Self-pay | Admitting: *Deleted

## 2020-02-22 ENCOUNTER — Other Ambulatory Visit: Payer: Self-pay | Admitting: Gastroenterology

## 2020-02-22 DIAGNOSIS — K859 Acute pancreatitis without necrosis or infection, unspecified: Secondary | ICD-10-CM

## 2020-02-22 NOTE — Patient Outreach (Signed)
Melissa Atkinson) Care Management  02/22/2020  Melissa Atkinson Jul 19, 1970 [id number]   Transition of care call/case closure   Referral received:02/14/20  Initial outreach:02/22/20 Insurance: [employer]    Subjective: Initial successful telephone call to patient's preferred number in order to complete transition of care assessment; 2 HIPAA identifiers verified. Explained purpose of call and completed transition of care assessment.  Melissa Atkinson states that she is doing okay glad to be at home. She reports incisional areas are unremarkable. She discussed pain is well managed with prescribed medications.  She report having episode of diarrhea described as not being watery, sort bile colored , soft formed stool at times. She has made contact with GI on today and has orders for stool sample to be collected and she will send it in on today. She states that her appetite is slow to return, some nausea no vomiting she if focusing on small meals, grazing throughout the day. tolerating liquids, gatorade, tea, has banana on this morning.  gatolerating diet. 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 She voiced continued to use warm compress to right arm superficial venous thrombosis and has follow up scheduled with PCP Discussed accessing the following [place] benefits   She states that she does have the hospital indemnity, and plans to follow up states she has contact number, appreciative of reminder.  She uses a [facility] outpatient pharmacy at [facility] pharmacy.  She denies educational needs related to staying safe during the COVID 19 pandemic.    Objective:   Melissa Atkinson  was hospitalized at [facility]  from 8/17-8/26/21 for Acute Cholecystitis, Laparoscopic cholecystectomy with IOC, common bile duct stones, ERCP, Pancreatitis, right arm cephalic superficial venous thrombosis.  Comorbidities include:  Hypertension, palpitations, polycythemia vera, thoracic aortic aneurysm , Hx of Breast Cancer, bilateral mastectomy.  She was discharged to home on 02/21/20 without the need for home health services or DME.   Assessment:  Patient voices good understanding of all discharge instructions.  See transition of care flowsheet for assessment details.   Plan:  Reviewed hospital discharge diagnosis of Laparoscopic cholecystectomy, ERCP- Pancreatitis   and discharge treatment plan using hospital discharge instructions, assessing medication adherence, reviewing problems requiring provider notification, and discussing the importance of follow up with surgeon, primary care provider and/or specialists as directed.  Reviewed [place] healthy lifestyle program information to receive discounted premium for  2022   Step 1: Get  your annual physical  Step 2: Complete your health assessment  Step 3:Identify your current health status and complete the corresponding action step between January 1, and February 27, 2020.      No ongoing care management needs identified so will close case to [place] Management services and route successful outreach letter with [place] Management pamphlet and 24 Hour Nurse Line Magnet to [place] [facility] clinical pool to be mailed to patient's home address.  Thanked patient for their services to [facility].   Joylene Draft, RN, BSN  [place] Management Coordinator  [phone]- Mobile [phone]- Toll Free Main Office

## 2020-02-25 ENCOUNTER — Ambulatory Visit: Payer: 59

## 2020-02-25 ENCOUNTER — Other Ambulatory Visit: Payer: Self-pay

## 2020-02-25 ENCOUNTER — Ambulatory Visit (INDEPENDENT_AMBULATORY_CARE_PROVIDER_SITE_OTHER): Payer: 59 | Admitting: Nurse Practitioner

## 2020-02-25 ENCOUNTER — Encounter: Payer: Self-pay | Admitting: Nurse Practitioner

## 2020-02-25 VITALS — BP 120/88 | HR 56 | Temp 97.9°F | Ht 65.0 in | Wt 187.5 lb

## 2020-02-25 DIAGNOSIS — Z09 Encounter for follow-up examination after completed treatment for conditions other than malignant neoplasm: Secondary | ICD-10-CM | POA: Insufficient documentation

## 2020-02-25 DIAGNOSIS — R600 Localized edema: Secondary | ICD-10-CM | POA: Diagnosis not present

## 2020-02-25 DIAGNOSIS — I82611 Acute embolism and thrombosis of superficial veins of right upper extremity: Secondary | ICD-10-CM | POA: Insufficient documentation

## 2020-02-25 HISTORY — DX: Encounter for follow-up examination after completed treatment for conditions other than malignant neoplasm: Z09

## 2020-02-25 NOTE — Patient Instructions (Addendum)
Lets do the ultrasound today. If the thrombus is smaller then we can continue to watch and wait. If it has moved or is any bigger, we will plan on Eliqius.     Acute Pancreatitis  The pancreas is a gland that is located behind the stomach on the left side of the abdomen. It produces enzymes that help to digest food. The pancreas also releases the hormones glucagon and insulin, which help to regulate blood sugar. Acute pancreatitis happens when inflammation of the pancreas suddenly occurs and the pancreas becomes irritated and swollen. Most acute attacks last a few days and cause serious problems. Some people become dehydrated and develop low blood pressure. In severe cases, bleeding in the abdomen can lead to shock and can be life-threatening. The lungs, heart, and kidneys may fail. What are the causes? This condition may be caused by:  Alcohol abuse.  Drug abuse.  Gallstones or other conditions that can block the tube that drains the pancreas (pancreatic duct).  A tumor in the pancreas. Other causes include:  Certain medicines.  Exposure to certain chemicals.  Diabetes.  An infection in the pancreas.  Damage caused by an accident (trauma).  The poison (venom) from a scorpion bite.  Abdominal surgery.  Autoimmune pancreatitis. This is when the body's disease-fighting (immune) system attacks the pancreas.  Genes that are passed from parent to child (inherited). In some cases, the cause of this condition is not known. What are the signs or symptoms? Symptoms of this condition include:  Pain in the upper abdomen that may radiate to the back. Pain may be severe.  Tenderness and swelling of the abdomen.  Nausea and vomiting.  Fever. How is this diagnosed? This condition may be diagnosed based on:  A physical exam.  Blood tests.  Imaging tests, such as X-rays, CT or MRI scans, or an ultrasound of the abdomen. How is this treated? Treatment for this condition usually  requires a stay in the hospital. Treatment for this condition may include:  Pain medicine.  Fluid replacement through an IV.  Placing a tube in the stomach to remove stomach contents and to control vomiting (NG tube, or nasogastric tube).  Not eating for 3-4 days. This gives the pancreas a rest, because enzymes are not being produced that can cause further damage.  Antibiotic medicines, if your condition is caused by an infection.  Treating any underlying conditions that may be the cause.  Steroid medicines, if your condition is caused by your immune system attacking your body's own tissues (autoimmune disease).  Surgery on the pancreas or gallbladder. Follow these instructions at home: Eating and drinking   Follow instructions from your health care provider about diet. This may involve avoiding alcohol and decreasing the amount of fat in your diet.  Eat smaller, more frequent meals. This reduces the amount of digestive fluids that the pancreas produces.  Drink enough fluid to keep your urine pale yellow.  Do not drink alcohol if it caused your condition. General instructions  Take over-the-counter and prescription medicines only as told by your health care provider.  Do not drive or use heavy machinery while taking prescription pain medicine.  Ask your health care provider if the medicine prescribed to you can cause constipation. You may need to take steps to prevent or treat constipation, such as: ? Take an over-the-counter or prescription medicine for constipation. ? Eat foods that are high in fiber such as whole grains and beans. ? Limit foods that are high in  fat and processed sugars, such as fried or sweet foods.  Do not use any products that contain nicotine or tobacco, such as cigarettes, e-cigarettes, and chewing tobacco. If you need help quitting, ask your health care provider.  Get plenty of rest.  If directed, check your blood sugar at home as told by your  health care provider.  Keep all follow-up visits as told by your health care provider. This is important. Contact a health care provider if you:  Do not recover as quickly as expected.  Develop new or worsening symptoms.  Have persistent pain, weakness, or nausea.  Recover and then have another episode of pain.  Have a fever. Get help right away if:  You cannot eat or keep fluids down.  Your pain becomes severe.  Your skin or the white part of your eyes turns yellow (jaundice).  You have sudden swelling in your abdomen.  You vomit.  You feel dizzy or you faint.  Your blood sugar is high (over 300 mg/dL). Summary  Acute pancreatitis happens when inflammation of the pancreas suddenly occurs and the pancreas becomes irritated and swollen.  This condition is typically caused by alcohol abuse, drug abuse, or gallstones.  Treatment for this condition usually requires a stay in the hospital. This information is not intended to replace advice given to you by your health care provider. Make sure you discuss any questions you have with your health care provider. Document Revised: 04/03/2018 Document Reviewed: 12/19/2017 Elsevier Patient Education  2020 [place].   Pancreatitis Eating Plan Pancreatitis is when your pancreas becomes irritated and swollen (inflamed). The pancreas is a small organ located behind your stomach. It helps your body digest food and regulate your blood sugar. Pancreatitis can affect how your body digests food, especially foods with fat. You may also have other symptoms such as abdominal pain or nausea. When you have pancreatitis, following a low-fat eating plan may help you manage symptoms and recover more quickly. Work with your health care provider or a diet and nutrition specialist (dietitian) to create an eating plan that is right for you. What are tips for following this plan? Reading food labels Use the information on food labels to help keep  track of how much fat you eat:  Check the serving size.  Look for the amount of total fat in grams (g) in one serving. ? Low-fat foods have 3 g of fat or less per serving. ? Fat-free foods have 0.5 g of fat or less per serving.  Keep track of how much fat you eat based on how many servings you eat. ? For example, if you eat two servings, the amount of fat you eat will be two times what is listed on the label. Shopping   Buy low-fat or nonfat foods, such as: ? Fresh, frozen, or canned fruits and vegetables. ? Grains, including pasta, bread, and rice. ? Lean meat, poultry, fish, and other protein foods. ? Low-fat or nonfat dairy.  Avoid buying bakery products and other sweets made with whole milk, butter, and eggs.  Avoid buying snack foods with added fat, such as anything with butter or cheese flavoring. Cooking  Remove skin from poultry, and remove extra fat from meat.  Limit the amount of fat and oil you use to 6 teaspoons or less per day.  Cook using low-fat methods, such as boiling, broiling, grilling, steaming, or baking.  Use spray oil to cook. Add fat-free chicken broth to add flavor and moisture.  Avoid  adding cream to thicken soups or sauces. Use other thickeners such as corn starch or tomato paste. Meal planning   Eat a low-fat diet as told by your dietitian. For most people, this means having no more than 55-65 grams of fat each day.  Eat small, frequent meals throughout the day. For example, you may have 5-6 small meals instead of 3 large meals.  Drink enough fluid to keep your urine pale yellow.  Do not drink alcohol. Talk to your health care provider if you need help stopping.  Limit how much caffeine you have, including black coffee, black and green tea, caffeinated soft drinks, and energy drinks. General information  Let your health care provider or dietitian know if you have unplanned weight loss on this eating plan.  You may be instructed to follow a  clear liquid diet during a flare of symptoms. Talk with your health care provider about how to manage your diet during symptoms of a flare.  Take any vitamins or supplements as told by your health care provider.  Work with a Microbiologist, especially if you have other conditions such as obesity or diabetes mellitus. What foods should I avoid? Fruits Fried fruits. Fruits served with butter or cream. Vegetables Fried vegetables. Vegetables cooked with butter, cheese, or cream. Grains Biscuits, waffles, donuts, pastries, and croissants. Pies and cookies. Butter-flavored popcorn. Regular crackers. Meats and other protein foods Fatty cuts of meat. Poultry with skin. Organ meats. Bacon, sausage, and cold cuts. Whole eggs. Nuts and nut butters. Dairy Whole and 2% milk. Whole milk yogurt. Whole milk ice cream. Cream and half-and-half. Cream cheese. Sour cream. Cheese. Beverages Wine, beer, and liquor. The items listed above may not be a complete list of foods and beverages to avoid. Contact a dietitian for more information. Summary  Pancreatitis can affect how your body digests food, especially foods with fat.  When you have pancreatitis, it is recommended that you follow a low-fat eating plan to help you recover more quickly and manage symptoms. For most people, this means limiting fat to no more than 55-65 grams per day.  Do not drink alcohol. Limit the amount of caffeine you have, and drink enough fluid to keep your urine pale yellow. This information is not intended to replace advice given to you by your health care provider. Make sure you discuss any questions you have with your health care provider. Document Revised: 10/05/2018 Document Reviewed: 09/20/2017 Elsevier Patient Education  [place].

## 2020-02-25 NOTE — Progress Notes (Signed)
Acute Office Visit  Subjective:    Patient ID: Melissa Atkinson, female    DOB: [birth year], 49 y.o.   MRN: [id number]  Chief Complaint  Patient presents with  . Hospitalization Follow-up    [facility] 02/13/2020-02/21/2020  . Superficial Thrombis    right arm    HPI Patient is in today for hospital follow-up for recent hospitalization for cholecystitis/cholecystectomy, ERCP, and subsequent acute pancreatitis related to ERCP. During the hospitalization she was also found to have developed a right cephalic superficial thrombus.   She is here to follow-up on hospitalization and the thrombus.   She reports she is slowly feeling better overall. She did have a 24 hour pain free period day before yesterday, however, yesterday through the night she experienced an exacerbation of her pain that was not relieved with oral pain medications, was severe in nature, and prevented her from sleeping. This event was discouraging to her, as she reports being weary from the experience as a whole.   She has not been taking the narcotic pain medication around the clock and has stopped the schedule robaxin and gabapentin. She reports that she has been taking the gabapentin once in the morning and she has been taking the pain medication at night. She is unsure if stopping the scheduled dosing of pain medication may have exacerbated the pain event last night or if this is simply part of the process of healing.   She has been following a regulated diet of small portions throughout the day including low fat and low carbohydrate foods. She does feel that she may have overdid it with her diet yesterday and that may have caused the additional pain last night. She does not drink alcohol and does not eat fried and fatty foods. She is planning to eliminate carbohydrates to see if this is helpful for her symptoms.   She reports that her incisions are healing well. She denies any redness, swelling, or drainage from  incision sites.   She reports her pain is limited to the epigastric region and is sharp and "relenting" in nature.   She reports her right arm is mildly aching from the superficial veinous thrombus. She does report the pain seems to have migrated from the anterior portion of the right proximal arm to the posterior and axillary portion of the right arm.  She has been taking 30m of aspirin and 1 Excedrin a day for anticoagulation. She denies redness, warmth, shortness of breath, chest pain, dizziness, numbness or tingling, or headache.  She does endorse the presenting symptom of inability to bend the DIP of the right pointer finger is still present.   She has an appointment with surgery for follow-up in the coming weeks.  Past Medical History:  Diagnosis Date  . Anxiety   . Back pain   . Bicuspid aortic valve   . Breast cancer ([facility]   . Cholelithiasis 01/2020  . Depression   . Joint pain   . Kidney cyst, acquired   . Obesity   . Palpitations   . Polycythemia vera ([facility]   . PONV (postoperative nausea and vomiting)    Oct 31 2019 hernia with out problems  . Thoracic aortic aneurysm ([facility]   . Thyroid nodule     Past Surgical History:  Procedure Laterality Date  . BILIARY DILATION  02/14/2020   Procedure: BILIARY DILATION;  Surgeon: KRonnette Juniper MD;  Location: [facility];  Service: Gastroenterology;;  . DMurrell Redden& CURRETTAGE/HYSTROSCOPY WITH THERMACHOICE ABLATION N/A 12/28/2012  Procedure: DILATATION & CURETTAGE/HYSTEROSCOPY WITH THERMACHOICE ABLATION;  Surgeon: Cyril Mourning, MD;  Location: [facility];  Service: Gynecology;  Laterality: N/A;  . ERCP N/A 02/14/2020   Procedure: ENDOSCOPIC RETROGRADE CHOLANGIOPANCREATOGRAPHY (ERCP);  Surgeon: Ronnette Juniper, MD;  Location: [facility];  Service: Gastroenterology;  Laterality: N/A;  . LAPAROSCOPIC CHOLECYSTECTOMY SINGLE PORT N/A 02/13/2020   Procedure: LAPAROSCOPIC CHOLECYSTECTOMY, CHOLANGIOGRAM, LIVER BIOPSY;  Surgeon: Michael Boston, MD;   Location: [facility];  Service: General;  Laterality: N/A;  . MASTECTOMY     bilateral  . PARTIAL NEPHRECTOMY     RT   . SPHINCTEROTOMY  02/14/2020   Procedure: SPHINCTEROTOMY;  Surgeon: Ronnette Juniper, MD;  Location: [facility];  Service: Gastroenterology;;  . TONSILLECTOMY    . TUBAL LIGATION      Family History  Problem Relation Age of Onset  . Cancer Mother        lung  . Depression Mother   . Anxiety disorder Mother   . Bipolar disorder Mother   . Alcoholism Mother   . COPD Father   . Congestive Heart Failure Father   . Depression Father   . Alcoholism Father   . Stroke Maternal Grandfather     Social History   Socioeconomic History  . Marital status: Married    Spouse name: Timmothy Sours  . Number of children: 3  . Years of education: Not on file  . Highest education level: Not on file  Occupational History  . Occupation: Programmer, multimedia: [employer]  Tobacco Use  . Smoking status: Never Smoker  . Smokeless tobacco: Never Used  Vaping Use  . Vaping Use: Never used  Substance and Sexual Activity  . Alcohol use: Yes    Comment: Occasional  . Drug use: No  . Sexual activity: Not on file  Other Topics Concern  . Not on file  Social History Narrative  . Not on file   Social Determinants of Health   Financial Resource Strain:   . Difficulty of Paying Living Expenses: Not on file  Food Insecurity:   . Worried About Charity fundraiser in the Last Year: Not on file  . Ran Out of Food in the Last Year: Not on file  Transportation Needs:   . Lack of Transportation (Medical): Not on file  . Lack of Transportation (Non-Medical): Not on file  Physical Activity:   . Days of Exercise per Week: Not on file  . Minutes of Exercise per Session: Not on file  Stress:   . Feeling of Stress : Not on file  Social Connections:   . Frequency of Communication with Friends and Family: Not on file  . Frequency of Social Gatherings with Friends and Family: Not on file  . Attends  Religious Services: Not on file  . Active Member of Clubs or Organizations: Not on file  . Attends Archivist Meetings: Not on file  . Marital Status: Not on file  Intimate Partner Violence:   . Fear of Current or Ex-Partner: Not on file  . Emotionally Abused: Not on file  . Physically Abused: Not on file  . Sexually Abused: Not on file    Outpatient Medications Prior to Visit  Medication Sig Dispense Refill  . acetaminophen (TYLENOL) 325 MG tablet Take 2 tablets (650 mg total) by mouth every 6 (six) hours as needed for mild pain, moderate pain, fever or headache.    Marland Kitchen BYSTOLIC 10 MG tablet TAKE 1 TABLET (10 MG TOTAL) BY  MOUTH DAILY. (Patient taking differently: Take 10 mg by mouth daily. ) 90 tablet 3  . gabapentin (NEURONTIN) 400 MG capsule Take 1 capsule (400 mg total) by mouth 3 (three) times daily. 90 capsule 0  . hydrochlorothiazide (MICROZIDE) 12.5 MG capsule Take 1 capsule (12.5 mg total) by mouth daily. 90 capsule 3  . HYDROcodone-acetaminophen (NORCO/VICODIN) 5-325 MG tablet Take 1-2 tablets by mouth every 6 (six) hours as needed for moderate pain or severe pain. 30 tablet 0  . lipase/protease/amylase (CREON) 36000 UNITS CPEP capsule Take 2 capsules (72,000 Units total) by mouth 3 (three) times daily with meals for 7 days. 42 capsule 0  . LORazepam (ATIVAN) 0.5 MG tablet Take 1 tablet (0.5 mg total) by mouth 2 (two) times daily as needed for anxiety. 30 tablet 0  . methocarbamol (ROBAXIN) 500 MG tablet Take 1 tablet (500 mg total) by mouth 3 (three) times daily. 60 tablet 0  . pantoprazole (PROTONIX) 40 MG tablet Take 1 tablet (40 mg total) by mouth 2 (two) times daily. 60 tablet 3  . promethazine (PHENERGAN) 25 MG tablet Take 1 tablet (25 mg total) by mouth every 6 (six) hours as needed for nausea or vomiting. 30 tablet 3  . simethicone (MYLICON) 80 MG chewable tablet Chew 0.5 tablets (40 mg total) by mouth every 6 (six) hours as needed for flatulence (bloating).    Marland Kitchen  losartan (COZAAR) 25 MG tablet Take 1 tablet (25 mg total) by mouth daily. 90 tablet 3  . famotidine (PEPCID) 20 MG tablet     . hydrOXYzine (ATARAX/VISTARIL) 50 MG tablet Take 1 tablet (50 mg total) by mouth at bedtime and may repeat dose one time if needed. For itching. 60 tablet 3  . saccharomyces boulardii (FLORASTOR) 250 MG capsule Take 1 capsule (250 mg total) by mouth 2 (two) times daily. (Patient not taking: Reported on 02/22/2020) 60 capsule 0  . sucralfate (CARAFATE) 1 g tablet Take 1 tablet (1 g total) by mouth 4 (four) times daily. 120 tablet 2   No facility-administered medications prior to visit.    No Known Allergies     Objective:    Physical Exam Vitals and nursing note reviewed.  Constitutional:      Appearance: Normal appearance.  HENT:     Head: Normocephalic and atraumatic.  Eyes:     Extraocular Movements: Extraocular movements intact.     Conjunctiva/sclera: Conjunctivae normal.     Pupils: Pupils are equal, round, and reactive to light.  Cardiovascular:     Rate and Rhythm: Normal rate and regular rhythm.     Pulses: Normal pulses.     Heart sounds: Normal heart sounds.  Pulmonary:     Effort: Pulmonary effort is normal.     Breath sounds: Normal breath sounds.  Abdominal:     General: Bowel sounds are normal. There is distension.     Palpations: Abdomen is soft.     Tenderness: There is abdominal tenderness in the epigastric area. There is no guarding.       Comments: Port sites unremarkable and well approximated.   Musculoskeletal:        General: Normal range of motion.     Cervical back: Normal range of motion and neck supple.  Skin:    General: Skin is warm and dry.     Capillary Refill: Capillary refill takes less than 2 seconds.     Findings: Bruising present.  Neurological:     General: No focal deficit present.  Mental Status: She is alert and oriented to person, place, and time.     Motor: No weakness.     Coordination: Coordination  normal.  Psychiatric:        Mood and Affect: Mood normal.        Behavior: Behavior normal. Behavior is cooperative.        Thought Content: Thought content normal.        Judgment: Judgment normal.     BP 120/88   Pulse (!) 56   Temp 97.9 F (36.6 C) (Oral)   Ht 5' 5"  (1.651 m)   Wt 187 lb 8 oz (85 kg)   SpO2 96%   BMI 31.20 kg/m  Wt Readings from Last 3 Encounters:  02/25/20 187 lb 8 oz (85 kg)  02/14/20 189 lb 13.1 oz (86.1 kg)  02/08/20 190 lb (86.2 kg)    There are no preventive care reminders to display for this patient.  There are no preventive care reminders to display for this patient.   Lab Results  Component Value Date   TSH 0.96 07/25/2019   Lab Results  Component Value Date   WBC 10.3 02/21/2020   HGB 10.0 (L) 02/21/2020   HCT 31.9 (L) 02/21/2020   MCV 90.4 02/21/2020   PLT 247 02/21/2020   Lab Results  Component Value Date   NA 139 02/21/2020   K 3.2 (L) 02/21/2020   CO2 27 02/21/2020   GLUCOSE 98 02/21/2020   BUN <5 (L) 02/21/2020   CREATININE 0.61 02/21/2020   BILITOT 0.5 02/21/2020   ALKPHOS 98 02/21/2020   AST 30 02/21/2020   ALT 105 (H) 02/21/2020   PROT 6.1 (L) 02/21/2020   ALBUMIN 2.7 (L) 02/21/2020   CALCIUM 8.3 (L) 02/21/2020   ANIONGAP 8 02/21/2020   Lab Results  Component Value Date   CHOL 205 (H) 07/25/2019   Lab Results  Component Value Date   HDL 73 07/25/2019   Lab Results  Component Value Date   LDLCALC 110 (H) 07/25/2019   Lab Results  Component Value Date   TRIG 114 07/25/2019   Lab Results  Component Value Date   CHOLHDL 2.8 07/25/2019   Lab Results  Component Value Date   HGBA1C 5.2 08/22/2017       Assessment & Plan:   Problem List Items Addressed This Visit      Cardiovascular and Mediastinum   Acute embolism and thrombosis of superficial vein of right upper extremity - Primary    Venous thrombus noted on doppler ultrasound during recent hospitalization for cholecystitis. Presenting symptom  of limited passive ROM of the DIP of the right index finger is still present.  No erythema, warmth, edema noted on exam.  Her pain has migrated to a new location, which does have me question possible movement of the thrombus. Will obtain follow-up doppler today for further evaluation. Recommend continue aspirin therapy, elevation of extremity, and warm compress. If able to apply compression to area, that is also recommended, although the location may make this difficult.  Discussed the option to consider Eliquis 18m x7d then [id number] thrombus has not decreased in size or has migrated. Otherwise will continue treatment with low dose ASA and supportive care.  Plan to determine appropriate follow-up once ultrasound results have been received.  Patient instructed to contact the office if new symptoms present or if symptoms worsen.       Relevant Orders   VAS US [place] EXTREMITY VENOUS DUPLEX  Other   Hospital discharge follow-up    Follow-up following urgent cholecystectomy and ERCP on August 18 and 19th. She was discharged on the 26th.  Pain is improving- but still dealing with intermittent severe epigastric/pancreatic pain.  Recommend continue pain medication and reverting to bland diet while symptoms exist.  If additional pain medication is needed, patient instructed to contact me or GI.  Incisions healing well with no signs of infection.  Right cephalic superficial thrombus will be followed with doppler ultrasound today- may consider anticoagulation if thrombus has moved or is larger. Continue low dose ASA for now.  Keep follow-up with surgery and GI.  Follow-up if you have any questions or new or worsening symptoms.           No orders of the defined types were placed in this encounter.    Orma Render, NP

## 2020-02-25 NOTE — Assessment & Plan Note (Signed)
Venous thrombus noted on doppler ultrasound during recent hospitalization for cholecystitis. Presenting symptom of limited passive ROM of the DIP of the right index finger is still present.  No erythema, warmth, edema noted on exam.  Her pain has migrated to a new location, which does have me question possible movement of the thrombus. Will obtain follow-up doppler today for further evaluation. Recommend continue aspirin therapy, elevation of extremity, and warm compress. If able to apply compression to area, that is also recommended, although the location may make this difficult.  Discussed the option to consider Eliquis 53m x7d then [id number] thrombus has not decreased in size or has migrated. Otherwise will continue treatment with low dose ASA and supportive care.  Plan to determine appropriate follow-up once ultrasound results have been received.  Patient instructed to contact the office if new symptoms present or if symptoms worsen.

## 2020-02-25 NOTE — Assessment & Plan Note (Signed)
Follow-up following urgent cholecystectomy and ERCP on August 18 and 19th. She was discharged on the 26th.  Pain is improving- but still dealing with intermittent severe epigastric/pancreatic pain.  Recommend continue pain medication and reverting to bland diet while symptoms exist.  If additional pain medication is needed, patient instructed to contact me or GI.  Incisions healing well with no signs of infection.  Right cephalic superficial thrombus will be followed with doppler ultrasound today- may consider anticoagulation if thrombus has moved or is larger. Continue low dose ASA for now.  Keep follow-up with surgery and GI.  Follow-up if you have any questions or new or worsening symptoms.

## 2020-02-26 NOTE — Progress Notes (Signed)
Non-occlusive superficial thrombus of the cephalic vein at the [facility]   No need to add anticoagulation. Continue 43m aspirin, warm compress, and compression to the area of the left antecubital fossa.

## 2020-02-29 MED FILL — BYSTOLIC 10 MG TABLET: 10 | 90 days supply | Qty: 90 | Fill #1

## 2020-03-05 DIAGNOSIS — Z8719 Personal history of other diseases of the digestive system: Secondary | ICD-10-CM | POA: Diagnosis not present

## 2020-03-17 ENCOUNTER — Other Ambulatory Visit: Payer: Self-pay | Admitting: Gastroenterology

## 2020-03-17 ENCOUNTER — Other Ambulatory Visit: Payer: Self-pay

## 2020-03-17 ENCOUNTER — Ambulatory Visit
Admission: RE | Admit: 2020-03-17 | Discharge: 2020-03-17 | Disposition: A | Discharge status: Home / Self Care | Payer: 59 | Source: Ambulatory Visit | Attending: Gastroenterology | Admitting: Gastroenterology

## 2020-03-17 DIAGNOSIS — K859 Acute pancreatitis without necrosis or infection, unspecified: Secondary | ICD-10-CM | POA: Diagnosis not present

## 2020-03-17 MED ORDER — IOPAMIDOL (ISOVUE-300) INJECTION 61%
100.0000 mL | Freq: Once | INTRAVENOUS | Status: AC | PRN
  Administered 2020-03-17: 100 mL via INTRAVENOUS

## 2020-03-25 ENCOUNTER — Other Ambulatory Visit: Payer: Self-pay | Admitting: Cardiology

## 2020-03-25 DIAGNOSIS — I10 Essential (primary) hypertension: Secondary | ICD-10-CM

## 2020-03-25 MED FILL — LOSARTAN POTASSIUM 25 MG TA: 25 | 90 days supply | Qty: 90 | Fill #0

## 2020-03-25 MED FILL — PANTOPRAZOLE SOD DR 40 MG T: 40 | 30 days supply | Qty: 60 | Fill #1

## 2020-03-27 MED FILL — HYDROCHLOROTHIAZIDE 12.5 MG: 12.5 | 90 days supply | Qty: 90 | Fill #1

## 2020-05-27 ENCOUNTER — Other Ambulatory Visit: Payer: Self-pay | Admitting: Nurse Practitioner

## 2020-05-27 ENCOUNTER — Other Ambulatory Visit: Payer: Self-pay

## 2020-05-27 ENCOUNTER — Encounter: Payer: Self-pay | Admitting: Nurse Practitioner

## 2020-05-27 ENCOUNTER — Telehealth (INDEPENDENT_AMBULATORY_CARE_PROVIDER_SITE_OTHER): Payer: 59 | Admitting: Nurse Practitioner

## 2020-05-27 VITALS — Temp 98.7°F

## 2020-05-27 DIAGNOSIS — N12 Tubulo-interstitial nephritis, not specified as acute or chronic: Secondary | ICD-10-CM

## 2020-05-27 MED ORDER — CIPROFLOXACIN HCL 500 MG PO TABS: 500.0000 mg | ORAL_TABLET | Freq: Two times a day (BID) | ORAL | 0 refills | Status: DC

## 2020-05-27 MED ORDER — FLUCONAZOLE 150 MG PO TABS: 150.0000 mg | ORAL_TABLET | Freq: Every day | ORAL | 1 refills | Status: DC

## 2020-05-27 MED FILL — FLUCONAZOLE 150 MG TABS: 150 | 1 days supply | Qty: 1 | Fill #0

## 2020-05-27 MED FILL — CIPROFLOXACIN HCL 500 MG TA: 500 | 7 days supply | Qty: 14 | Fill #0

## 2020-05-27 NOTE — Progress Notes (Signed)
Virtual Video Visit via MyChart Note-- [place]  I connected with  Melissa Atkinson on 05/27/20 at  3:30 PM EST by the video enabled telemedicine application for , MyChart, and verified that I am speaking with the correct person using two identifiers.   I introduced myself as a Designer, jewellery with the practice. We discussed the limitations of evaluation and management by telemedicine and the availability of in person appointments. The patient expressed understanding and agreed to proceed.  Participating parties in this visit include: The patient and nurse practitioner listed only The patient is: at work I am: in the office  Subjective:    CC:  Chief Complaint  Patient presents with  . Dysuria    onset 3 days ago, blood in urine at onset, was passing clots, hematuria resolved next day, bladder irritability/spasms, incomplete voiding, flank pain, frequency, urgency, feels feverish    HPI: Melissa Atkinson is a 49 y.o. y/o female presenting via [place] today for UTI symptoms.   Melissa Atkinson reports that about three days ago she noticed blood in her urine with some clots. She then started to have bladder spasms and irritability as well as back pain, increased urinary frequency and urgency, and feeling feverish. She has also had waves of nausea and diarrhea.  She reports the hematuria resolved on Sunday, but she is still experiencing the other symptoms. She describes the back pain as a heaviness on the right side more than left.   She denies sharp abdominal or back pains, vaginal bleeding, or vomiting. She is able to eat and hold liquids down. Her temperature is normal at this time and vitals are stable.   Past medical history, Surgical history, Family history not pertinant except as noted below, Social history, Allergies, and medications have been entered into the medical record, reviewed, and corrections made.   Review of Systems:  All review of systems  negative except what is listed in the HPI   Objective:    General: Speaking clearly in complete sentences without any shortness of breath.   Alert and oriented x3.   Normal judgment.  No apparent acute distress.   Impression and Recommendations:    1. Pyelonephritis Symptoms and presentation consistent with pyelonephritis. At this time her temperature is stable. She is at work at this time.  We will start treatment immediately, rather than wait for urine tests and cultures based on her symptoms. Given her symptoms, kidney stones are low on the differential, but did ask her to monitor for changes in her symptoms or new symptoms and report immediately.  Recommend OTC AZO for symptom management and increased hydration. If symptoms are not improving after 2 doses of medication, recommend follow-up with urine test.  - ciprofloxacin (CIPRO) 500 MG tablet; Take 1 tablet (500 mg total) by mouth 2 (two) times daily.  Dispense: 14 tablet; Refill: 0 - fluconazole (DIFLUCAN) 150 MG tablet; Take 1 tablet (150 mg total) by mouth daily.  Dispense: 1 tablet; Refill: 1     I discussed the assessment and treatment plan with the patient. The patient was provided an opportunity to ask questions and all were answered. The patient agreed with the plan and demonstrated an understanding of the instructions.   The patient was advised to call back or seek an in-person evaluation if the symptoms worsen or if the condition fails to improve as anticipated.  I provided 15 minutes of non-face-to-face interaction with this [place] visit including intake, same-day documentation,  and chart review. - CONVERTED TO TELEPHONE DUE TO TECHNICAL DIFFICULTIES.   Orma Render, NP

## 2020-05-27 NOTE — Patient Instructions (Signed)
Pyelonephritis, Adult °Pyelonephritis is an infection that occurs in the kidney. The kidneys are the organs that filter a person's blood and move waste out of the bloodstream and into the urine. Urine passes from the kidneys, through tubes called ureters, and into the bladder. There are two main types of pyelonephritis: °· Infections that come on quickly without any warning (acute pyelonephritis). °· Infections that last for a long period of time (chronic pyelonephritis). °In most cases, the infection clears up with treatment and does not cause further problems. More severe infections or chronic infections can sometimes spread to the bloodstream or lead to other problems with the kidneys. °What are the causes? °This condition is usually caused by: °· Bacteria traveling from the bladder up to the kidney. This may occur after having a bladder infection (cystitis) or urinary tract infection (UTI). °· Bladder infections caused from bacteria traveling from the bloodstream to the kidney. °What increases the risk? °This condition is more likely to develop in: °· Pregnant women. °· Older people. °· People who have any of these conditions: °? Diabetes. °? Inflammation of the prostate gland (prostatitis), in males. °? Kidney stones or bladder stones. °? Other abnormalities of the kidney or ureter. °? Cancer. °· People who have a catheter placed in the bladder. °· People who are sexually active. °· Women who use spermicides. °· People who have had a prior UTI. °What are the signs or symptoms? °Symptoms of this condition include: °· Frequent urination. °· Strong or persistent urge to urinate. °· Burning or stinging when urinating. °· Abdominal pain. °· Back pain. °· Pain in the side or flank area. °· Fever or chills. °· Blood in the urine, or dark urine. °· Nausea or vomiting. °How is this diagnosed? °This condition may be diagnosed based on: °· Your medical history and a physical exam. °· Urine tests. °· Blood tests. °You may  also have imaging tests of the kidneys, such as an ultrasound or CT scan. °How is this treated? °Treatment for this condition may depend on the severity of the infection. °· If the infection is mild and is found Bisma Klett, you may be treated with antibiotic medicines taken by mouth (orally). You will need to drink fluids to remain hydrated. °· If the infection is more severe, you may need to stay in the hospital and receive antibiotics given directly into a vein through an IV. You may also need to receive fluids through an IV if you are not able to remain hydrated. After your hospital stay, you may need to take oral antibiotics for a period of time. °Other treatments may be required, depending on the cause of the infection. °Follow these instructions at home: °Medicines °· Take your antibiotic medicine as told by your health care provider. Do not stop taking the antibiotic even if you start to feel better. °· Take over-the-counter and prescription medicines only as told by your health care provider. °General instructions ° °· Drink enough fluid to keep your urine pale yellow. °· Avoid caffeine, tea, and carbonated beverages. They tend to irritate the bladder. °· Urinate often. Avoid holding in urine for long periods of time. °· Urinate before and after sex. °· After a bowel movement, women should cleanse from front to back. Use each tissue only once. °· Keep all follow-up visits as told by your health care provider. This is important. °Contact a health care provider if: °· Your symptoms do not get better after 2 days of treatment. °· Your symptoms get worse. °·   You have a fever. °Get help right away if you: °· Are unable to take your antibiotics or fluids. °· Have shaking chills. °· Vomit. °· Have severe flank or back pain. °· Have extreme weakness or fainting. °Summary °· Pyelonephritis is a urinary tract infection (UTI) that occurs in the kidney. °· Treatment for this condition may depend on the severity of the  infection. °· Take your antibiotic medicine as told by your health care provider. Do not stop taking the antibiotic even if you start to feel better. °· Drink enough fluid to keep your urine pale yellow. °· Keep all follow-up visits as told by your health care provider. This is important. °This information is not intended to replace advice given to you by your health care provider. Make sure you discuss any questions you have with your health care provider. °Document Revised: 04/18/2018 Document Reviewed: 04/18/2018 °Elsevier Patient Education © 2020 Elsevier Inc. ° °

## 2020-05-28 MED FILL — NEBIVOLOL HCL 10 MG TABS: 10 | 90 days supply | Qty: 90 | Fill #2

## 2020-05-29 MED FILL — PANTOPRAZOLE SOD DR 40 MG T: 40 | 30 days supply | Qty: 60 | Fill #2

## 2020-06-06 ENCOUNTER — Encounter: Payer: Self-pay | Admitting: Nurse Practitioner

## 2020-06-06 ENCOUNTER — Ambulatory Visit (INDEPENDENT_AMBULATORY_CARE_PROVIDER_SITE_OTHER): Payer: 59 | Admitting: Nurse Practitioner

## 2020-06-06 ENCOUNTER — Other Ambulatory Visit: Payer: Self-pay

## 2020-06-06 VITALS — BP 120/77 | HR 65 | Temp 97.7°F | Ht 65.0 in | Wt 193.2 lb

## 2020-06-06 DIAGNOSIS — Z09 Encounter for follow-up examination after completed treatment for conditions other than malignant neoplasm: Secondary | ICD-10-CM

## 2020-06-06 DIAGNOSIS — N12 Tubulo-interstitial nephritis, not specified as acute or chronic: Secondary | ICD-10-CM

## 2020-06-06 DIAGNOSIS — K851 Biliary acute pancreatitis without necrosis or infection: Secondary | ICD-10-CM

## 2020-06-06 DIAGNOSIS — R609 Edema, unspecified: Secondary | ICD-10-CM | POA: Diagnosis not present

## 2020-06-06 NOTE — Progress Notes (Signed)
Established Patient Office Visit  Subjective:  Patient ID: Melissa Atkinson, female    DOB: Dec 11, 1970  Age: 49 y.o. MRN: [id number]  CC:  Chief Complaint  Patient presents with  . Pyelonephritis    Follow up    HPI [name] presents for follow-up after recent illness with cholecystectomy, acute pancreatitis, and pyelonephritis.   She reports that her symptoms have all resolved and she is feeling much better. She does endorse a change in bowel habits, consistent with post cholecystectomy. She also endorses some "tightness" in her hands and feet toward the end of the day and abdominal bloating.   She would like to have labs rechecked today to ensure that her lab values have returned to normal and there are no signs of ongoing liver, kidney, or WBC imbalance. Her last labs were in August of this year during the acute phase of her illness.   Past Medical History:  Diagnosis Date  . Anxiety   . Back pain   . Bicuspid aortic valve   . Breast cancer ([place])   . Cholelithiasis 01/2020  . Depression   . Joint pain   . Kidney cyst, acquired   . Obesity   . Palpitations   . Polycythemia vera ([place])   . PONV (postoperative nausea and vomiting)    Oct 31 2019 hernia with out problems  . Thoracic aortic aneurysm ([place])   . Thyroid nodule     Past Surgical History:  Procedure Laterality Date  . BILIARY DILATION  02/14/2020   Procedure: BILIARY DILATION;  Surgeon: Ronnette Juniper, MD;  Location: [facility];  Service: Gastroenterology;;  . Murrell Redden & CURRETTAGE/HYSTROSCOPY WITH THERMACHOICE ABLATION N/A 12/28/2012   Procedure: DILATATION & CURETTAGE/HYSTEROSCOPY WITH THERMACHOICE ABLATION;  Surgeon: Cyril Mourning, MD;  Location: [facility];  Service: Gynecology;  Laterality: N/A;  . ERCP N/A 02/14/2020   Procedure: ENDOSCOPIC RETROGRADE CHOLANGIOPANCREATOGRAPHY (ERCP);  Surgeon: Ronnette Juniper, MD;  Location: [facility];  Service: Gastroenterology;  Laterality: N/A;  . LAPAROSCOPIC  CHOLECYSTECTOMY SINGLE PORT N/A 02/13/2020   Procedure: LAPAROSCOPIC CHOLECYSTECTOMY, CHOLANGIOGRAM, LIVER BIOPSY;  Surgeon: Michael Boston, MD;  Location: [facility];  Service: General;  Laterality: N/A;  . MASTECTOMY     bilateral  . PARTIAL NEPHRECTOMY     RT   . SPHINCTEROTOMY  02/14/2020   Procedure: SPHINCTEROTOMY;  Surgeon: Ronnette Juniper, MD;  Location: [facility];  Service: Gastroenterology;;  . TONSILLECTOMY    . TUBAL LIGATION      Family History  Problem Relation Age of Onset  . Cancer Mother        lung  . Depression Mother   . Anxiety disorder Mother   . Bipolar disorder Mother   . Alcoholism Mother   . COPD Father   . Congestive Heart Failure Father   . Depression Father   . Alcoholism Father   . Stroke Maternal Grandfather     Social History   Socioeconomic History  . Marital status: Married    Spouse name: Timmothy Sours  . Number of children: 3  . Years of education: Not on file  . Highest education level: Not on file  Occupational History  . Occupation: Programmer, multimedia: [employer]  Tobacco Use  . Smoking status: Never Smoker  . Smokeless tobacco: Never Used  Vaping Use  . Vaping Use: Never used  Substance and Sexual Activity  . Alcohol use: Yes    Comment: Occasional  . Drug use: No  . Sexual activity: Not  on file  Other Topics Concern  . Not on file  Social History Narrative  . Not on file   Social Determinants of Health   Financial Resource Strain: Not on file  Food Insecurity: Not on file  Transportation Needs: Not on file  Physical Activity: Not on file  Stress: Not on file  Social Connections: Not on file  Intimate Partner Violence: Not on file    Outpatient Medications Prior to Visit  Medication Sig Dispense Refill  . aspirin EC 81 MG tablet Take 81 mg by mouth daily. Swallow whole.    Marland Kitchen BYSTOLIC 10 MG tablet TAKE 1 TABLET (10 MG TOTAL) BY MOUTH DAILY. (Patient taking differently: Take 10 mg by mouth daily.) 90 tablet 3  . hydrochlorothiazide  (MICROZIDE) 12.5 MG capsule Take 1 capsule (12.5 mg total) by mouth daily. 90 capsule 3  . LORazepam (ATIVAN) 0.5 MG tablet Take 1 tablet (0.5 mg total) by mouth 2 (two) times daily as needed for anxiety. 30 tablet 0  . losartan (COZAAR) 25 MG tablet TAKE 1 TABLET BY MOUTH DAILY. 90 tablet 3  . pantoprazole (PROTONIX) 40 MG tablet Take 1 tablet (40 mg total) by mouth 2 (two) times daily. 60 tablet 3  . simethicone (MYLICON) 80 MG chewable tablet Chew 80 mg by mouth every 6 (six) hours as needed for flatulence.    . ciprofloxacin (CIPRO) 500 MG tablet Take 1 tablet (500 mg total) by mouth 2 (two) times daily. (Patient not taking: Reported on 06/06/2020) 14 tablet 0  . fluconazole (DIFLUCAN) 150 MG tablet Take 1 tablet (150 mg total) by mouth daily. (Patient not taking: Reported on 06/06/2020) 1 tablet 1  . acetaminophen (TYLENOL) 325 MG tablet Take 2 tablets (650 mg total) by mouth every 6 (six) hours as needed for mild pain, moderate pain, fever or headache.    . gabapentin (NEURONTIN) 400 MG capsule Take 1 capsule (400 mg total) by mouth 3 (three) times daily. (Patient not taking: Reported on 05/27/2020) 90 capsule 0  . HYDROcodone-acetaminophen (NORCO/VICODIN) 5-325 MG tablet Take 1-2 tablets by mouth every 6 (six) hours as needed for moderate pain or severe pain. (Patient not taking: Reported on 05/27/2020) 30 tablet 0  . methocarbamol (ROBAXIN) 500 MG tablet Take 1 tablet (500 mg total) by mouth 3 (three) times daily. (Patient not taking: Reported on 05/27/2020) 60 tablet 0  . promethazine (PHENERGAN) 25 MG tablet Take 1 tablet (25 mg total) by mouth every 6 (six) hours as needed for nausea or vomiting. (Patient not taking: Reported on 05/27/2020) 30 tablet 3  . simethicone (MYLICON) 80 MG chewable tablet Chew 0.5 tablets (40 mg total) by mouth every 6 (six) hours as needed for flatulence (bloating). (Patient not taking: Reported on 05/27/2020)     No facility-administered medications prior to  visit.    No Known Allergies  ROS Review of Systems All review of systems negative except what is listed in the HPI    Objective:    Physical Exam Vitals and nursing note reviewed.  Constitutional:      Appearance: Normal appearance.  HENT:     Head: Normocephalic.  Eyes:     Extraocular Movements: Extraocular movements intact.     Conjunctiva/sclera: Conjunctivae normal.     Pupils: Pupils are equal, round, and reactive to light.  Cardiovascular:     Rate and Rhythm: Normal rate and regular rhythm.     Pulses: Normal pulses.     Heart sounds: Normal heart sounds.  Pulmonary:     Effort: Pulmonary effort is normal.     Breath sounds: Normal breath sounds.  Abdominal:     General: Abdomen is flat. Bowel sounds are normal. There is no distension.     Palpations: Abdomen is soft.     Tenderness: There is no abdominal tenderness. There is no guarding.  Musculoskeletal:        General: Normal range of motion.     Cervical back: Normal range of motion.     Right lower leg: No edema.     Left lower leg: No edema.  Skin:    General: Skin is warm and dry.     Capillary Refill: Capillary refill takes less than 2 seconds.  Neurological:     General: No focal deficit present.     Mental Status: She is alert and oriented to person, place, and time.  Psychiatric:        Mood and Affect: Mood normal.        Behavior: Behavior normal.        Thought Content: Thought content normal.        Judgment: Judgment normal.     BP 120/77   Pulse 65   Temp 97.7 F (36.5 C)   Ht 5' 5"  (1.651 m)   Wt 193 lb 3.2 oz (87.6 kg)   SpO2 98%   BMI 32.15 kg/m  Wt Readings from Last 3 Encounters:  06/06/20 193 lb 3.2 oz (87.6 kg)  02/25/20 187 lb 8 oz (85 kg)  02/14/20 189 lb 13.1 oz (86.1 kg)     There are no preventive care reminders to display for this patient.  There are no preventive care reminders to display for this patient.  Lab Results  Component Value Date   TSH 0.96  07/25/2019   Lab Results  Component Value Date   WBC 10.3 02/21/2020   HGB 10.0 (L) 02/21/2020   HCT 31.9 (L) 02/21/2020   MCV 90.4 02/21/2020   PLT 247 02/21/2020   Lab Results  Component Value Date   NA 139 02/21/2020   K 3.2 (L) 02/21/2020   CO2 27 02/21/2020   GLUCOSE 98 02/21/2020   BUN <5 (L) 02/21/2020   CREATININE 0.61 02/21/2020   BILITOT 0.5 02/21/2020   ALKPHOS 98 02/21/2020   AST 30 02/21/2020   ALT 105 (H) 02/21/2020   PROT 6.1 (L) 02/21/2020   ALBUMIN 2.7 (L) 02/21/2020   CALCIUM 8.3 (L) 02/21/2020   ANIONGAP 8 02/21/2020   Lab Results  Component Value Date   CHOL 205 (H) 07/25/2019   Lab Results  Component Value Date   HDL 73 07/25/2019   Lab Results  Component Value Date   LDLCALC 110 (H) 07/25/2019   Lab Results  Component Value Date   TRIG 114 07/25/2019   Lab Results  Component Value Date   CHOLHDL 2.8 07/25/2019   Lab Results  Component Value Date   HGBA1C 5.2 08/22/2017      Assessment & Plan:   1. Follow-up exam 2. Acute biliary pancreatitis, unspecified complication status 4. Pyelonephritis Patient appears to be doing well since her recent infections. Symptoms consistent with post cholecystectomy GI symptoms expected with stool changes.  Will obtain labs today to evaluate for return to baseline.  Information provided on dietary plan for pancreatitis prevention and discussed dietary guidelines for post cholecystectomy.  Will make changes to plan of care based on labs.  Follow-up as needed.  - CBC w/Diff/Platelet -  COMPLETE METABOLIC PANEL WITH GFR - Lipase  3. Edema, unspecified type Swelling in the hands and feet and abdominal bloating noted towards the end of the day.  She is currently taking HCTZ for blood pressure control and has stopped the lisinopril due to better blood pressure control at home. Discussed the option to hold HCTZ and start lisinopril to see if the swelling seems to decrease. Encouraged increased water  intake and dietary guidelines that can help reduce swelling.  Abdominal distention most likely related to GI affects post cholecystectomy, but will obtain labs today to evaluate albumin levels as these were low back in August.  Will make changes to plan of care based on labs.  Follow-up as needed.    Orma Render, NP

## 2020-06-06 NOTE — Patient Instructions (Signed)
We will recheck your labs today and make sure that things are coming back to normal.   For the swelling make sure you are drinking plenty of water. If you want to try to switch out the HCTZ for losartan and see if that makes a difference, you can try that.   Pancreatitis Eating Plan Pancreatitis is when your pancreas becomes irritated and swollen (inflamed). The pancreas is a small organ located behind your stomach. It helps your body digest food and regulate your blood sugar. Pancreatitis can affect how your body digests food, especially foods with fat. You may also have other symptoms such as abdominal pain or nausea. When you have pancreatitis, following a low-fat eating plan may help you manage symptoms and recover more quickly. Work with your health care provider or a diet and nutrition specialist (dietitian) to create an eating plan that is right for you. What are tips for following this plan? Reading food labels Use the information on food labels to help keep track of how much fat you eat:  Check the serving size.  Look for the amount of total fat in grams (g) in one serving. ? Low-fat foods have 3 g of fat or less per serving. ? Fat-free foods have 0.5 g of fat or less per serving.  Keep track of how much fat you eat based on how many servings you eat. ? For example, if you eat two servings, the amount of fat you eat will be two times what is listed on the label. Shopping   Buy low-fat or nonfat foods, such as: ? Fresh, frozen, or canned fruits and vegetables. ? Grains, including pasta, bread, and rice. ? Lean meat, poultry, fish, and other protein foods. ? Low-fat or nonfat dairy.  Avoid buying bakery products and other sweets made with whole milk, butter, and eggs.  Avoid buying snack foods with added fat, such as anything with butter or cheese flavoring. Cooking  Remove skin from poultry, and remove extra fat from meat.  Limit the amount of fat and oil you use to 6  teaspoons or less per day.  Cook using low-fat methods, such as boiling, broiling, grilling, steaming, or baking.  Use spray oil to cook. Add fat-free chicken broth to add flavor and moisture.  Avoid adding cream to thicken soups or sauces. Use other thickeners such as corn starch or tomato paste. Meal planning   Eat a low-fat diet as told by your dietitian. For most people, this means having no more than 55-65 grams of fat each day.  Eat small, frequent meals throughout the day. For example, you may have 5-6 small meals instead of 3 large meals.  Drink enough fluid to keep your urine pale yellow.  Do not drink alcohol. Talk to your health care provider if you need help stopping.  Limit how much caffeine you have, including black coffee, black and green tea, caffeinated soft drinks, and energy drinks. General information  Let your health care provider or dietitian know if you have unplanned weight loss on this eating plan.  You may be instructed to follow a clear liquid diet during a flare of symptoms. Talk with your health care provider about how to manage your diet during symptoms of a flare.  Take any vitamins or supplements as told by your health care provider.  Work with a Microbiologist, especially if you have other conditions such as obesity or diabetes mellitus. What foods should I avoid? Fruits Fried fruits. Fruits served with butter  or cream. Vegetables Fried vegetables. Vegetables cooked with butter, cheese, or cream. Grains Biscuits, waffles, donuts, pastries, and croissants. Pies and cookies. Butter-flavored popcorn. Regular crackers. Meats and other protein foods Fatty cuts of meat. Poultry with skin. Organ meats. Bacon, sausage, and cold cuts. Whole eggs. Nuts and nut butters. Dairy Whole and 2% milk. Whole milk yogurt. Whole milk ice cream. Cream and half-and-half. Cream cheese. Sour cream. Cheese. Beverages Wine, beer, and liquor. The items listed above may not be  a complete list of foods and beverages to avoid. Contact a dietitian for more information. Summary  Pancreatitis can affect how your body digests food, especially foods with fat.  When you have pancreatitis, it is recommended that you follow a low-fat eating plan to help you recover more quickly and manage symptoms. For most people, this means limiting fat to no more than 55-65 grams per day.  Do not drink alcohol. Limit the amount of caffeine you have, and drink enough fluid to keep your urine pale yellow. This information is not intended to replace advice given to you by your health care provider. Make sure you discuss any questions you have with your health care provider. Document Revised: 10/05/2018 Document Reviewed: 09/20/2017 Elsevier Patient Education  [place].

## 2020-06-07 LAB — COMPLETE METABOLIC PANEL WITH GFR
AG Ratio: 1.4 (calc) (ref 1.0–2.5)
ALT: 12 U/L (ref 6–29)
AST: 13 U/L (ref 10–35)
Albumin: 3.9 g/dL (ref 3.6–5.1)
Alkaline phosphatase (APISO): 80 U/L (ref 37–153)
BUN: 17 mg/dL (ref 7–25)
CO2: 28 mmol/L (ref 20–32)
Calcium: 9.3 mg/dL (ref 8.6–10.4)
Chloride: 103 mmol/L (ref 98–110)
Creat: 0.83 mg/dL (ref 0.50–1.05)
GFR, Est African American: 95 mL/min/{1.73_m2} (ref >=60)
GFR, Est Non African American: 82 mL/min/{1.73_m2} (ref >=60)
Globulin: 2.7 g/dL (calc) (ref 1.9–3.7)
Glucose, Bld: 94 mg/dL (ref 65–99)
Potassium: 4.2 mmol/L (ref 3.5–5.3)
Sodium: 142 mmol/L (ref 135–146)
Total Bilirubin: 0.3 mg/dL (ref 0.2–1.2)
Total Protein: 6.6 g/dL (ref 6.1–8.1)

## 2020-06-07 LAB — CBC WITH DIFFERENTIAL/PLATELET
Absolute Monocytes: 775 cells/uL (ref 200–950)
Basophils Absolute: 68 cells/uL (ref 0–200)
Basophils Relative: 0.9 %
Eosinophils Absolute: 403 cells/uL (ref 15–500)
Eosinophils Relative: 5.3 %
HCT: 37.8 % (ref 35.0–45.0)
Hemoglobin: 12.7 g/dL (ref 11.7–15.5)
Lymphs Abs: 2067 cells/uL (ref 850–3900)
MCH: 28.9 pg (ref 27.0–33.0)
MCHC: 33.6 g/dL (ref 32.0–36.0)
MCV: 85.9 fL (ref 80.0–100.0)
MPV: 10.1 fL (ref 7.5–12.5)
Monocytes Relative: 10.2 %
Neutro Abs: 4286 cells/uL (ref 1500–7800)
Neutrophils Relative %: 56.4 %
Platelets: 167 10*3/uL (ref 140–400)
RBC: 4.4 10*6/uL (ref 3.80–5.10)
RDW: 12.4 % (ref 11.0–15.0)
Total Lymphocyte: 27.2 %
WBC: 7.6 10*3/uL (ref 3.8–10.8)

## 2020-06-07 LAB — LIPASE: Lipase: 20 U/L (ref 7–60)

## 2020-06-09 NOTE — Progress Notes (Signed)
Labs look great! Everything has returned to normal, which is fantastic! Please let us know if you start to have any issues.

## 2020-07-15 ENCOUNTER — Telehealth (INDEPENDENT_AMBULATORY_CARE_PROVIDER_SITE_OTHER): Payer: 59 | Admitting: Sports Medicine

## 2020-07-15 ENCOUNTER — Other Ambulatory Visit: Payer: Self-pay | Admitting: Sports Medicine

## 2020-07-15 DIAGNOSIS — U071 COVID-19: Secondary | ICD-10-CM

## 2020-07-15 HISTORY — DX: COVID-19: U07.1

## 2020-07-15 MED ORDER — THERAFLU SEVERE COLD/CGH NIGHT 25-10-650 MG PO PACK: PACK | ORAL | Status: DC

## 2020-07-15 MED ORDER — DEXAMETHASONE 4 MG PO TABS: 4.0000 mg | ORAL_TABLET | Freq: Three times a day (TID) | ORAL | 0 refills | Status: DC

## 2020-07-15 MED ORDER — ALBUTEROL SULFATE HFA 108 (90 BASE) MCG/ACT IN AERS: 2.0000 | INHALATION_SPRAY | Freq: Four times a day (QID) | RESPIRATORY_TRACT | 11 refills | Status: DC | PRN

## 2020-07-15 MED FILL — ALBUTEROL SULFATE HFA 108 (: 108 (90 BAS | 25 days supply | Qty: 18 | Fill #0

## 2020-07-15 MED FILL — DEXAMETHASONE 4 MG TABLET: 4 | 5 days supply | Qty: 15 | Fill #0

## 2020-07-15 NOTE — Assessment & Plan Note (Signed)
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 Predominant symptoms are runny nose, scratchy throat, and insomnia, she is taking some Sudafed. She will add TheraFlu nighttime to help her sleep, and also 5 days of Decadron. She is complaining of minimal chest tightness, so we will add some albuterol as well. She is clinically stable however, and knows to call me should her symptoms worsen. She will need 5 days of quarantine since her diagnosis followed by mask for afterwards. Return to see me as needed.

## 2020-07-15 NOTE — Progress Notes (Signed)
   Virtual Visit via WebEx/MyChart   I connected with  Melissa Atkinson  on 07/15/20 via WebEx/MyChart/Doximity Video and verified that I am speaking with the correct person using two identifiers.   I discussed the limitations, risks, security and privacy concerns of performing an evaluation and management service by WebEx/MyChart/Doximity Video, including the higher likelihood of inaccurate diagnosis and treatment, and the availability of in person appointments.  We also discussed the likely need of an additional face to face encounter for complete and high quality delivery of care.  I also discussed with the patient that there may be a patient responsible charge related to this service. The patient expressed understanding and wishes to proceed.  Provider location is in medical facility. Patient location is at their home, different from provider location. People involved in care of the patient during this telehealth encounter were myself, my nurse/medical assistant, and my front office/scheduling team member.  Review of Systems: No fevers, chills, night sweats, weight loss, chest pain, or shortness of breath.   Objective Findings:    General: Speaking full sentences, no audible heavy breathing.  Sounds alert and appropriately interactive.  Appears well.  Face symmetric.  Extraocular movements intact.  Pupils equal and round.  No nasal flaring or accessory muscle use visualized.  Independent interpretation of tests performed by another provider:   None.  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 Predominant symptoms are runny nose, scratchy throat, and insomnia, she is taking some Sudafed. She will add TheraFlu nighttime to help Atkinson sleep, and also 5 days of  Decadron. She is complaining of minimal chest tightness, so we will add some albuterol as well. She is clinically stable however, and knows to call me should Atkinson symptoms worsen. She will need 5 days of quarantine since Atkinson diagnosis followed by mask for afterwards. Return to see me as needed.  I discussed the above assessment and treatment plan with the patient. The patient was provided an opportunity to ask questions and all were answered. The patient agreed with the plan and demonstrated an understanding of the instructions.   The patient was advised to call back or seek an in-person evaluation if the symptoms worsen or if the condition fails to improve as anticipated.   I provided 30 minutes of face to face and non-face-to-face time during this encounter date, time was needed to gather information, review chart, records, communicate/coordinate with staff remotely, as well as complete documentation.   ___________________________________________ Melissa Atkinson. Melissa Atkinson, M.D., ABFM., CAQSM. Primary Care and [place] Instructor of [place] [employer]

## 2020-07-15 NOTE — Progress Notes (Signed)
Pulse ox 95-99% niot labored. Sudafed 12h and Motrin for symptoms. Current symptoms: scratchy throat, sore throat, chest/thraot congested, sneezing. No wheezing, fever, vomiting, nausea comes and goes. No appetite. COVID vaccine x 3 Pfizer, flu vaccine as well.   [facility] outpt pharmacy

## 2020-07-21 MED FILL — HYDROCHLOROTHIAZIDE 12.5 MG: 12.5 | 90 days supply | Qty: 90 | Fill #2

## 2020-07-21 MED FILL — hydrOXYzine HCL 50 MG TABS: 50 | 30 days supply | Qty: 60 | Fill #1

## 2020-07-21 MED FILL — PANTOPRAZOLE SOD DR 40 MG T: 40 | 30 days supply | Qty: 60 | Fill #3

## 2020-07-21 MED FILL — PROMETHAZINE 25 MG TABLET: 25 | 8 days supply | Qty: 30 | Fill #1

## 2020-08-12 ENCOUNTER — Encounter: Payer: Self-pay | Admitting: *Deleted

## 2020-08-12 DIAGNOSIS — I712 Thoracic aortic aneurysm, without rupture, unspecified: Secondary | ICD-10-CM

## 2020-08-26 LAB — HM PAP SMEAR: HM Pap smear: NORMAL

## 2020-08-27 DIAGNOSIS — Z6833 Body mass index (BMI) 33.0-33.9, adult: Secondary | ICD-10-CM | POA: Diagnosis not present

## 2020-08-27 DIAGNOSIS — Z01419 Encounter for gynecological examination (general) (routine) without abnormal findings: Secondary | ICD-10-CM | POA: Diagnosis not present

## 2020-08-30 ENCOUNTER — Ambulatory Visit
Admission: RE | Admit: 2020-08-30 | Discharge: 2020-08-30 | Disposition: A | Discharge status: Home / Self Care | Payer: 59 | Source: Ambulatory Visit | Attending: Cardiology | Admitting: Cardiology

## 2020-08-30 ENCOUNTER — Other Ambulatory Visit: Payer: Self-pay

## 2020-08-30 DIAGNOSIS — I712 Thoracic aortic aneurysm, without rupture, unspecified: Secondary | ICD-10-CM

## 2020-08-30 MED ORDER — GADOBENATE DIMEGLUMINE 529 MG/ML IV SOLN
18.0000 mL | Freq: Once | INTRAVENOUS | Status: AC | PRN
  Administered 2020-08-30: 18 mL via INTRAVENOUS

## 2020-09-05 MED FILL — NEBIVOLOL HCL 10 MG TABS: 10 | 90 days supply | Qty: 90 | Fill #3

## 2020-09-17 DIAGNOSIS — Z13820 Encounter for screening for osteoporosis: Secondary | ICD-10-CM | POA: Diagnosis not present

## 2020-09-27 ENCOUNTER — Other Ambulatory Visit (HOSPITAL_COMMUNITY): Payer: Self-pay

## 2020-10-01 DIAGNOSIS — H524 Presbyopia: Secondary | ICD-10-CM | POA: Diagnosis not present

## 2020-10-07 ENCOUNTER — Other Ambulatory Visit (HOSPITAL_COMMUNITY): Payer: Self-pay

## 2020-10-08 ENCOUNTER — Other Ambulatory Visit: Payer: Self-pay | Admitting: Sports Medicine

## 2020-10-08 ENCOUNTER — Other Ambulatory Visit (HOSPITAL_COMMUNITY): Payer: Self-pay

## 2020-10-08 MED ORDER — TRIAMCINOLONE ACETONIDE 0.1 % EX CREA
1.0000 "application " | TOPICAL_CREAM | Freq: Two times a day (BID) | CUTANEOUS | 11 refills | Status: DC
  Filled 2020-10-08: qty 45, 23d supply, fill #0
  Filled 2021-09-21: qty 45, 23d supply, fill #1

## 2020-10-09 ENCOUNTER — Other Ambulatory Visit (HOSPITAL_COMMUNITY): Payer: Self-pay

## 2020-10-10 ENCOUNTER — Other Ambulatory Visit (HOSPITAL_COMMUNITY): Payer: Self-pay

## 2020-10-23 ENCOUNTER — Other Ambulatory Visit (HOSPITAL_COMMUNITY): Payer: Self-pay

## 2020-10-23 MED ORDER — HYDROXYZINE HCL 50 MG PO TABS
50.0000 mg | ORAL_TABLET | Freq: Every day | ORAL | 1 refills | Status: DC
  Filled 2020-10-23: qty 60, 30d supply, fill #0
  Filled 2021-01-24: qty 60, 30d supply, fill #1

## 2020-10-30 ENCOUNTER — Other Ambulatory Visit (HOSPITAL_COMMUNITY): Payer: Self-pay

## 2020-10-30 MED FILL — Hydrochlorothiazide Cap 12.5 MG: ORAL | 90 days supply | Qty: 90 | Fill #0 | Status: AC

## 2020-12-04 ENCOUNTER — Other Ambulatory Visit (HOSPITAL_COMMUNITY): Payer: Self-pay

## 2020-12-04 ENCOUNTER — Other Ambulatory Visit: Payer: Self-pay | Admitting: Sports Medicine

## 2020-12-04 DIAGNOSIS — Z87898 Personal history of other specified conditions: Secondary | ICD-10-CM

## 2020-12-04 MED ORDER — NEBIVOLOL HCL 10 MG PO TABS
10.0000 mg | ORAL_TABLET | Freq: Every day | ORAL | 3 refills | Status: DC
  Filled 2020-12-04: qty 90, 90d supply, fill #0
  Filled 2021-03-03: qty 90, 90d supply, fill #1
  Filled 2021-05-28: qty 90, 90d supply, fill #2
  Filled 2021-09-08: qty 90, 90d supply, fill #3

## 2021-01-07 ENCOUNTER — Other Ambulatory Visit (HOSPITAL_COMMUNITY): Payer: Self-pay

## 2021-01-07 ENCOUNTER — Encounter: Payer: Self-pay | Admitting: Medical-Surgical

## 2021-01-07 ENCOUNTER — Other Ambulatory Visit: Payer: Self-pay

## 2021-01-07 ENCOUNTER — Ambulatory Visit: Payer: 59 | Admitting: Medical-Surgical

## 2021-01-07 VITALS — BP 116/80 | HR 66 | Temp 98.9°F | Ht 65.0 in | Wt 212.5 lb

## 2021-01-07 DIAGNOSIS — Z6832 Body mass index (BMI) 32.0-32.9, adult: Secondary | ICD-10-CM

## 2021-01-07 DIAGNOSIS — Z7689 Persons encountering health services in other specified circumstances: Secondary | ICD-10-CM | POA: Diagnosis not present

## 2021-01-07 MED ORDER — OZEMPIC (0.25 OR 0.5 MG/DOSE) 2 MG/1.5ML ~~LOC~~ SOPN
0.2500 mg | PEN_INJECTOR | SUBCUTANEOUS | 0 refills | Status: DC
  Filled 2021-01-07: qty 1.5, 56d supply, fill #0

## 2021-01-07 NOTE — Patient Instructions (Addendum)
Weight loss tips and tricks:  1.  Make sure to drink at least 64 ounces of water every day. 2.  Avoid alcohol. 3.  Avoid eating within 3 hours of going to bed. 4.  Cut out sugary drinks such as sweet tea, regular sodas, energy drinks, etc. 5.  Make sure you are getting enough sleep every night. 6.  Start making changes by cutting back portion sizes by 1/3. 7.  Keep a food diary to help identify areas for improvement and promote awareness of bad habits. 8.  Increase your activity.  Choose something you like to do that is fun for you so you are more likely to stick with it. 9.  Do not forget your protein! 10.  Measure your neck, upper arms, waist, hips, and thighs and write those measurements down somewhere before you start your weight loss journey.  Changes in your measurements will tell a far more accurate story than the number on a scale. 11.  As you go, pay attention to how your clothes fit! 12.  Weigh yourself as often or as little as you need to.  Some folks do better weighing every day while others do better with once a week. 13.  Do not get discouraged!  Weight loss efforts are meant to be lifestyle changes.  Once you stop a medication (if you are taking 1), your behaviors and habits will determine if you maintain your weight loss or not.  For those on medications:  1.  Take your medication as prescribed every day first thing in the morning. 2.  Common side effects include nausea and constipation.  To combat this, increased your daily water consumption.  Consider adding in a stool softener (available OTC) if needed.  Also increase fiber intake with vegetables and fruits. 3.  If you have any side effects or concerns while taking medication, please do not hesitate to reach out to [place] here at the office. 4.  While on weight loss medications, we do require you to follow-up every 4 weeks with an in office visit.  Refills will not be called in early on controlled substances.  Good luck on your  weight loss journey!  Have faith in your self and you will reach your goals!  Semaglutide Injection (Weight Management) What is this medication? SEMAGLUTIDE (Sem a GLOO tide) is used to help people lose weight and maintainweight loss. It is used with a reduced-calorie diet and exercise. This medicine may be used for other purposes; ask your health care provider orpharmacist if you have questions. COMMON BRAND NAME(S): CYELYH What should I tell my care team before I take this medication? They need to know if you have any of these conditions: endocrine tumors (MEN 2) or if someone in your family had these tumors eye disease, vision problems gallbladder disease history of depression or mental health disease history of pancreatitis kidney disease stomach or intestine problems suicidal thoughts, plans, or attempt; a previous suicide attempt by you or a family member thyroid cancer or if someone in your family had thyroid cancer an unusual or allergic reaction to semaglutide, other medicines, foods, dyes, or preservatives pregnant or trying to get pregnant breast-feeding How should I use this medication? This medicine is injected under the skin. You will be taught how to prepare and give it. Take it as directed on the prescription label. It is given once every week (every 7 days). Keep taking it unless your health care provider tells youto stop. It is important that you  put your used needles and pens in a special sharps container. Do not put them in a trash can. If you do not have a sharpscontainer, call your pharmacist or health care provider to get one. A special MedGuide will be given to you by the pharmacist with eachprescription and refill. Be sure to read this information carefully each time. This medicine comes with INSTRUCTIONS FOR USE. Ask your pharmacist for directions on how to use this medicine. Read the information carefully. Talk toyour pharmacist or health care provider if you have  questions. Talk to your health care provider about the use of this medicine in children.Special care may be needed. Overdosage: If you think you have taken too much of this medicine contact apoison control center or emergency room at once. NOTE: This medicine is only for you. Do not share this medicine with others. What if I miss a dose? If you miss a dose and the next scheduled dose is more than 2 days away, take the missed dose as soon as possible. If you miss a dose and the next scheduled dose is less than 2 days away, do not take the missed dose. Take the next dose at your regular time. Do not take double or extra doses. If you miss your dose for 2 weeks or more, take the next dose at your regular time or call yourhealth care provider to talk about how to restart this medicine. What may interact with this medication? insulin and other medicines for diabetes This list may not describe all possible interactions. Give your health care provider a list of all the medicines, herbs, non-prescription drugs, or dietary supplements you use. Also tell them if you smoke, drink alcohol, or use illegaldrugs. Some items may interact with your medicine. What should I watch for while using this medication? Visit your health care provider for regular checks on your progress. It may besome time before you see the benefit from this medicine. Drink plenty of fluids while taking this medicine. Check with your health care provider if you have severe diarrhea, nausea, and vomiting, or if you sweat a lot. The loss of too much body fluid may make it dangerous for you to take thismedicine. This medicine may affect blood sugar levels. Ask your health care provider ifchanges in diet or medicines are needed if you have diabetes. If you or your family notice any changes in your behavior, such as new or worsening depression, thoughts of harming yourself, anxiety, other unusual ordisturbing thoughts, or memory loss, call your  health care provider right away. Women should inform their health care provider if they wish to become pregnant or think they might be pregnant. Losing weight while pregnant is not advised and may cause harm to the unborn child. Talk to your health care provider formore information. What side effects may I notice from receiving this medication? Side effects that you should report to your doctor or health care professionalas soon as possible: allergic reactions like skin rash, itching or hives, swelling of the face, lips, or tongue changes in vision fast heartbeat gallbladder problems (fever, upper belly pain, yellowing of the eyes or skin, clay-colored stool) kidney injury (trouble passing urine or change in the amount of urine) low blood sugar (feeling anxious; confusion; dizziness; increased hunger; unusually weak or tired; increased sweating; shakiness; cold, clammy skin; irritable; headache; blurred vision; loss of consciousness) lump or swelling on the neck pancreatitis (stomach pain that spreads to your back or gets worse after eating or when touched,  fever, nausea, vomiting) suicidal thoughts, mood changes trouble breathing trouble swallowing Side effects that usually do not require medical attention (report these toyour doctor or health care professional if they continue or are bothersome): constipation diarrhea headache heartburn (burning feeling in chest, often after eating or when lying down) nausea pain, redness, or irritation at site where injected passing gas upset stomach vomiting This list may not describe all possible side effects. Call your doctor for medical advice about side effects. You may report side effects to FDA at1-800-FDA-1088. Where should I keep my medication? Keep out of the reach of children and pets. Refrigeration (preferred): Store in the refrigerator. Do not freeze. Keep this medicine in the original container until you are ready to take it. Get rid  ofany unused medicine after the expiration date. Room temperature: If needed, prior to cap removal, the pen can be stored at room temperature for up to 28 days. Protect from light. If it is stored at room temperature, get rid of any unused medicine after 28 days or after it expires,whichever is first. It is important to get rid of the medicine as soon as you no longer need it orit is expired. You can do this in two ways: Take the medicine to a medicine take-back program. Check with your pharmacy or law enforcement to find a location. If you cannot return the medicine, follow the directions in the [place]. NOTE: This sheet is a summary. It may not cover all possible information. If you have questions about this medicine, talk to your doctor, pharmacist, orhealth care provider.  2022 Elsevier/Gold Standard (2019-12-04 11:38:13)

## 2021-01-07 NOTE — Progress Notes (Signed)
Subjective:    CC: Weight loss medication  HPI: Pleasant 50 year old female presenting today to discuss weight loss medication.  She has heard about Mounjaro and found that: Pharmacies are ordering a prescription for that at $25 per month.  She has not been taking any weight loss medications but is very interested in her options.  She is very emotional over that she is ever been.  She has tried multiple measures to lose weight and has been unsuccessful with each.  Notably, she has tried keto, weight watchers, calorie deficit, and increasing exercise.  She does note that she has the occasional beer or hamburger but most of the time makes healthy choices.  She has a protein shake, banana, and high-protein yogurt at work and then adds a sensible dinner.  She does walk at night with her husband but is not doing any strength training at this point.  I reviewed the past medical history, family history, social history, surgical history, and allergies today and no changes were needed.  Please see the problem list section below in epic for further details.  Past Medical History: Past Medical History:  Diagnosis Date   Anxiety    Back pain    Bicuspid aortic valve    Breast cancer ([place])    Cholelithiasis 01/2020   Depression    Joint pain    Kidney cyst, acquired    Obesity    Palpitations    Polycythemia vera (HCC)    PONV (postoperative nausea and vomiting)    Oct 31 2019 hernia with out problems   Thoracic aortic aneurysm [facility])    Thyroid nodule    Past Surgical History: Past Surgical History:  Procedure Laterality Date   BILIARY DILATION  02/14/2020   Procedure: BILIARY DILATION;  Surgeon: Ronnette Juniper, MD;  Location: [facility];  Service: Gastroenterology;;   [place] N/A 12/28/2012   Procedure: [place];  Surgeon: Cyril Mourning, MD;  Location: [facility];  Service: Gynecology;   Laterality: N/A;   ERCP N/A 02/14/2020   Procedure: ENDOSCOPIC RETROGRADE CHOLANGIOPANCREATOGRAPHY (ERCP);  Surgeon: Ronnette Juniper, MD;  Location: [facility];  Service: Gastroenterology;  Laterality: N/A;   LAPAROSCOPIC CHOLECYSTECTOMY SINGLE PORT N/A 02/13/2020   Procedure: LAPAROSCOPIC CHOLECYSTECTOMY, CHOLANGIOGRAM, LIVER BIOPSY;  Surgeon: Michael Boston, MD;  Location: [facility];  Service: General;  Laterality: N/A;   MASTECTOMY     bilateral   PARTIAL NEPHRECTOMY     RT    SPHINCTEROTOMY  02/14/2020   Procedure: SPHINCTEROTOMY;  Surgeon: Ronnette Juniper, MD;  Location: [facility];  Service: Gastroenterology;;   TONSILLECTOMY     TUBAL LIGATION     Social History: Social History   Socioeconomic History   Marital status: Married    Spouse name: Don   Number of children: 3   Years of education: Not on file   Highest education level: Not on file  Occupational History   Occupation: Programmer, multimedia: [employer]  Tobacco Use   Smoking status: Never   Smokeless tobacco: Never  Vaping Use   Vaping Use: Never used  Substance and Sexual Activity   Alcohol use: Yes    Comment: Occasional   Drug use: No   Sexual activity: Not on file  Other Topics Concern   Not on file  Social History Narrative   Not on file   Social Determinants of Health   Financial Resource Strain: Not on file  Food Insecurity: Not  on file  Transportation Needs: Not on file  Physical Activity: Not on file  Stress: Not on file  Social Connections: Not on file   Family History: Family History  Problem Relation Age of Onset   Cancer Mother        lung   Depression Mother    Anxiety disorder Mother    Bipolar disorder Mother    Alcoholism Mother    COPD Father    Congestive Heart Failure Father    Depression Father    Alcoholism Father    Stroke Maternal Grandfather    Allergies: No Known Allergies Medications: See med rec.  Review of Systems: See HPI for pertinent positives and negatives.    Objective:    General: Well Developed, well nourished, and in no acute distress.  Neuro: Alert and oriented x3.  HEENT: Normocephalic, atraumatic.  Skin: Warm and dry. Cardiac: Regular rate and rhythm, no murmurs rubs or gallops, no lower extremity edema.  Respiratory: Clear to auscultation bilaterally. Not using accessory muscles, speaking in full sentences.   Impression and Recommendations:    1. Encounter for weight management 2. Adult BMI 32.0-32.9 kg/sq m Discussed various options with patient.  Unfortunately Darcel Bayley is not indicated for weight loss according to FDA approval.  It is strictly being used for type 2 diabetes although they are seeking FDA approval to use it as a weight loss medication.  Our [facility] pharmacies are also honoring Wegovy at $25 a month so we will start her on that at the 0.25 mg dose.  Sending in [place] as Mancel Parsons is on backorder for 0.25 mg weekly.  Discussed recommendations for a well-balanced diet as well as protein intake.  Information provided to patient with AVS for weight loss tips and tricks.  Recommend increasing intentional exercise and adding in strength training.  Discussed various resources to help her along this journey.  Return in about 4 weeks (around 02/04/2021) for weight check. ___________________________________________ Clearnce Sorrel, DNP, APRN, FNP-BC Primary Care and [place]

## 2021-01-24 ENCOUNTER — Other Ambulatory Visit: Payer: Self-pay

## 2021-01-26 ENCOUNTER — Other Ambulatory Visit (HOSPITAL_COMMUNITY): Payer: Self-pay

## 2021-01-27 ENCOUNTER — Telehealth: Payer: Self-pay | Admitting: Cardiology

## 2021-01-27 ENCOUNTER — Other Ambulatory Visit: Payer: Self-pay | Admitting: Cardiology

## 2021-01-27 ENCOUNTER — Other Ambulatory Visit (HOSPITAL_COMMUNITY): Payer: Self-pay

## 2021-01-27 DIAGNOSIS — I10 Essential (primary) hypertension: Secondary | ICD-10-CM

## 2021-01-27 MED ORDER — HYDROCHLOROTHIAZIDE 12.5 MG PO CAPS
12.5000 mg | ORAL_CAPSULE | Freq: Every day | ORAL | 0 refills | Status: DC
  Filled 2021-01-27: qty 90, 90d supply, fill #0

## 2021-02-04 ENCOUNTER — Encounter: Payer: Self-pay | Admitting: Medical-Surgical

## 2021-02-04 ENCOUNTER — Other Ambulatory Visit (HOSPITAL_COMMUNITY): Payer: Self-pay

## 2021-02-04 ENCOUNTER — Ambulatory Visit: Payer: 59 | Admitting: Medical-Surgical

## 2021-02-04 VITALS — BP 112/87 | HR 65 | Resp 20 | Ht 65.0 in | Wt 204.2 lb

## 2021-02-04 DIAGNOSIS — Z7689 Persons encountering health services in other specified circumstances: Secondary | ICD-10-CM | POA: Diagnosis not present

## 2021-02-04 DIAGNOSIS — Z6832 Body mass index (BMI) 32.0-32.9, adult: Secondary | ICD-10-CM | POA: Diagnosis not present

## 2021-02-04 DIAGNOSIS — E669 Obesity, unspecified: Secondary | ICD-10-CM

## 2021-02-04 MED ORDER — OZEMPIC (0.25 OR 0.5 MG/DOSE) 2 MG/1.5ML ~~LOC~~ SOPN
0.5000 mg | PEN_INJECTOR | SUBCUTANEOUS | 0 refills | Status: DC
  Filled 2021-02-04 – 2021-02-16 (×2): qty 1.5, 28d supply, fill #0

## 2021-02-04 NOTE — Progress Notes (Signed)
  HPI with pertinent ROS:   CC: Weight check  HPI: Very pleasant 50 year old female presenting today for weight check on Ozempic 0.25 mg weekly.  She has completed 4 weeks of her injections and done very well with them.  She does have random intermittent flares of nausea but these are short-lived and self resolved.  She has noticed a decreased appetite and that is easily satisfied by small portions.  She has been working to get 8000 steps per day and has been doing swimming laps in the pool.  She has not had any issues with constipation and feels like she is doing well overall.  I reviewed the past medical history, family history, social history, surgical history, and allergies today and no changes were needed.  Please see the problem list section below in epic for further details.   Physical exam:   General: Well Developed, well nourished, and in no acute distress.  Neuro: Alert and oriented x3.  HEENT: Normocephalic, atraumatic.  Skin: Warm and dry. Cardiac: Regular rate and rhythm.  Respiratory: Not using accessory muscles, speaking in full sentences.  Impression and Recommendations:    1. Class 1 obesity with serious comorbidity and body mass index (BMI) of 32.0 to 32.9 in adult, unspecified obesity type 2. Encounter for weight management She is done exceedingly well at the lower dose of Ozempic.  Since she has completed her 4 weeks, we will increase to 0.5 mg weekly.  Total weight loss for the last month approximately 8.5 pounds.  Continue dietary and lifestyle modifications.  Continue regular exercise.  Return in about 4 weeks (around 03/04/2021) for weight check. ___________________________________________ Clearnce Sorrel, DNP, APRN, FNP-BC Primary Care and [place]

## 2021-02-11 ENCOUNTER — Other Ambulatory Visit (HOSPITAL_COMMUNITY): Payer: Self-pay

## 2021-02-16 ENCOUNTER — Other Ambulatory Visit (HOSPITAL_COMMUNITY): Payer: Self-pay

## 2021-03-04 ENCOUNTER — Ambulatory Visit: Payer: 59 | Admitting: Medical-Surgical

## 2021-03-04 ENCOUNTER — Encounter: Payer: Self-pay | Admitting: Medical-Surgical

## 2021-03-04 ENCOUNTER — Other Ambulatory Visit (HOSPITAL_COMMUNITY): Payer: Self-pay

## 2021-03-04 VITALS — BP 120/77 | HR 63 | Resp 20 | Ht 65.0 in | Wt 206.2 lb

## 2021-03-04 DIAGNOSIS — Z7689 Persons encountering health services in other specified circumstances: Secondary | ICD-10-CM

## 2021-03-04 MED ORDER — SEMAGLUTIDE (1 MG/DOSE) 4 MG/3ML ~~LOC~~ SOPN
1.0000 mg | PEN_INJECTOR | SUBCUTANEOUS | 0 refills | Status: DC
  Filled 2021-03-04: qty 3, 28d supply, fill #0

## 2021-03-04 NOTE — Progress Notes (Signed)
  HPI with pertinent ROS:   CC: Weight check  HPI: Very pleasant 50 year old female presenting today for weight check on Ozempic.  She has completed 4 weeks of 0.25 mg weekly as well as several weeks of 0.5 mg weekly.  She is tolerating the medication well but unfortunately she has developed some constipation over the last week.  She does have a noticeably decreased appetite and cravings but is having difficulty with drinking water throughout the day.  She has not been exercising but over the past few weeks due to moving from their apartment into their home.  She does not weigh at home regularly.  Previously used an app to log her food intake and noted less than 1000 cal/day.  Has not been logging foods over the last few weeks.  She will be going to the beach today for a friend's birthday celebration and will come back on Sunday.  She does have weights at home and plans to start doing some exercises when she gets back.  Notes that she is a bit disappointed today by the lack of weight loss.  Despite her disappointment today, she is still very motivated for weight loss and has a goal to be under 200 pounds when she returns in 4 weeks.  I reviewed the past medical history, family history, social history, surgical history, and allergies today and no changes were needed.  Please see the problem list section below in epic for further details.   Physical exam:   General: Well Developed, well nourished, and in no acute distress.  Neuro: Alert and oriented x3.  HEENT: Normocephalic, atraumatic.  Skin: Warm and dry. Cardiac: Regular rate and rhythm.  Respiratory:  Not using accessory muscles, speaking in full sentences.  Impression and Recommendations:    1. Encounter for weight management Overall, she has done well on Ozempic and is tolerating the medication.  Increasing her dose to 1 mg weekly for the next 4 weeks.  Discussed in detail measures to take to increase water consumption.  Consider  over-the-counter stool softeners or fiber supplements to combat constipation.  Recommend starting exercise that incorporates cardiovascular activities as well as strength/resistance training.  Recommend returning to logging foods and aiming for 1200-1500 cal/day.  Return in about 4 weeks (around 04/01/2021) for weight check. ___________________________________________ Clearnce Sorrel, DNP, APRN, FNP-BC Primary Care and [place]

## 2021-03-09 ENCOUNTER — Other Ambulatory Visit (HOSPITAL_COMMUNITY): Payer: Self-pay

## 2021-03-22 ENCOUNTER — Other Ambulatory Visit: Payer: Self-pay | Admitting: Nurse Practitioner

## 2021-03-22 ENCOUNTER — Other Ambulatory Visit (HOSPITAL_COMMUNITY): Payer: Self-pay

## 2021-03-23 ENCOUNTER — Other Ambulatory Visit (HOSPITAL_COMMUNITY): Payer: Self-pay

## 2021-03-25 ENCOUNTER — Other Ambulatory Visit (HOSPITAL_COMMUNITY): Payer: Self-pay

## 2021-03-25 NOTE — Progress Notes (Signed)
HPI: FU bicuspid aortic valve. Stress echocardiogram in March of 2013 showed normal LV function, probable bicuspid aortic valve and no stress-induced wall motion abnormalities. Monitor in March of 2013 showed sinus rhythm with PACs and short bursts of SVT but no sustained arrhythmias. MRA of cerebral vasculature February 2016 showed no aneurysm.  Monitor January 2020 showed sinus rhythm, sinus tachycardia, ectopic atrial rhythm, brief ectopic atrial tachycardia and 7 beats nonsustained ventricular tachycardia. Echocardiogram repeated March 2020 and showed normal LV function, mild aortic stenosis with mean gradient 8 mmHg.  Renal Dopplers March 2020 showed no renal artery stenosis.  MRA March 2022 showed ectasia of the ascending thoracic aorta at 35 mm.  Since she was last seen she occasionally has palpitations unchanged.  She denies dyspnea, chest pain or syncope.  Current Outpatient Medications  Medication Sig Dispense Refill   hydrochlorothiazide (MICROZIDE) 12.5 MG capsule Take 1 capsule (12.5 mg total) by mouth daily. 90 capsule 0   hydrOXYzine (ATARAX/VISTARIL) 50 MG tablet 1 tablet (50 mg total) at bedtime ,may repeat dose 1 time if needed for itching 30 tablet 0   LORazepam (ATIVAN) 0.5 MG tablet Take 1 tablet (0.5 mg total) by mouth 2 (two) times daily as needed for anxiety. 30 tablet 0   losartan (COZAAR) 25 MG tablet TAKE 1 TABLET BY MOUTH DAILY. 90 tablet 3   nebivolol (BYSTOLIC) 10 MG tablet Take 1 tablet (10 mg total) by mouth daily. 90 tablet 3   promethazine (PHENERGAN) 25 MG tablet Take 1 tablet by mouth 2 (two) times daily as needed.     Semaglutide-Weight Management 1.7 MG/0.75ML SOAJ Inject 1.7 mg into the skin once a week. 3 mL 1   triamcinolone cream (KENALOG) 0.1 % Apply 1 application topically 2 (two) times daily. 45 g 11   pantoprazole (PROTONIX) 40 MG tablet TAKE 1 TABLET BY MOUTH TWICE DAILY. 60 tablet 3   No current facility-administered medications for this visit.      Past Medical History:  Diagnosis Date   Anxiety    Back pain    Bicuspid aortic valve    Breast cancer ([place])    Cholelithiasis 01/2020   Depression    Joint pain    Kidney cyst, acquired    Obesity    Palpitations    Polycythemia vera ([facility])    PONV (postoperative nausea and vomiting)    Oct 31 2019 hernia with out problems   Thoracic aortic aneurysm    Thyroid nodule     Past Surgical History:  Procedure Laterality Date   BILIARY DILATION  02/14/2020   Procedure: BILIARY DILATION;  Surgeon: Ronnette Juniper, MD;  Location: [facility];  Service: Gastroenterology;;   [place] N/A 12/28/2012   Procedure: [place];  Surgeon: Cyril Mourning, MD;  Location: [facility];  Service: Gynecology;  Laterality: N/A;   ERCP N/A 02/14/2020   Procedure: ENDOSCOPIC RETROGRADE CHOLANGIOPANCREATOGRAPHY (ERCP);  Surgeon: Ronnette Juniper, MD;  Location: [facility];  Service: Gastroenterology;  Laterality: N/A;   LAPAROSCOPIC CHOLECYSTECTOMY SINGLE PORT N/A 02/13/2020   Procedure: LAPAROSCOPIC CHOLECYSTECTOMY, CHOLANGIOGRAM, LIVER BIOPSY;  Surgeon: Michael Boston, MD;  Location: [facility];  Service: General;  Laterality: N/A;   MASTECTOMY     bilateral   PARTIAL NEPHRECTOMY     RT    SPHINCTEROTOMY  02/14/2020   Procedure: SPHINCTEROTOMY;  Surgeon: Ronnette Juniper, MD;  Location: [facility];  Service: Gastroenterology;;   TONSILLECTOMY  TUBAL LIGATION      Social History   Socioeconomic History   Marital status: Married    Spouse name: Timmothy Sours   Number of children: 3   Years of education: Not on file   Highest education level: Not on file  Occupational History   Occupation: Programmer, multimedia: [employer]  Tobacco Use   Smoking status: Never   Smokeless tobacco: Never  Vaping Use   Vaping Use: Never used  Substance and Sexual Activity   Alcohol use: Yes    Comment: Occasional   Drug use: No    Sexual activity: Not on file  Other Topics Concern   Not on file  Social History Narrative   Not on file   Social Determinants of Health   Financial Resource Strain: Not on file  Food Insecurity: Not on file  Transportation Needs: Not on file  Physical Activity: Not on file  Stress: Not on file  Social Connections: Not on file  Intimate Partner Violence: Not on file    Family History  Problem Relation Age of Onset   Cancer Mother        lung   Depression Mother    Anxiety disorder Mother    Bipolar disorder Mother    Alcoholism Mother    COPD Father    Congestive Heart Failure Father    Depression Father    Alcoholism Father    Stroke Maternal Grandfather     ROS: no fevers or chills, productive cough, hemoptysis, dysphasia, odynophagia, melena, hematochezia, dysuria, hematuria, rash, seizure activity, orthopnea, PND, pedal edema, claudication. Remaining systems are negative.  Physical Exam: Well-developed well-nourished in no acute distress.  Skin is warm and dry.  HEENT is normal.  Neck is supple.  Chest is clear to auscultation with normal expansion.  Cardiovascular exam is regular rate and rhythm.  Abdominal exam nontender or distended. No masses palpated. Extremities show no edema. neuro grossly intact  ECG-normal sinus rhythm at a rate of 71, nonspecific ST changes  Personally reviewed  A/P  1 hypertension-patient's blood pressure is elevated; she states it is occasionally low at home.  I would prefer her to be on an ARB.  Discontinue hydrochlorothiazide and treat with losartan 25 mg daily.  Check potassium and renal function in 1 week.  2 bicuspid aortic valve-we will repeat echocardiogram March 2023.  2 ectatic aortic root-we will plan CTA March 2024.  4 palpitations-continue beta-blocker.  Kirk Ruths, MD

## 2021-03-26 ENCOUNTER — Other Ambulatory Visit: Payer: Self-pay | Admitting: Medical-Surgical

## 2021-03-26 ENCOUNTER — Other Ambulatory Visit (HOSPITAL_COMMUNITY): Payer: Self-pay

## 2021-03-26 MED ORDER — HYDROXYZINE HCL 50 MG PO TABS
50.0000 mg | ORAL_TABLET | Freq: Every day | ORAL | 0 refills | Status: DC
Start: 2021-03-26 — End: 2021-05-28
  Filled 2021-03-26: qty 30, 30d supply, fill #0

## 2021-04-01 ENCOUNTER — Other Ambulatory Visit (HOSPITAL_COMMUNITY): Payer: Self-pay

## 2021-04-01 ENCOUNTER — Ambulatory Visit: Payer: 59 | Admitting: Cardiology

## 2021-04-01 ENCOUNTER — Encounter: Payer: Self-pay | Admitting: Medical-Surgical

## 2021-04-01 ENCOUNTER — Encounter: Payer: Self-pay | Admitting: Cardiology

## 2021-04-01 ENCOUNTER — Other Ambulatory Visit: Payer: Self-pay

## 2021-04-01 ENCOUNTER — Ambulatory Visit: Payer: 59 | Admitting: Medical-Surgical

## 2021-04-01 VITALS — BP 129/93 | HR 71 | Ht 65.0 in | Wt 206.1 lb

## 2021-04-01 VITALS — BP 136/90 | HR 64 | Resp 20 | Ht 65.0 in | Wt 204.9 lb

## 2021-04-01 DIAGNOSIS — Q231 Congenital insufficiency of aortic valve: Secondary | ICD-10-CM | POA: Diagnosis not present

## 2021-04-01 DIAGNOSIS — I77810 Thoracic aortic ectasia: Secondary | ICD-10-CM

## 2021-04-01 DIAGNOSIS — I712 Thoracic aortic aneurysm, without rupture, unspecified: Secondary | ICD-10-CM

## 2021-04-01 DIAGNOSIS — I10 Essential (primary) hypertension: Secondary | ICD-10-CM | POA: Diagnosis not present

## 2021-04-01 DIAGNOSIS — R002 Palpitations: Secondary | ICD-10-CM

## 2021-04-01 DIAGNOSIS — Z7689 Persons encountering health services in other specified circumstances: Secondary | ICD-10-CM

## 2021-04-01 MED ORDER — LOSARTAN POTASSIUM 25 MG PO TABS
25.0000 mg | ORAL_TABLET | Freq: Every day | ORAL | 3 refills | Status: DC
  Filled 2021-04-01: qty 90, 90d supply, fill #0
  Filled 2021-07-05: qty 90, 90d supply, fill #1
  Filled 2021-10-19: qty 90, 90d supply, fill #2
  Filled 2022-01-21: qty 90, 90d supply, fill #3

## 2021-04-01 MED ORDER — SEMAGLUTIDE-WEIGHT MANAGEMENT 1.7 MG/0.75ML ~~LOC~~ SOAJ
1.7000 mg | SUBCUTANEOUS | 1 refills | Status: DC
  Filled 2021-04-01 – 2021-04-03 (×2): qty 3, 28d supply, fill #0
  Filled 2021-05-01: qty 3, 28d supply, fill #1

## 2021-04-01 NOTE — Patient Instructions (Signed)
Medication Instructions:   STOP HCTZ  START LOSARTAN 25 MG ONCE DAILY  *If you need a refill on your cardiac medications before your next appointment, please call your pharmacy*   Lab Work:  Your physician recommends that you return for lab work in: [place]  If you have labs (blood work) drawn today and your tests are completely normal, you will receive your results only by: [employer] (if you have MyChart) OR A paper copy in the mail If you have any lab test that is abnormal or we need to change your treatment, we will call you to review the results.   Testing/Procedures:  Your physician has requested that you have an echocardiogram. Echocardiography is a painless test that uses sound waves to create images of your heart. It provides your doctor with information about the size and shape of your heart and how well your heart's chambers and valves are working. This procedure takes approximately one hour. There are no restrictions for this procedure. [place] [facility]-1 ST FLOOR IMAGING DEPARTMENT - SCHEDULE IN MARCH 2023    Follow-Up: At [facility], you and your health needs are our priority.  As part of our continuing mission to provide you with exceptional heart care, we have created designated Provider Care Teams.  These Care Teams include your primary Cardiologist (physician) and Advanced Practice Providers (APPs -  Physician Assistants and Nurse Practitioners) who all work together to provide you with the care you need, when you need it.  We recommend signing up for the patient portal called "MyChart".  Sign up information is provided on this After Visit Summary.  MyChart is used to connect with patients for Virtual Visits (Telemedicine).  Patients are able to view lab/test results, encounter notes, upcoming appointments, etc.  Non-urgent messages can be sent to your provider as well.   To learn more about what you can do with MyChart, go to [email or web address].     Your next appointment:   12 month(s)  The format for your next appointment:   In Person  Provider:   Kirk Ruths, MD

## 2021-04-01 NOTE — Progress Notes (Signed)
  HPI with pertinent ROS:   CC: Weight check  HPI: 50 year old female presenting today for weight check on Ozempic.  She has done very well with the medications and really likes the effects that she has had so far.  She has completed 4 weeks of 1 mg injections weekly.  Notes that not only does she have a decreased appetite but she no longer has the cravings that she once did.  She also has noted that when she does have a craving for food that is on the unhealthy or food list, she is only able to tolerate a few bites before she no longer wants it.  Is walking daily and doing yoga but has not incorporated strength training so far.  She does have YouTube TV which is very easy to use and helps keep her on track.  She is logging her foods into an app and making sure to weigh her portions.  Denies difficulty with sleeping.  I reviewed the past medical history, family history, social history, surgical history, and allergies today and no changes were needed.  Please see the problem list section below in epic for further details.   Physical exam:   General: Well Developed, well nourished, and in no acute distress.  Neuro: Alert and oriented x3.  HEENT: Normocephalic, atraumatic.  Skin: Warm and dry. Cardiac: Regular rate and rhythm, no murmurs rubs or gallops, no lower extremity edema.  Respiratory: Clear to auscultation bilaterally. Not using accessory muscles, speaking in full sentences.  Impression and Recommendations:    1. Encounter for weight management A little over 1 pound lost in the past 4 weeks however she does note her clothes are fitting quite a bit different as she has lost inches.  Increasing Wegovy to 1.7 mg weekly.  She would like to stay here for 8 weeks rather than 4.  Continue dietary and lifestyle modifications.  Incorporate strength training 2-3 times weekly.  Return in about 8 weeks (around 05/27/2021) for weight check. ___________________________________________ Clearnce Sorrel, DNP, APRN, FNP-BC Primary Care and [place]

## 2021-04-03 ENCOUNTER — Other Ambulatory Visit (HOSPITAL_COMMUNITY): Payer: Self-pay

## 2021-04-03 ENCOUNTER — Telehealth: Payer: Self-pay

## 2021-04-03 NOTE — Telephone Encounter (Signed)
Received phone call from [facility] concerning [facility] authorization.   Completed PA. Received approval from 04/03/21 - 04/02/2022 for WEGOVY 1.7MG/0.75ML PEN.  Called patient and advised of approval.

## 2021-04-24 DIAGNOSIS — I10 Essential (primary) hypertension: Secondary | ICD-10-CM | POA: Diagnosis not present

## 2021-04-25 LAB — BASIC METABOLIC PANEL
BUN: 10 mg/dL (ref 7–25)
CO2: 32 mmol/L (ref 20–32)
Calcium: 9.4 mg/dL (ref 8.6–10.4)
Chloride: 104 mmol/L (ref 98–110)
Creat: 0.8 mg/dL (ref 0.50–1.03)
Glucose, Bld: 86 mg/dL (ref 65–99)
Potassium: 4.5 mmol/L (ref 3.5–5.3)
Sodium: 140 mmol/L (ref 135–146)

## 2021-05-04 ENCOUNTER — Other Ambulatory Visit (HOSPITAL_COMMUNITY): Payer: Self-pay

## 2021-05-20 ENCOUNTER — Encounter: Payer: Self-pay | Admitting: Medical-Surgical

## 2021-05-20 ENCOUNTER — Other Ambulatory Visit (HOSPITAL_COMMUNITY): Payer: Self-pay

## 2021-05-20 ENCOUNTER — Telehealth: Payer: Self-pay

## 2021-05-20 ENCOUNTER — Ambulatory Visit: Payer: 59 | Admitting: Medical-Surgical

## 2021-05-20 ENCOUNTER — Other Ambulatory Visit: Payer: Self-pay

## 2021-05-20 VITALS — BP 110/74 | HR 67 | Resp 20 | Ht 65.0 in | Wt 196.5 lb

## 2021-05-20 DIAGNOSIS — Z7689 Persons encountering health services in other specified circumstances: Secondary | ICD-10-CM

## 2021-05-20 DIAGNOSIS — M722 Plantar fascial fibromatosis: Secondary | ICD-10-CM | POA: Diagnosis not present

## 2021-05-20 MED ORDER — SEMAGLUTIDE-WEIGHT MANAGEMENT 2.4 MG/0.75ML ~~LOC~~ SOAJ
2.4000 mg | SUBCUTANEOUS | 0 refills | Status: DC
  Filled 2021-05-20: qty 9, 84d supply, fill #0
  Filled 2021-05-29: qty 3, 28d supply, fill #0
  Filled 2021-06-29: qty 3, 28d supply, fill #1
  Filled 2021-07-20: qty 3, 28d supply, fill #2

## 2021-05-20 NOTE — Progress Notes (Signed)
  HPI with pertinent ROS:   CC: Weight management  HPI: Very pleasant 50 year old female presenting today for follow-up on weight management.  She has been using semaglutide 1.7 mg weekly as prescribed, tolerating well overall although she has had some nausea at times.  Notes the nausea is manageable with Phenergan and Zofran used as needed.  Has been focusing more on protein intake and making sure that she is getting at least 20 to 30 g of protein with each meal.  Has continued to exercise regularly and has been walking for the majority of her activity.  Unfortunately, over the last week, she has had a severe pain in her left heel that feels like she is stepping on a nail.  I reviewed the past medical history, family history, social history, surgical history, and allergies today and no changes were needed.  Please see the problem list section below in epic for further details.   Physical exam:   General: Well Developed, well nourished, and in no acute distress.  Neuro: Alert and oriented x3.  HEENT: Normocephalic, atraumatic.  Skin: Warm and dry. Cardiac: Regular rate and rhythm, no murmurs rubs or gallops, no lower extremity edema.  Respiratory: Clear to auscultation bilaterally. Not using accessory muscles, speaking in full sentences.  Impression and Recommendations:    1. Encounter for weight management She is done very well with a 10 pound weight loss on semaglutide 1.7 mg weekly as prescribed.  She is managing nausea well and would like to go up to the next dose.  Sending in semaglutide 2.4 mg weekly.  Advised to monitor for worsening side effects.  Continue dietary modifications.  Resume exercise as tolerated.  2. Plantar fasciitis Heel lifts provided along with home exercises.  Discussed supportive shoes and use of anti-inflammatories.  Patient would like to use ibuprofen at home rather than have a prescription at this time.  Return in about 2 months (around 07/20/2021) for weight  check. ___________________________________________ Clearnce Sorrel, DNP, APRN, FNP-BC Primary Care and [place]

## 2021-05-20 NOTE — Telephone Encounter (Signed)
Medication: Semaglutide-Weight Management 2.4 MG/0.75ML SOAJ Prior authorization submitted via CoverMyMeds on 05/20/2021 PA submission pending

## 2021-05-28 ENCOUNTER — Other Ambulatory Visit: Payer: Self-pay | Admitting: Medical-Surgical

## 2021-05-28 ENCOUNTER — Other Ambulatory Visit: Payer: Self-pay | Admitting: Nurse Practitioner

## 2021-05-28 ENCOUNTER — Other Ambulatory Visit (HOSPITAL_COMMUNITY): Payer: Self-pay

## 2021-05-28 DIAGNOSIS — F410 Panic disorder [episodic paroxysmal anxiety] without agoraphobia: Secondary | ICD-10-CM

## 2021-05-28 MED ORDER — HYDROXYZINE HCL 50 MG PO TABS
50.0000 mg | ORAL_TABLET | Freq: Every day | ORAL | 0 refills | Status: DC
Start: 2021-05-28 — End: 2021-07-05
  Filled 2021-05-28: qty 30, 15d supply, fill #0

## 2021-05-28 NOTE — Telephone Encounter (Signed)
Medication: Semaglutide-Weight Management 2.4 MG/0.75ML SOAJ Prior authorization determination received Medication has been approved Approval dates: 05/20/2021-05/19/2022  Patient aware via: [place] aware: Yes Provider aware via this encounter

## 2021-05-29 ENCOUNTER — Other Ambulatory Visit (HOSPITAL_COMMUNITY): Payer: Self-pay

## 2021-05-29 MED ORDER — LORAZEPAM 0.5 MG PO TABS
0.5000 mg | ORAL_TABLET | Freq: Two times a day (BID) | ORAL | 0 refills | Status: DC | PRN
  Filled 2021-05-29: qty 30, 15d supply, fill #0

## 2021-06-29 ENCOUNTER — Other Ambulatory Visit (HOSPITAL_COMMUNITY): Payer: Self-pay

## 2021-06-30 ENCOUNTER — Other Ambulatory Visit (HOSPITAL_COMMUNITY): Payer: Self-pay

## 2021-06-30 MED ORDER — CARESTART COVID-19 HOME TEST VI KIT
PACK | 0 refills | Status: DC
  Filled 2021-06-30: qty 4, 4d supply, fill #0

## 2021-07-05 ENCOUNTER — Other Ambulatory Visit: Payer: Self-pay | Admitting: Medical-Surgical

## 2021-07-06 ENCOUNTER — Other Ambulatory Visit (HOSPITAL_COMMUNITY): Payer: Self-pay

## 2021-07-06 MED ORDER — HYDROXYZINE HCL 50 MG PO TABS
50.0000 mg | ORAL_TABLET | Freq: Every day | ORAL | 0 refills | Status: DC
  Filled 2021-07-06: qty 30, 30d supply, fill #0

## 2021-07-20 ENCOUNTER — Other Ambulatory Visit (HOSPITAL_COMMUNITY): Payer: Self-pay

## 2021-07-21 ENCOUNTER — Other Ambulatory Visit (HOSPITAL_COMMUNITY): Payer: Self-pay

## 2021-07-22 ENCOUNTER — Ambulatory Visit: Payer: 59 | Admitting: Medical-Surgical

## 2021-07-22 ENCOUNTER — Encounter: Payer: Self-pay | Admitting: Medical-Surgical

## 2021-07-22 ENCOUNTER — Other Ambulatory Visit (HOSPITAL_COMMUNITY): Payer: Self-pay

## 2021-07-22 ENCOUNTER — Other Ambulatory Visit: Payer: Self-pay

## 2021-07-22 VITALS — BP 113/78 | HR 78 | Resp 20 | Ht 65.0 in | Wt 191.3 lb

## 2021-07-22 DIAGNOSIS — E661 Drug-induced obesity: Secondary | ICD-10-CM | POA: Diagnosis not present

## 2021-07-22 DIAGNOSIS — Z6831 Body mass index (BMI) 31.0-31.9, adult: Secondary | ICD-10-CM | POA: Diagnosis not present

## 2021-07-22 MED ORDER — SEMAGLUTIDE-WEIGHT MANAGEMENT 2.4 MG/0.75ML ~~LOC~~ SOAJ
2.4000 mg | SUBCUTANEOUS | 1 refills | Status: DC
  Filled 2021-07-22 – 2021-08-25 (×3): qty 9, 84d supply, fill #0
  Filled 2021-08-27: qty 3, 28d supply, fill #0
  Filled 2021-09-21: qty 3, 28d supply, fill #1
  Filled 2021-10-19: qty 3, 28d supply, fill #2
  Filled 2021-11-14: qty 3, 28d supply, fill #3
  Filled 2021-12-09: qty 3, 28d supply, fill #4
  Filled 2022-01-21: qty 3, 28d supply, fill #5

## 2021-07-22 NOTE — Progress Notes (Signed)
°  HPI with pertinent ROS:   CC: Weight check  HPI: Very pleasant 51 year old female presenting today for weight check on Wegovy.  She has been on the 2.4 mg dose of Wegovy once weekly for the past 2 to 3 months.  She has tolerated the medication exceedingly well and had no significant side effects.  She does have occasional loose stools but wonders if this is related to the medication or if it is more related to having her gallbladder removed.  She is doing the fitness center and has been swimming, walking track, and using the weight machines.  She has dumbbells at home that she is using regularly.  She has stopped drinking alcohol for the month of January and notes this may be helping her weight loss.  She still struggles to get her daily water in but is making accommodations for that.  Continues to have a decreased appetite and has no desire to snack.  Instead of snacking at bedtime, she is drinking hot tea which she reports satisfies the craving for sweet.  I reviewed the past medical history, family history, social history, surgical history, and allergies today and no changes were needed.  Please see the problem list section below in epic for further details.   Physical exam:   General: Well Developed, well nourished, and in no acute distress.  Neuro: Alert and oriented x3.  HEENT: Normocephalic, atraumatic.  Skin: Warm and dry. Cardiac: Regular rate and rhythm.  Respiratory: Not using accessory muscles, speaking in full sentences.  Impression and Recommendations:    1. Class 1 drug-induced obesity with serious comorbidity and body mass index (BMI) of 31.0 to 31.9 in adult Fiana has done very well with her weight loss efforts, total of 21 pounds lost so far.  She has made dietary and lifestyle modifications as recommended and continues to use [facility] with no concerns.  At this point, would recommend continuing her exercise and dietary changes.  Continue Wegovy 2.4 mg weekly.  Advised that  this can be managed by her PCP but I will be more than happy to continue this if she desires.  She is overdue for health maintenance and so encouraged her to get scheduled for a physical appointment and annual labs with her PCP at her earliest convenience.  Return for weight check in 4-6 months: Annual physical exam with PCP at earliest convenience. ___________________________________________ Clearnce Sorrel, DNP, APRN, FNP-BC Primary Care and [place]

## 2021-08-01 IMAGING — DX DG LUMBAR SPINE COMPLETE 4+V
5 series · 5 of 5 positions shown · non-contrast
Comparison: 10/29/2013

CLINICAL DATA: Right flank pain for 2 weeks.  No injury.

EXAM:
LUMBAR SPINE - COMPLETE 4+ VIEW

[l-spine ap]
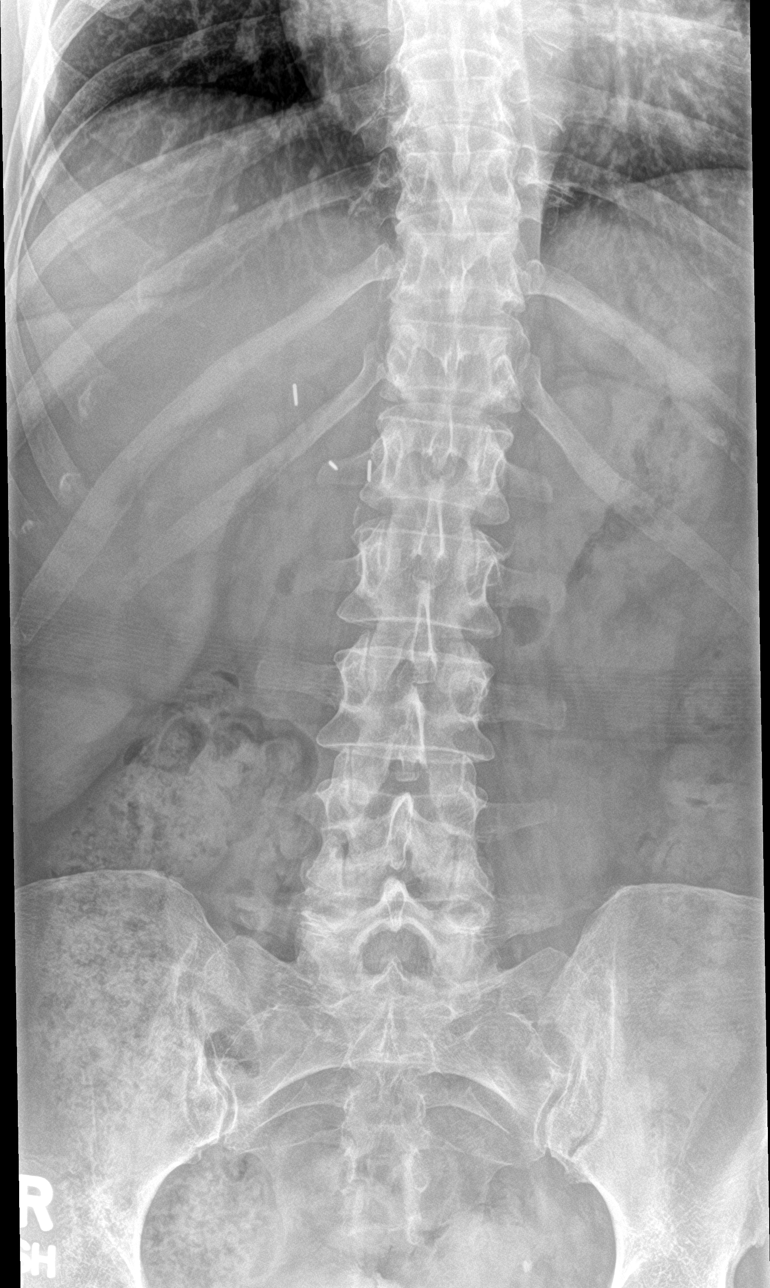

[l-spine obl (1 of 2)]
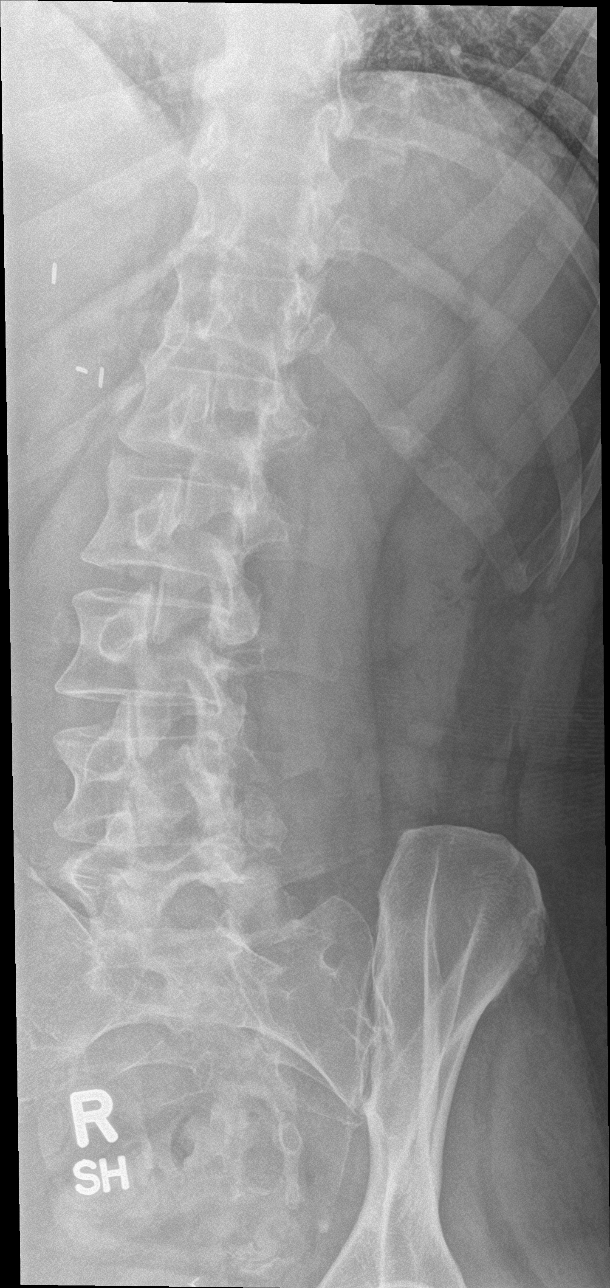

[l-spine obl (2 of 2)]
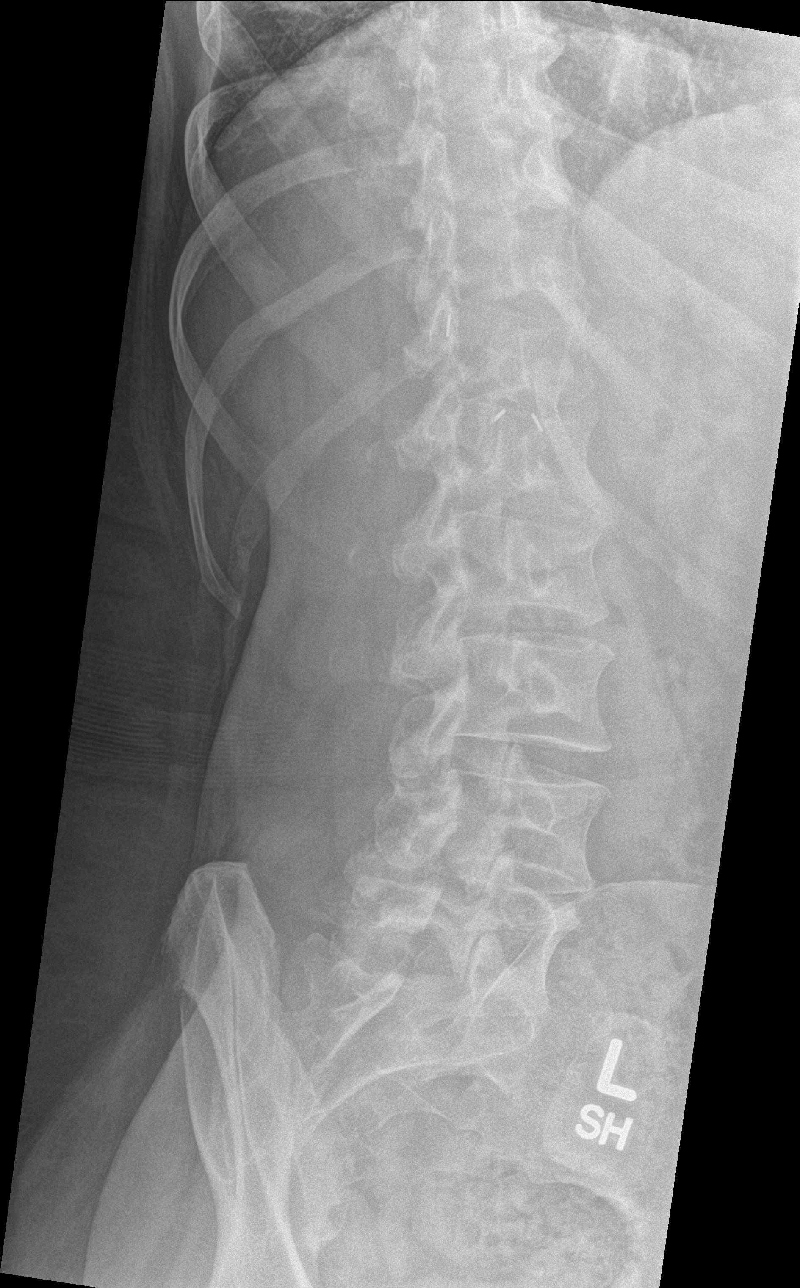

[l-spine lat]
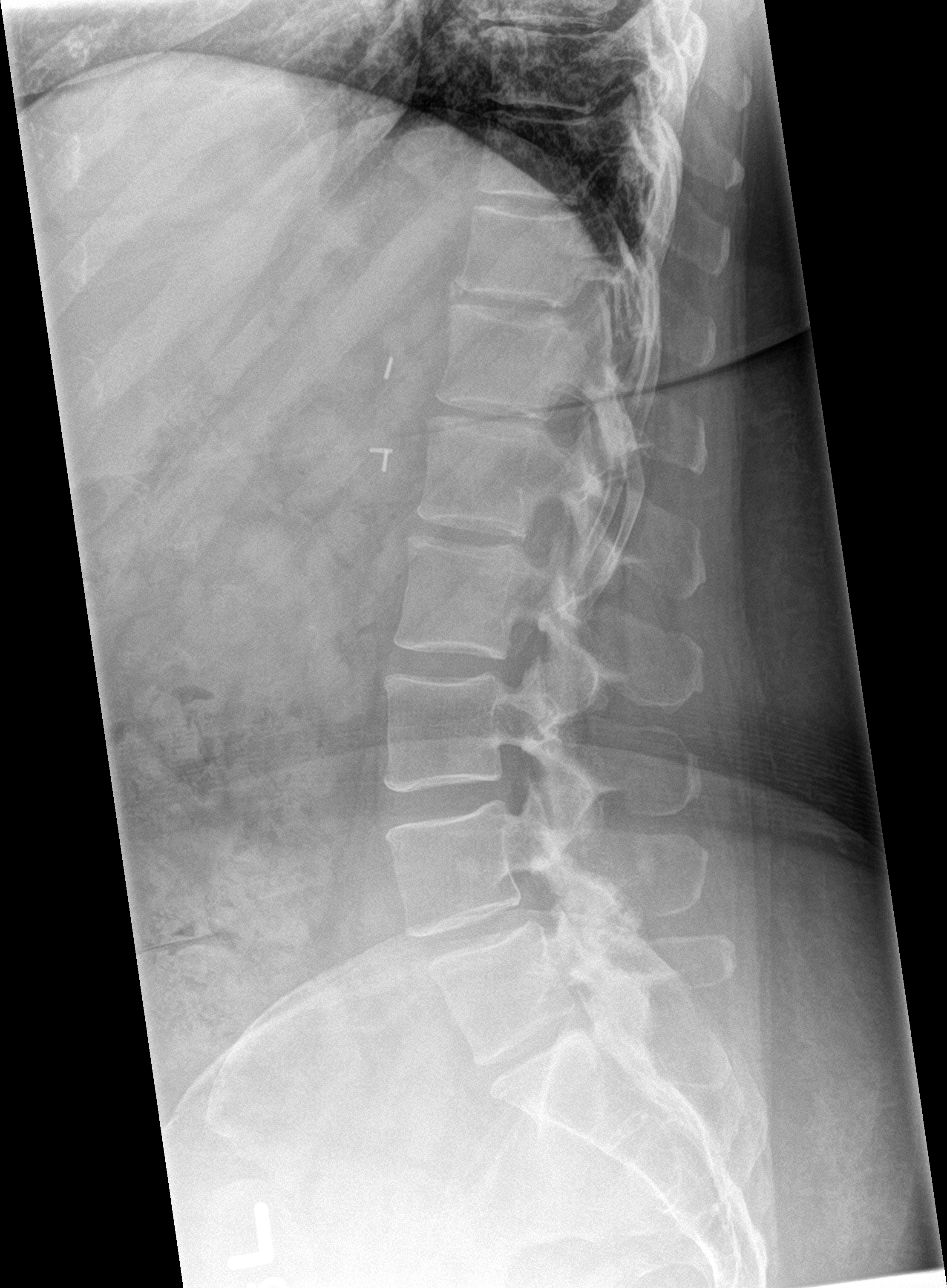

[l-spine spot]
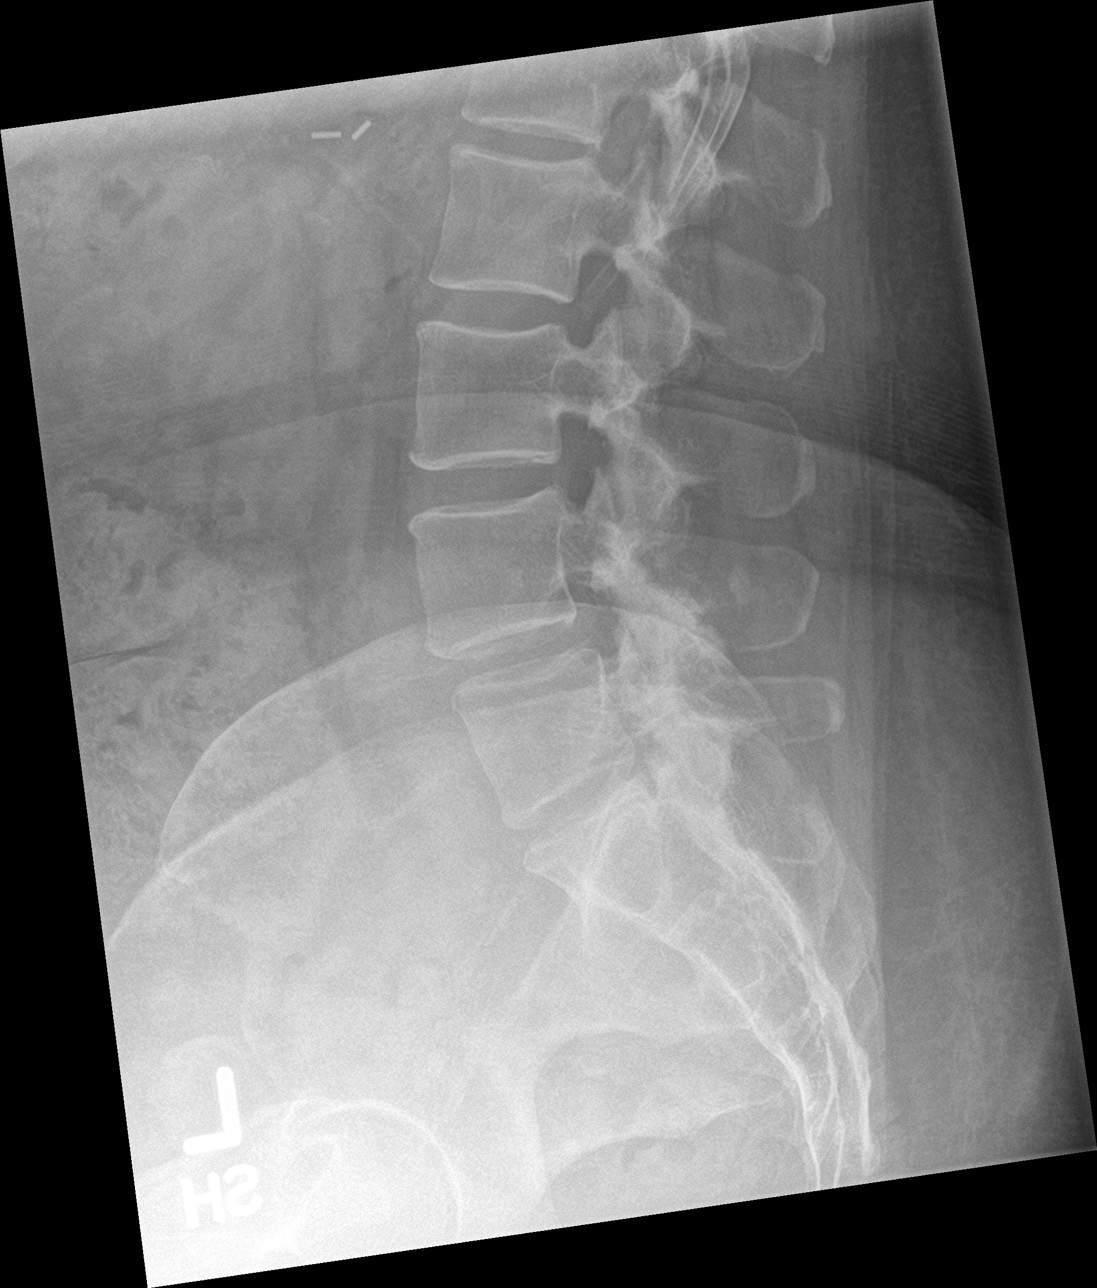

[5 of 5 positions shown; findings below may reference images not displayed]

FINDINGS: No fracture or bone lesion.

Grade 1 anterolisthesis of L4 on L5.  No other spondylolisthesis.

Mild loss of disc height at L4-L5 and L5-S1. Mild facet degenerative
change the left at L4-L5.

Soft tissues are unremarkable.
IMPRESSION: 1. No fracture or acute finding.
2. Lower lumbar spine degenerative changes as described. Grade 1
anterolisthesis has developed since the prior radiographs. Loss of
disc height at L4-L5 has increased from the prior study.

## 2021-08-24 ENCOUNTER — Other Ambulatory Visit (HOSPITAL_COMMUNITY): Payer: Self-pay

## 2021-08-26 ENCOUNTER — Ambulatory Visit (HOSPITAL_BASED_OUTPATIENT_CLINIC_OR_DEPARTMENT_OTHER)
Admission: RE | Admit: 2021-08-26 | Discharge: 2021-08-26 | Disposition: A | Discharge status: Home / Self Care | Payer: 59 | Source: Ambulatory Visit | Attending: Cardiology | Admitting: Cardiology

## 2021-08-26 ENCOUNTER — Other Ambulatory Visit: Payer: Self-pay

## 2021-08-26 ENCOUNTER — Ambulatory Visit (INDEPENDENT_AMBULATORY_CARE_PROVIDER_SITE_OTHER): Payer: 59 | Admitting: Sports Medicine

## 2021-08-26 ENCOUNTER — Other Ambulatory Visit (HOSPITAL_COMMUNITY): Payer: Self-pay

## 2021-08-26 VITALS — BP 97/68 | HR 69 | Wt 191.1 lb

## 2021-08-26 DIAGNOSIS — E559 Vitamin D deficiency, unspecified: Secondary | ICD-10-CM

## 2021-08-26 DIAGNOSIS — Z87898 Personal history of other specified conditions: Secondary | ICD-10-CM

## 2021-08-26 DIAGNOSIS — Q231 Congenital insufficiency of aortic valve: Secondary | ICD-10-CM | POA: Insufficient documentation

## 2021-08-26 DIAGNOSIS — Z23 Encounter for immunization: Secondary | ICD-10-CM

## 2021-08-26 DIAGNOSIS — Z6832 Body mass index (BMI) 32.0-32.9, adult: Secondary | ICD-10-CM | POA: Diagnosis not present

## 2021-08-26 DIAGNOSIS — E6609 Other obesity due to excess calories: Secondary | ICD-10-CM

## 2021-08-26 DIAGNOSIS — Z0000 Encounter for general adult medical examination without abnormal findings: Secondary | ICD-10-CM

## 2021-08-26 DIAGNOSIS — E66811 Obesity, class 1: Secondary | ICD-10-CM

## 2021-08-26 LAB — ECHOCARDIOGRAM COMPLETE
AR max vel: 1.43 cm2
AV Area VTI: 1.75 cm2
AV Area mean vel: 1.33 cm2
AV Mean grad: 5 mmHg
AV Peak grad: 8.8 mmHg
Ao pk vel: 1.48 m/s
Area-P 1/2: 3.72 cm2
S' Lateral: 2.7 cm
Weight: 3057.28 oz

## 2021-08-26 MED ORDER — SCOPOLAMINE 1 MG/3DAYS TD PT72
1.0000 | MEDICATED_PATCH | TRANSDERMAL | 11 refills | Status: DC
  Filled 2021-08-26: qty 10, 30d supply, fill #0

## 2021-08-26 NOTE — Assessment & Plan Note (Signed)
Annual physical as above, Shingrix #1 today, she does have a trip coming up to [place] and [place] for a cruise, when she comes back we can do Shingrix #2. ?Routine labs today. ?Scopolamine patches for seasickness on the cruise. ?

## 2021-08-26 NOTE — Progress Notes (Signed)
?Subjective:   ? ?CC: Annual Physical Exam ? ?HPI:  ?This patient is here for their annual physical ? ?I reviewed the past medical history, family history, social history, surgical history, and allergies today and no changes were needed.  Please see the problem list section below in epic for further details. ? ?Past Medical History: ?Past Medical History:  ?Diagnosis Date  ? Anxiety   ? Back pain   ? Bicuspid aortic valve   ? Breast cancer ([place])   ? Cholelithiasis 01/2020  ? COVID-19 07/15/2020  ? Depression   ? Hospital discharge follow-up 02/25/2020  ? Joint pain   ? Kidney cyst, acquired   ? Obesity   ? Palpitations   ? Polycythemia vera ([place])   ? PONV (postoperative nausea and vomiting)   ? Oct 31 2019 hernia with out problems  ? Right flank pain 07/25/2019  ? Thoracic aortic aneurysm   ? Thyroid nodule   ? Ventral hernia without obstruction or gangrene 09/20/2019  ? Formatting of this note might be different from the original. Added automatically from request for surgery [id number]  ? ?Past Surgical History: ?Past Surgical History:  ?Procedure Laterality Date  ? BILIARY DILATION  02/14/2020  ? Procedure: BILIARY DILATION;  Surgeon: Ronnette Juniper, MD;  Location: [facility];  Service: Gastroenterology;;  ? [place] N/A 12/28/2012  ? Procedure: DILATATION & CURETTAGE/HYSTEROSCOPY WITH THERMACHOICE ABLATION;  Surgeon: Cyril Mourning, MD;  Location: [facility];  Service: Gynecology;  Laterality: N/A;  ? ERCP N/A 02/14/2020  ? Procedure: ENDOSCOPIC RETROGRADE CHOLANGIOPANCREATOGRAPHY (ERCP);  Surgeon: Ronnette Juniper, MD;  Location: [facility];  Service: Gastroenterology;  Laterality: N/A;  ? LAPAROSCOPIC CHOLECYSTECTOMY SINGLE PORT N/A 02/13/2020  ? Procedure: LAPAROSCOPIC CHOLECYSTECTOMY, CHOLANGIOGRAM, LIVER BIOPSY;  Surgeon: Michael Boston, MD;  Location: [facility];  Service: General;  Laterality: N/A;  ? MASTECTOMY    ? bilateral  ? PARTIAL NEPHRECTOMY    ? RT   ?  SPHINCTEROTOMY  02/14/2020  ? Procedure: SPHINCTEROTOMY;  Surgeon: Ronnette Juniper, MD;  Location: [facility];  Service: Gastroenterology;;  ? TONSILLECTOMY    ? TUBAL LIGATION    ? ?Social History: ?Social History  ? ?Socioeconomic History  ? Marital status: Married  ?  Spouse name: Timmothy Sours  ? Number of children: 3  ? Years of education: Not on file  ? Highest education level: Not on file  ?Occupational History  ? Occupation: Therapist, sports  ?  Employer: [employer]  ?Tobacco Use  ? Smoking status: Never  ? Smokeless tobacco: Never  ?Vaping Use  ? Vaping Use: Never used  ?Substance and Sexual Activity  ? Alcohol use: Yes  ?  Comment: Occasional  ? Drug use: No  ? Sexual activity: Not on file  ?Other Topics Concern  ? Not on file  ?Social History Narrative  ? Not on file  ? ?Social Determinants of Health  ? ?Financial Resource Strain: Not on file  ?Food Insecurity: Not on file  ?Transportation Needs: Not on file  ?Physical Activity: Not on file  ?Stress: Not on file  ?Social Connections: Not on file  ? ?Family History: ?Family History  ?Problem Relation Age of Onset  ? Cancer Mother   ?     lung  ? Depression Mother   ? Anxiety disorder Mother   ? Bipolar disorder Mother   ? Alcoholism Mother   ? COPD Father   ? Congestive Heart Failure Father   ? Depression Father   ? Alcoholism  Father   ? Stroke Maternal Grandfather   ? ?Allergies: ?No Known Allergies ?Medications: See med rec. ? ?Review of Systems: No headache, visual changes, nausea, vomiting, diarrhea, constipation, dizziness, abdominal pain, skin rash, fevers, chills, night sweats, swollen lymph nodes, weight loss, chest pain, body aches, joint swelling, muscle aches, shortness of breath, mood changes, visual or auditory hallucinations. ? ?Objective:   ? ?General: Well Developed, well nourished, and in no acute distress.  ?Neuro: Alert and oriented x3, extra-ocular muscles intact, sensation grossly intact. Cranial nerves II through XII are intact, motor, sensory, and  coordinative functions are all intact. ?HEENT: Normocephalic, atraumatic, pupils equal round reactive to light, neck supple, no masses, no lymphadenopathy, thyroid nonpalpable. Oropharynx, nasopharynx, external ear canals are unremarkable. ?Skin: Warm and dry, no rashes noted.  ?Cardiac: Regular rate and rhythm, no murmurs rubs or gallops.  ?Respiratory: Clear to auscultation bilaterally. Not using accessory muscles, speaking in full sentences.  ?Abdominal: Soft, nontender, nondistended, positive bowel sounds, no masses, no organomegaly.  ?Musculoskeletal: Shoulder, elbow, wrist, hip, knee, ankle stable, and with full range of motion. ? ?Impression and Recommendations:   ? ?The patient was counselled, risk factors were discussed, anticipatory guidance given. ? ?Annual physical exam ?Annual physical as above, Shingrix #1 today, she does have a trip coming up to [place] and [place] for a cruise, when she comes back we can do Shingrix #2. ?Routine labs today. ?Scopolamine patches for seasickness on the cruise. ? ?History of palpitations ?Continues to have a few palpitations, PACs and PVCs with a 7 beat run of V. tach on Zio patch. ?She was sluggish on metoprolol 50 mg daily so we switched her to [facility], she has done a lot better on Bystolic but still has occasional palpitations, does not desire to change her Bystolic dose for now. ?I have challenged her to get 30 minutes of moderate intensity aerobic exercise which will increase vagal tone and further decrease her palpitations. ? ? ?___________________________________________ ?[name]. Dianah Field, M.D., ABFM., CAQSM. ?Primary Care and Sports Medicine ?[place] ? ?Adjunct Professor of Family Medicine  ?[employer] ?

## 2021-08-26 NOTE — Addendum Note (Signed)
Addended by: Gust Brooms on: 08/26/2021 09:45 AM ? ? Modules accepted: Orders ? ?

## 2021-08-26 NOTE — Progress Notes (Signed)
?  Echocardiogram ?2D Echocardiogram has been performed. ? ?Elmer Ramp ?08/26/2021, 1:12 PM ?

## 2021-08-26 NOTE — Assessment & Plan Note (Signed)
Continues to have a few palpitations, PACs and PVCs with a 7 beat run of V. tach on Zio patch. ?She was sluggish on metoprolol 50 mg daily so we switched her to [facility], she has done a lot better on Bystolic but still has occasional palpitations, does not desire to change her Bystolic dose for now. ?I have challenged her to get 30 minutes of moderate intensity aerobic exercise which will increase vagal tone and further decrease her palpitations. ?

## 2021-08-27 ENCOUNTER — Other Ambulatory Visit (HOSPITAL_COMMUNITY): Payer: Self-pay

## 2021-08-27 LAB — CBC
HCT: 43 % (ref 35.0–45.0)
Hemoglobin: 13.8 g/dL (ref 11.7–15.5)
MCH: 28.2 pg (ref 27.0–33.0)
MCHC: 32.1 g/dL (ref 32.0–36.0)
MCV: 87.8 fL (ref 80.0–100.0)
MPV: 10 fL (ref 7.5–12.5)
Platelets: 174 10*3/uL (ref 140–400)
RBC: 4.9 10*6/uL (ref 3.80–5.10)
RDW: 13.1 % (ref 11.0–15.0)
WBC: 6.9 10*3/uL (ref 3.8–10.8)

## 2021-08-27 LAB — LIPID PANEL
Cholesterol: 238 mg/dL — ABNORMAL HIGH (ref <200)
HDL: 94 mg/dL (ref >=50)
LDL Cholesterol (Calc): 118 mg/dL (calc) — ABNORMAL HIGH
Non-HDL Cholesterol (Calc): 144 mg/dL (calc) — ABNORMAL HIGH (ref <130)
Total CHOL/HDL Ratio: 2.5 (calc) (ref <5.0)
Triglycerides: 138 mg/dL (ref <150)

## 2021-08-27 LAB — HEMOGLOBIN A1C
Hgb A1c MFr Bld: 5.1 % of total Hgb (ref <5.7)
Mean Plasma Glucose: 100 mg/dL
eAG (mmol/L): 5.5 mmol/L

## 2021-08-27 LAB — COMPREHENSIVE METABOLIC PANEL
AG Ratio: 1.7 (calc) (ref 1.0–2.5)
ALT: 12 U/L (ref 6–29)
AST: 13 U/L (ref 10–35)
Albumin: 4.3 g/dL (ref 3.6–5.1)
Alkaline phosphatase (APISO): 90 U/L (ref 37–153)
BUN: 14 mg/dL (ref 7–25)
CO2: 30 mmol/L (ref 20–32)
Calcium: 9.7 mg/dL (ref 8.6–10.4)
Chloride: 105 mmol/L (ref 98–110)
Creat: 0.78 mg/dL (ref 0.50–1.03)
Globulin: 2.6 g/dL (calc) (ref 1.9–3.7)
Glucose, Bld: 90 mg/dL (ref 65–99)
Potassium: 5 mmol/L (ref 3.5–5.3)
Sodium: 141 mmol/L (ref 135–146)
Total Bilirubin: 0.5 mg/dL (ref 0.2–1.2)
Total Protein: 6.9 g/dL (ref 6.1–8.1)

## 2021-08-27 LAB — TSH: TSH: 1.73 mIU/L

## 2021-08-27 LAB — VITAMIN D 25 HYDROXY (VIT D DEFICIENCY, FRACTURES): Vit D, 25-Hydroxy: 18 ng/mL — ABNORMAL LOW (ref 30–100)

## 2021-08-27 MED ORDER — VITAMIN D (ERGOCALCIFEROL) 1.25 MG (50000 UNIT) PO CAPS
50000.0000 [IU] | ORAL_CAPSULE | ORAL | 0 refills | Status: DC
  Filled 2021-08-27: qty 8, 56d supply, fill #0

## 2021-08-27 NOTE — Addendum Note (Signed)
Addended by: Silverio Decamp on: 08/27/2021 09:24 AM ? ? Modules accepted: Orders ? ?

## 2021-09-08 ENCOUNTER — Other Ambulatory Visit (HOSPITAL_COMMUNITY): Payer: Self-pay

## 2021-09-09 ENCOUNTER — Telehealth: Payer: Self-pay

## 2021-09-09 NOTE — Telephone Encounter (Signed)
Initiated Prior authorization VDI:XVEZBM 2.4MG/0.75ML auto-injectors ?Via: Covermymeds ?Case/Key: [id number] ?Status: approved  as of 09/09/2021 ?Reason:effective for a maximum of 13 fills from 09/09/2021 to 09/09/2022 ?Notified Pt via: Mychart ?

## 2021-09-21 ENCOUNTER — Other Ambulatory Visit: Payer: Self-pay | Admitting: Medical-Surgical

## 2021-09-21 ENCOUNTER — Other Ambulatory Visit (HOSPITAL_COMMUNITY): Payer: Self-pay

## 2021-09-22 ENCOUNTER — Other Ambulatory Visit (HOSPITAL_COMMUNITY): Payer: Self-pay

## 2021-09-22 ENCOUNTER — Other Ambulatory Visit: Payer: Self-pay | Admitting: Medical-Surgical

## 2021-09-22 MED ORDER — HYDROXYZINE HCL 50 MG PO TABS
50.0000 mg | ORAL_TABLET | Freq: Every evening | ORAL | 0 refills | Status: DC | PRN
Start: 2021-09-22 — End: 2021-12-09
  Filled 2021-09-22: qty 30, 15d supply, fill #0

## 2021-10-19 ENCOUNTER — Other Ambulatory Visit (HOSPITAL_COMMUNITY): Payer: Self-pay

## 2021-11-14 ENCOUNTER — Other Ambulatory Visit: Payer: Self-pay | Admitting: Sports Medicine

## 2021-11-14 DIAGNOSIS — Z87898 Personal history of other specified conditions: Secondary | ICD-10-CM

## 2021-11-16 ENCOUNTER — Other Ambulatory Visit (HOSPITAL_COMMUNITY): Payer: Self-pay

## 2021-11-16 MED ORDER — NEBIVOLOL HCL 10 MG PO TABS
10.0000 mg | ORAL_TABLET | Freq: Every day | ORAL | 3 refills | Status: DC
  Filled 2021-11-16 – 2021-12-09 (×2): qty 90, 90d supply, fill #0
  Filled 2022-03-05: qty 90, 90d supply, fill #1
  Filled 2022-06-14 – 2022-06-15 (×2): qty 90, 90d supply, fill #2

## 2021-12-09 ENCOUNTER — Other Ambulatory Visit: Payer: Self-pay | Admitting: Sports Medicine

## 2021-12-09 ENCOUNTER — Other Ambulatory Visit (HOSPITAL_COMMUNITY): Payer: Self-pay

## 2021-12-09 DIAGNOSIS — F410 Panic disorder [episodic paroxysmal anxiety] without agoraphobia: Secondary | ICD-10-CM

## 2021-12-09 MED ORDER — LORAZEPAM 0.5 MG PO TABS
0.5000 mg | ORAL_TABLET | Freq: Two times a day (BID) | ORAL | 0 refills | Status: DC | PRN
  Filled 2021-12-09: qty 30, 15d supply, fill #0

## 2021-12-09 MED ORDER — HYDROXYZINE HCL 50 MG PO TABS
50.0000 mg | ORAL_TABLET | Freq: Every evening | ORAL | 0 refills | Status: DC | PRN
  Filled 2021-12-09: qty 30, 15d supply, fill #0

## 2021-12-10 ENCOUNTER — Other Ambulatory Visit (HOSPITAL_COMMUNITY): Payer: Self-pay

## 2022-01-07 ENCOUNTER — Other Ambulatory Visit: Payer: Self-pay | Admitting: Sports Medicine

## 2022-01-11 ENCOUNTER — Other Ambulatory Visit (HOSPITAL_COMMUNITY): Payer: Self-pay

## 2022-01-11 MED ORDER — TRIAMCINOLONE ACETONIDE 0.1 % EX CREA
1.0000 | TOPICAL_CREAM | Freq: Two times a day (BID) | CUTANEOUS | 4 refills | Status: DC
  Filled 2022-01-11: qty 45, 23d supply, fill #0
  Filled 2022-03-05: qty 45, 23d supply, fill #1
  Filled 2022-04-06: qty 45, 23d supply, fill #2

## 2022-01-21 ENCOUNTER — Other Ambulatory Visit (HOSPITAL_COMMUNITY): Payer: Self-pay

## 2022-02-20 IMAGING — RF DG CHOLANGIOGRAM OPERATIVE
1 series · 4 of 4 positions shown · non-contrast
Comparison: Ultrasound 02/09/2020

CLINICAL DATA: 48-year-old with cholelithiasis.

EXAM:
INTRAOPERATIVE CHOLANGIOGRAM
TECHNIQUE: Cholangiographic images from the C-arm fluoroscopic device were
submitted for interpretation post-operatively. Please see the
procedural report for the amount of contrast and the fluoroscopy
time utilized.

[Series 1: run · 4 of 90 frames shown]
[frame 14/90]
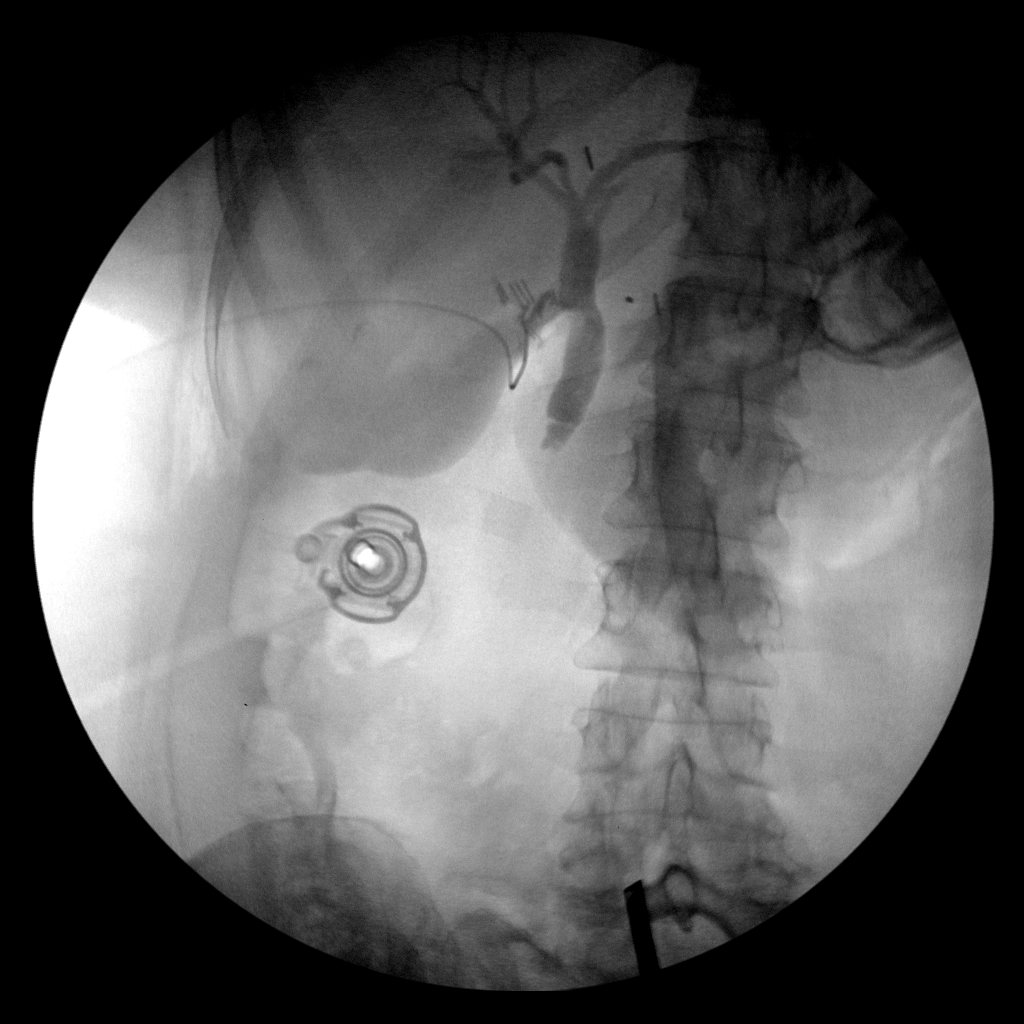
[frame 46/90]
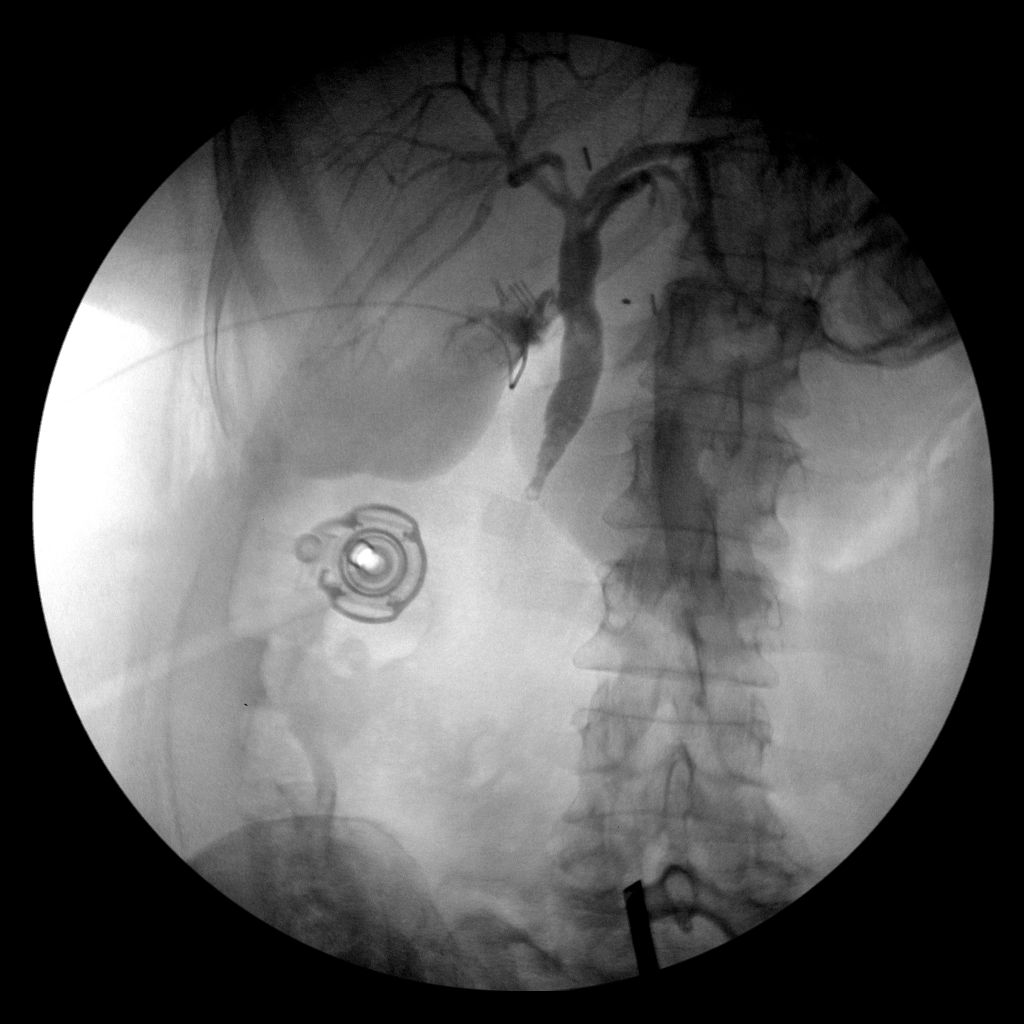
[frame 50/90]
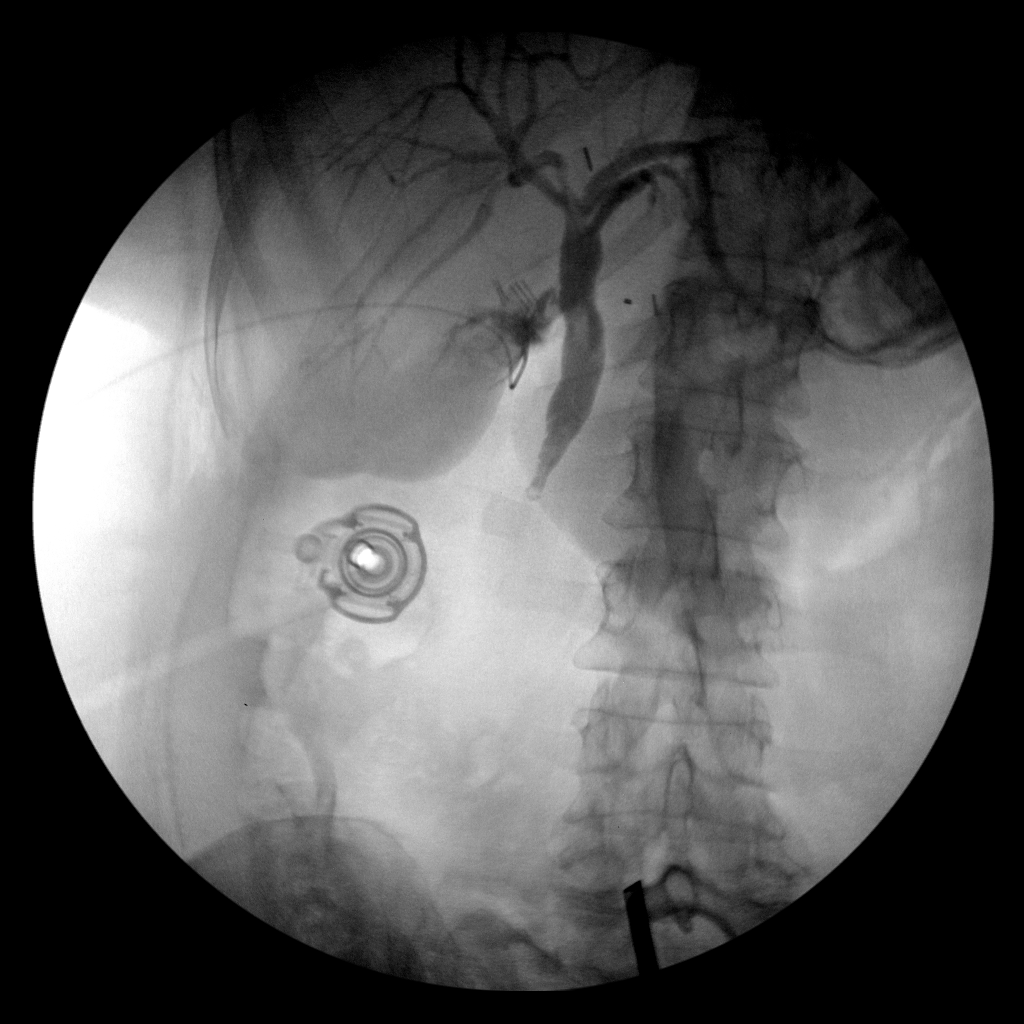
[frame 77/90]
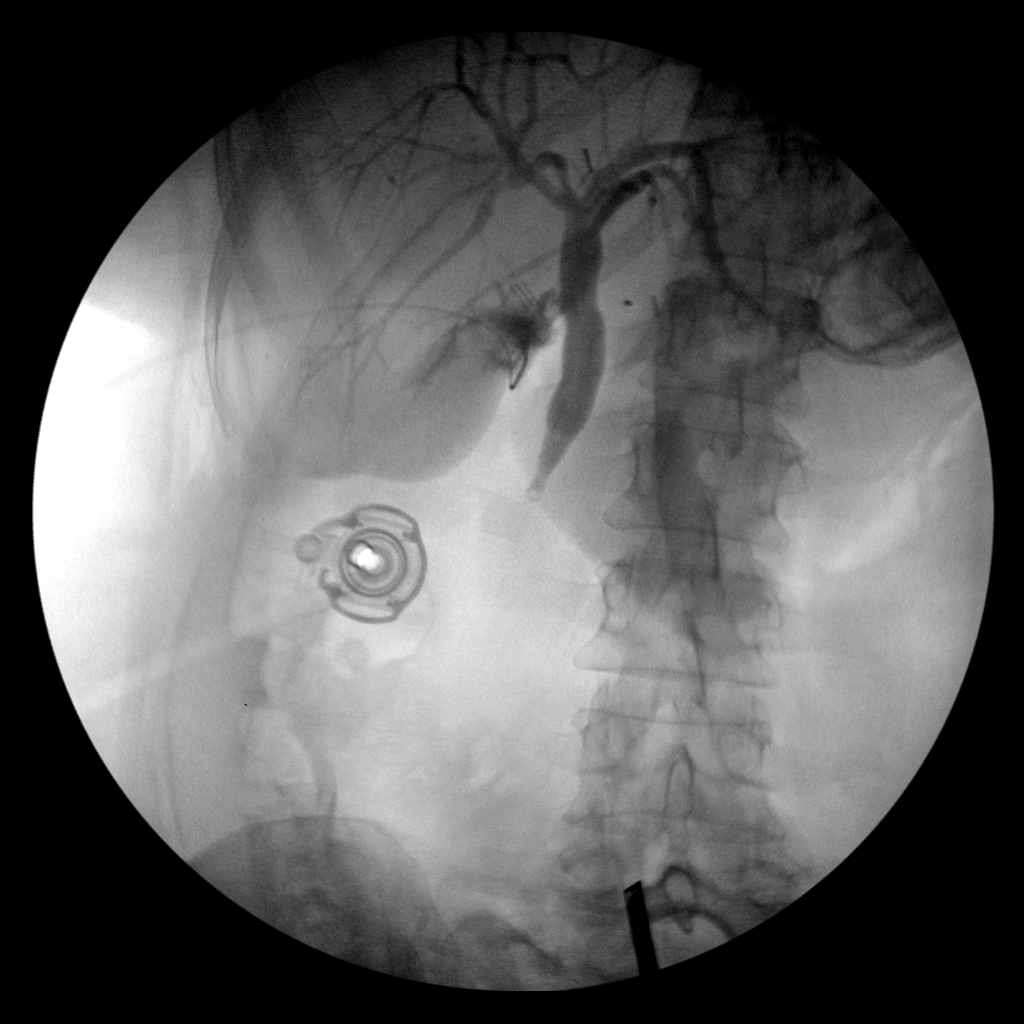

[4 of 4 positions shown; findings below may reference images not displayed]

FINDINGS: Contrast opacification of the common bile duct and intrahepatic bile
ducts. Mild dilatation of the extrahepatic biliary system. There is
no drainage into the duodenum. Mobile filling defects in the biliary
system likely represent gas. A small persistent filling defect in
the distal common bile duct may represent a small stone.
IMPRESSION: Obstruction of the distal common bile duct, likely from a stone.

## 2022-02-21 IMAGING — RF DG ERCP WO/W SPHINCTEROTOMY
1 series · 15 of 24 positions shown · non-contrast
Comparison: Intra op cholangiogram from previous day

CLINICAL DATA: Cholelithiasis, post cholecystectomy with CBD
obstruction

EXAM:
ERCP
TECHNIQUE: Multiple spot images obtained with the fluoroscopic device and
submitted for interpretation post-procedure.

[Series 1: run · 15 of 43 slices shown]
[im 1/43]
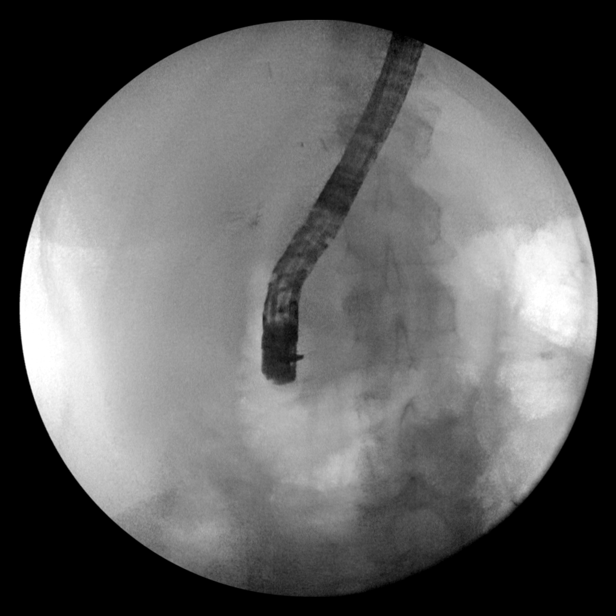
[im 4/43]
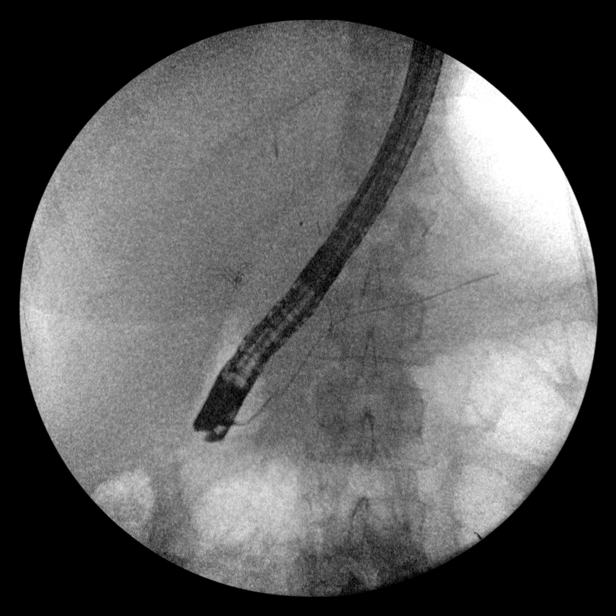
[im 8/43]
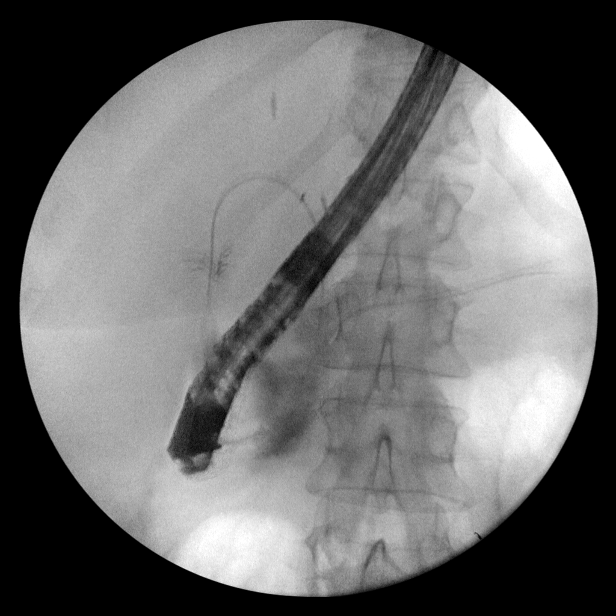
[im 10/43]
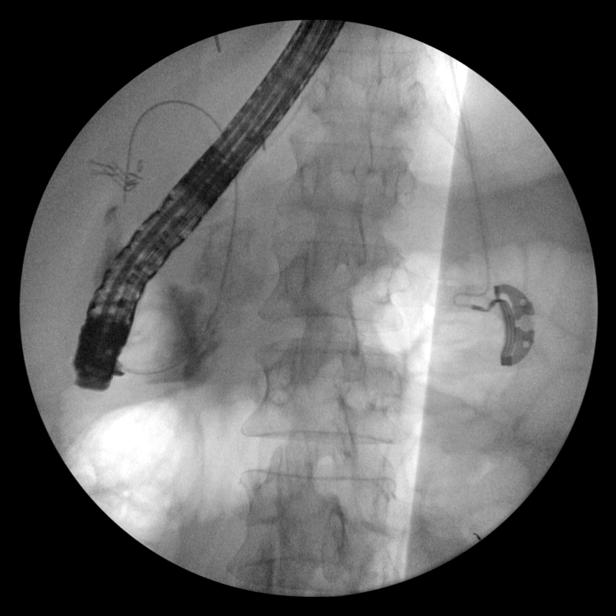
[im 13/43]
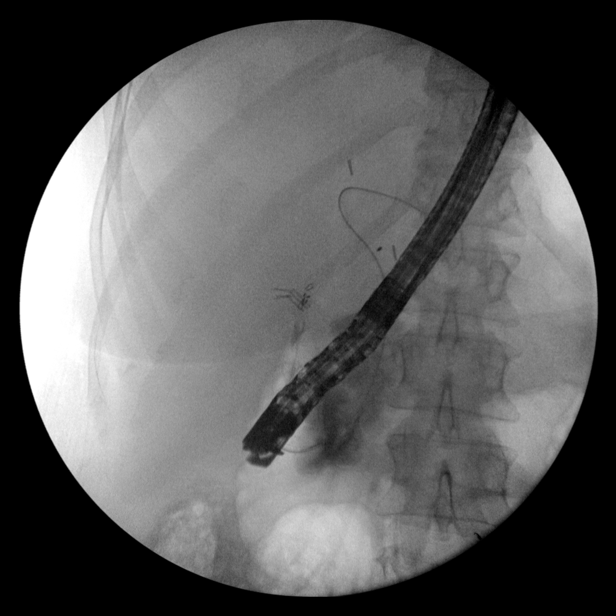
[im 15/43]
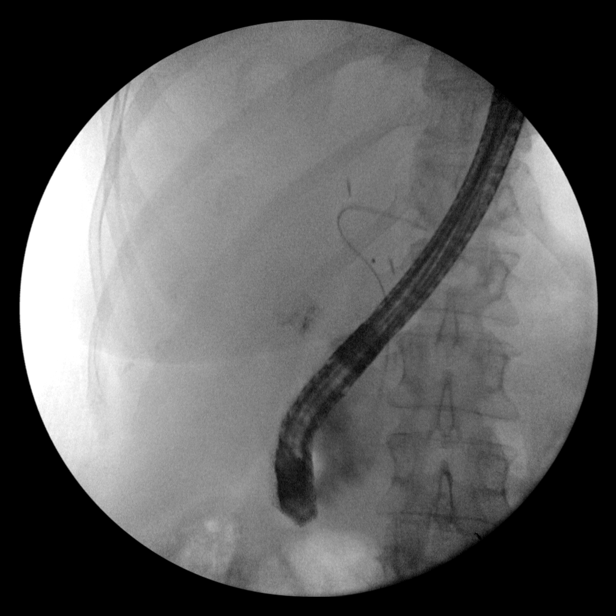
[im 19/43]
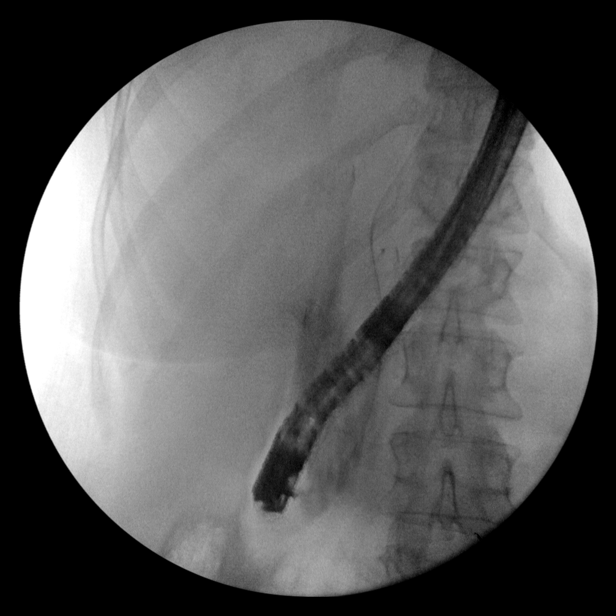
[im 22/43]
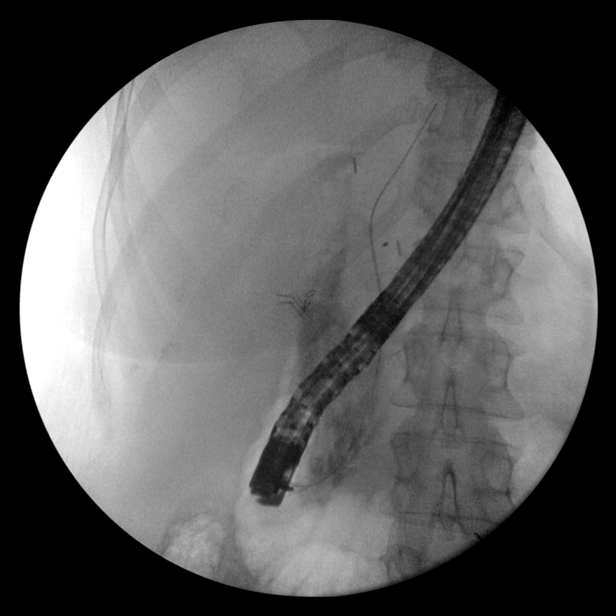
[im 24/43]
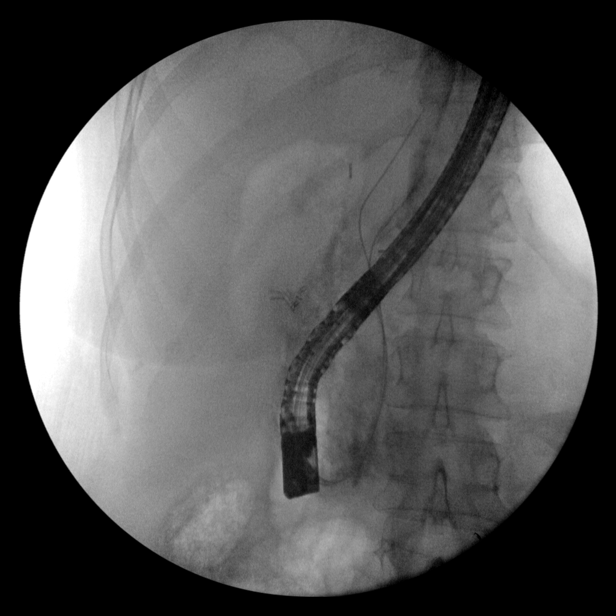
[im 28/43]
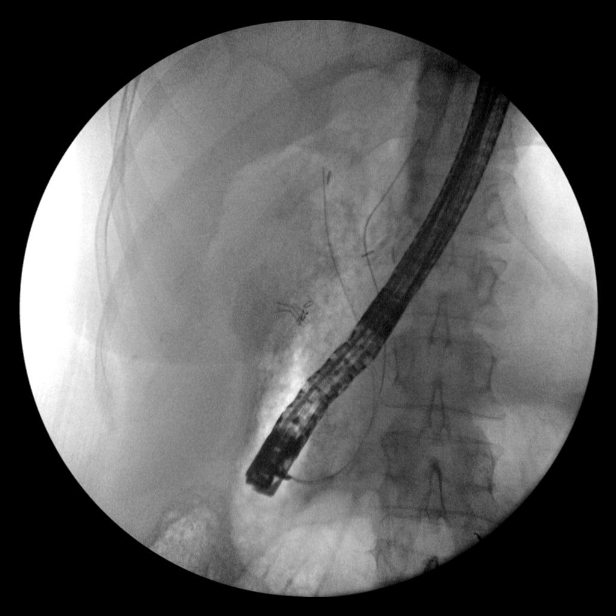
[im 30/43]
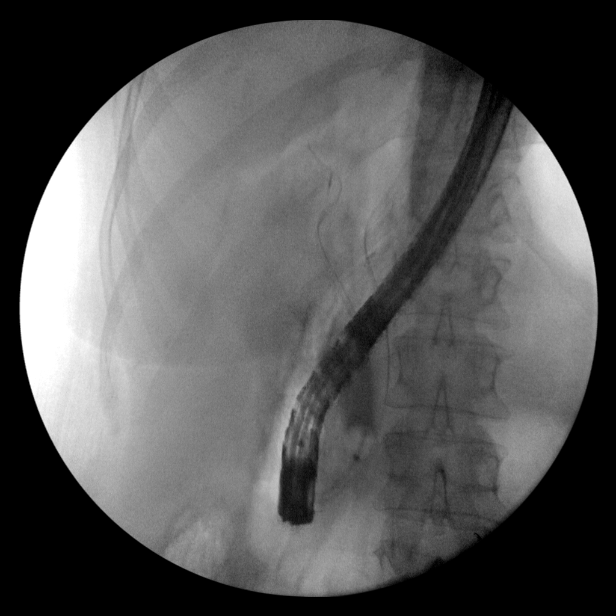
[im 33/43]
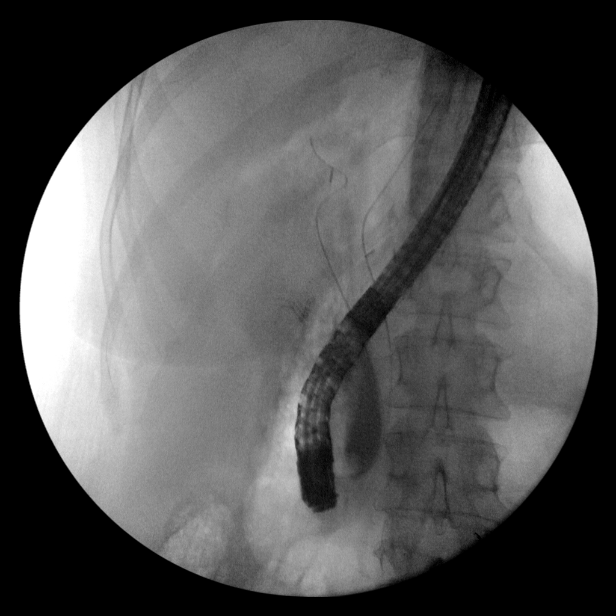
[im 37/43]
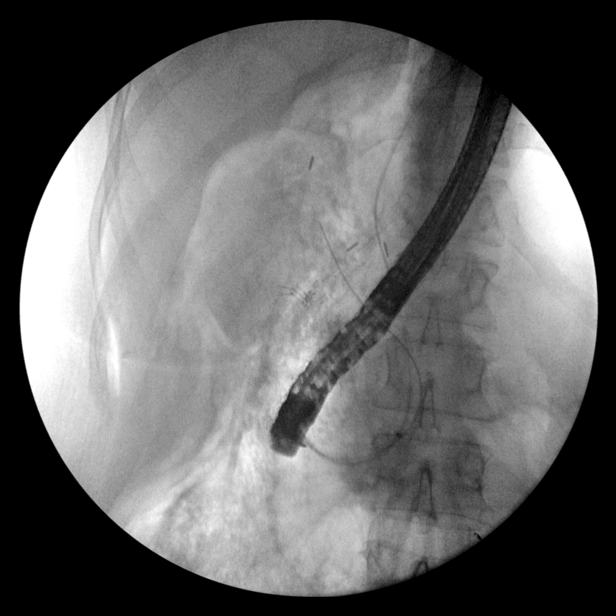
[im 39/43]
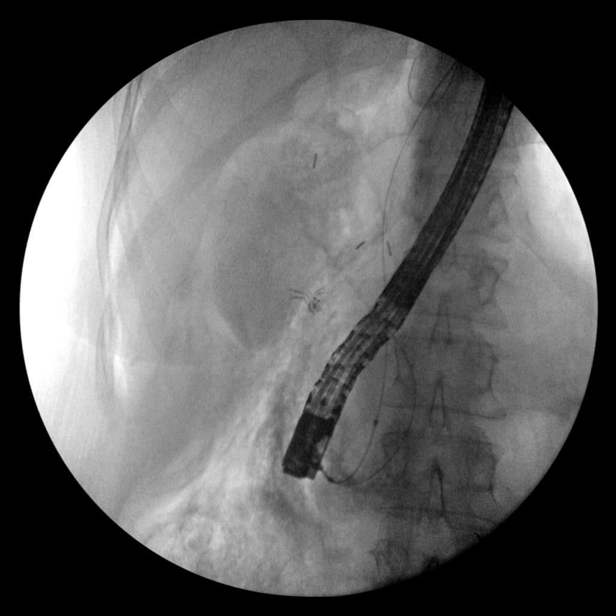
[im 43/43]
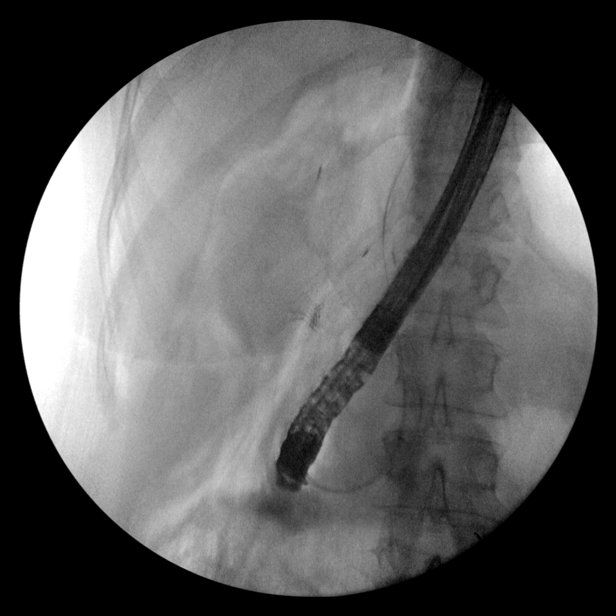

[15 of 24 positions shown; findings below may reference images not displayed]

FINDINGS: Series of fluoroscopic spot images document endoscopic cannulation
of the pancreatic duct and CBD with guidewires, subsequent partial
opacification of the CBD and passage of balloon catheters through
the distal CBD. No significant opacification of the intrahepatic
biliary tree.
IMPRESSION: Endoscopic CBD cannulation and intervention as above

These images were submitted for radiologic interpretation only.
Please see the procedural report for the amount of contrast and the
fluoroscopy time utilized.

## 2022-02-24 ENCOUNTER — Other Ambulatory Visit: Payer: Self-pay | Admitting: Sports Medicine

## 2022-02-24 ENCOUNTER — Other Ambulatory Visit: Payer: Self-pay | Admitting: Medical-Surgical

## 2022-02-24 ENCOUNTER — Other Ambulatory Visit (HOSPITAL_COMMUNITY): Payer: Self-pay

## 2022-02-24 ENCOUNTER — Ambulatory Visit (INDEPENDENT_AMBULATORY_CARE_PROVIDER_SITE_OTHER): Payer: 59 | Admitting: Sports Medicine

## 2022-02-24 VITALS — BP 136/78 | HR 64

## 2022-02-24 DIAGNOSIS — Z23 Encounter for immunization: Secondary | ICD-10-CM | POA: Diagnosis not present

## 2022-02-24 MED ORDER — HYDROXYZINE HCL 50 MG PO TABS
50.0000 mg | ORAL_TABLET | Freq: Every evening | ORAL | 0 refills | Status: DC | PRN
  Filled 2022-02-24: qty 30, 15d supply, fill #0

## 2022-02-24 NOTE — Progress Notes (Signed)
Pt here for shingles vaccine #2.  Screening questionnaire reviewed, VIS provided to patient, and any/all patient questions answered.  Charyl Bigger, CMA

## 2022-02-25 ENCOUNTER — Other Ambulatory Visit (HOSPITAL_COMMUNITY): Payer: Self-pay

## 2022-03-02 ENCOUNTER — Other Ambulatory Visit (HOSPITAL_COMMUNITY): Payer: Self-pay

## 2022-03-02 ENCOUNTER — Other Ambulatory Visit: Payer: Self-pay | Admitting: Medical-Surgical

## 2022-03-03 ENCOUNTER — Encounter: Payer: Self-pay | Admitting: Medical-Surgical

## 2022-03-04 IMAGING — US US EXTREM  UP VENOUS*R*
1 series · 13 of 24 positions shown · non-contrast
Comparison: None.

CLINICAL DATA: Right upper extremity edema. Recent upper extremity
IV. Evaluate for DVT.



[Series 1: us extrem up venous*right* · 0.08mm/px · 13 of 44 slices shown]
[im 1/44]
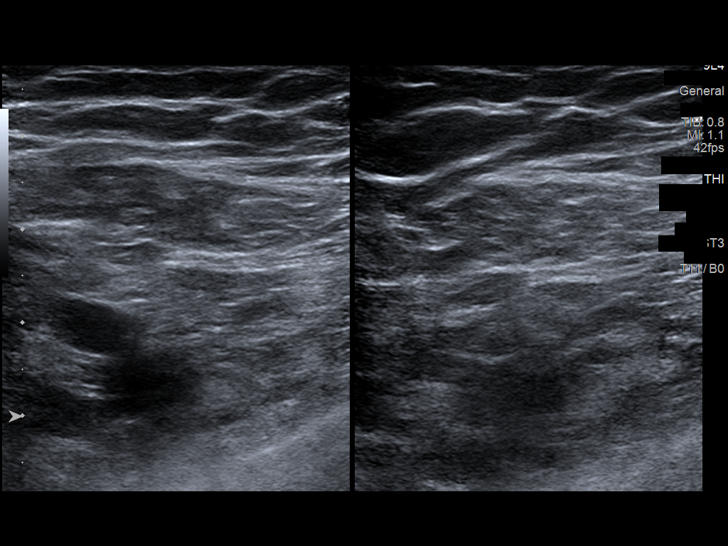
[im 4/44]
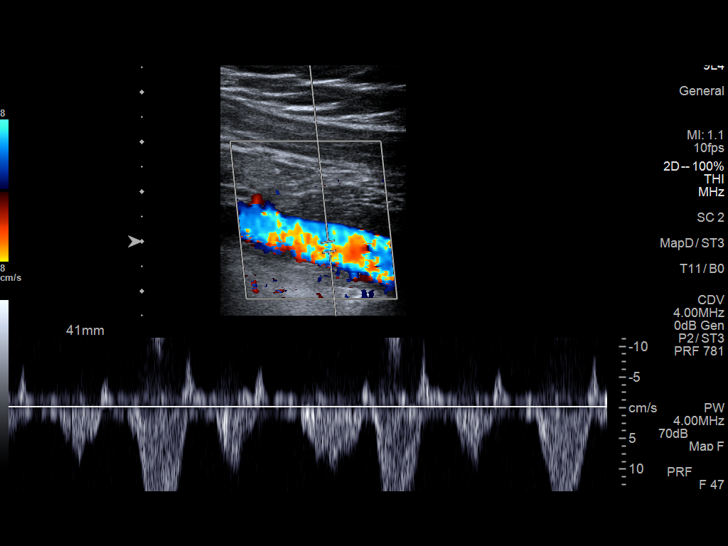
[im 8/44]
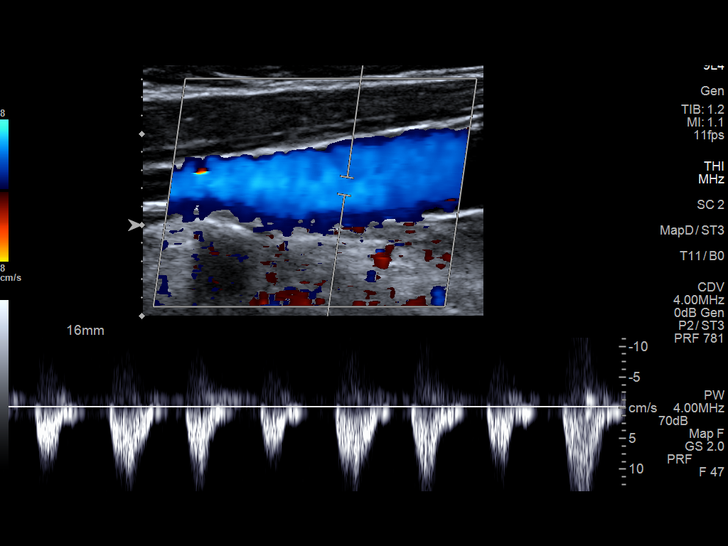
[im 12/44]
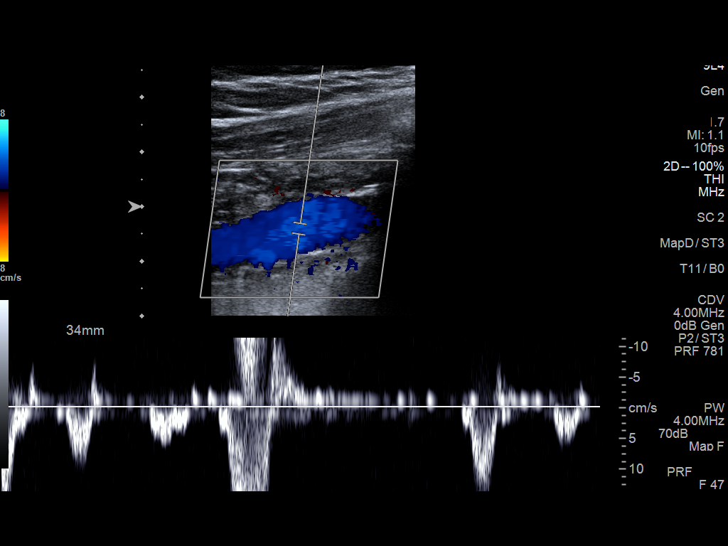
[im 15/44]
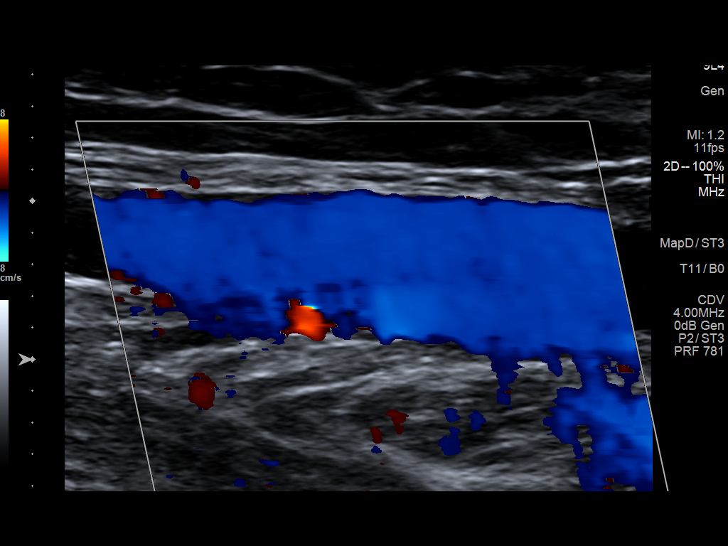
[im 19/44]
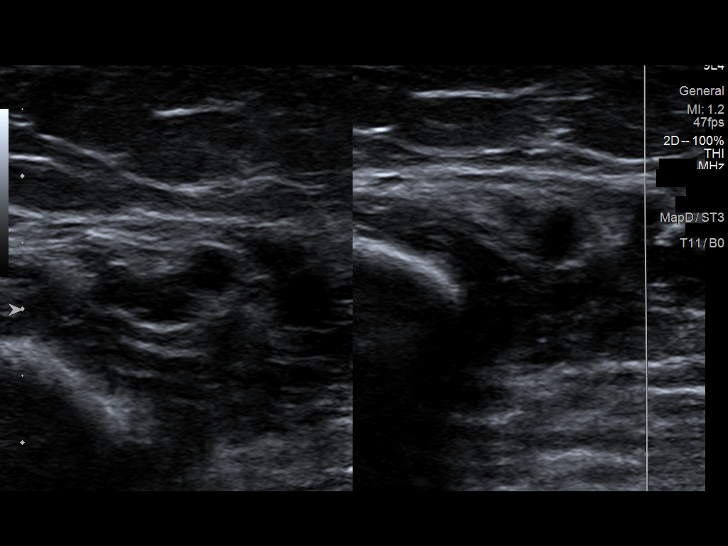
[im 23/44]
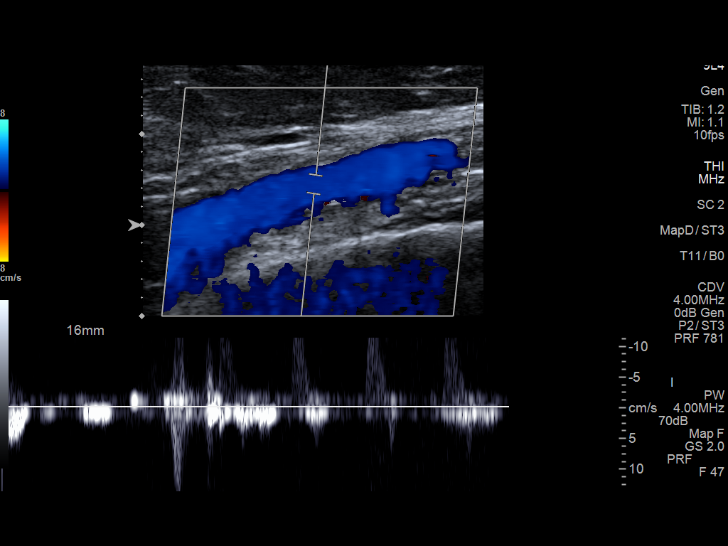
[im 25/44]
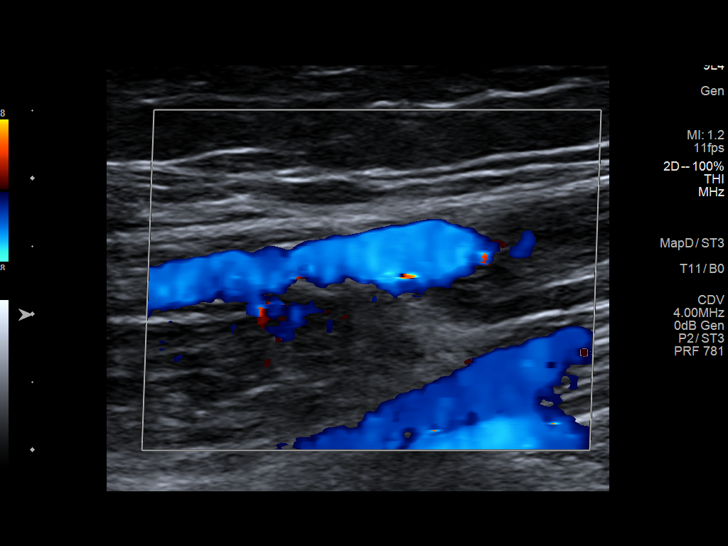
[im 29/44]
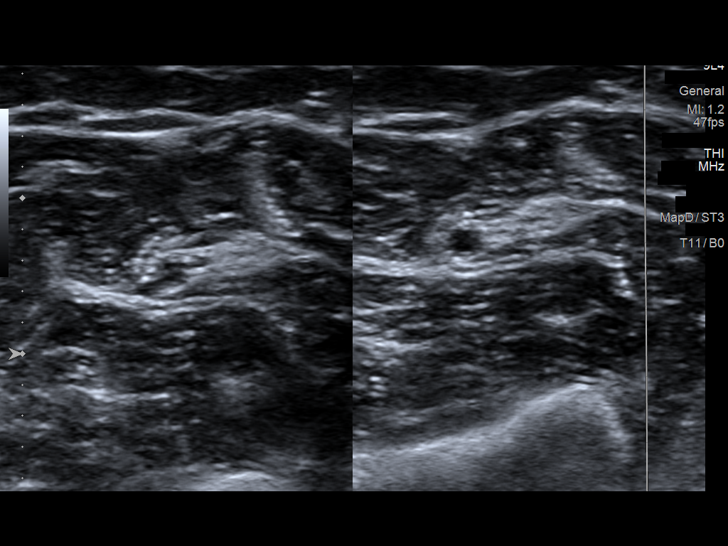
[im 32/44]
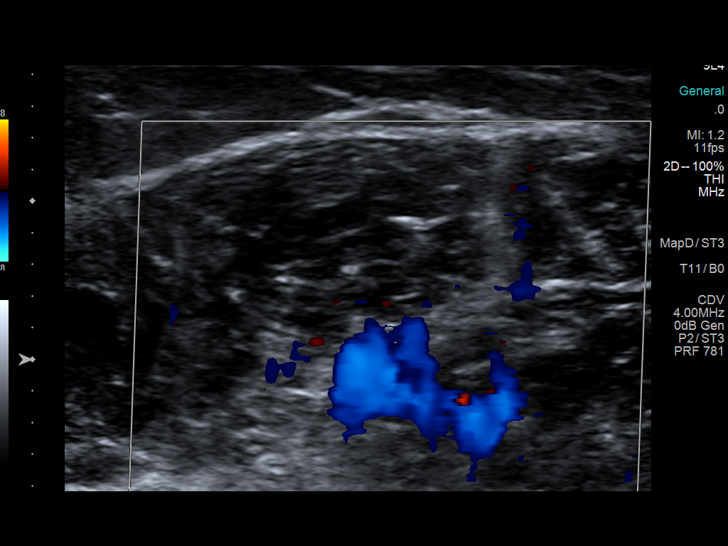
[im 36/44]
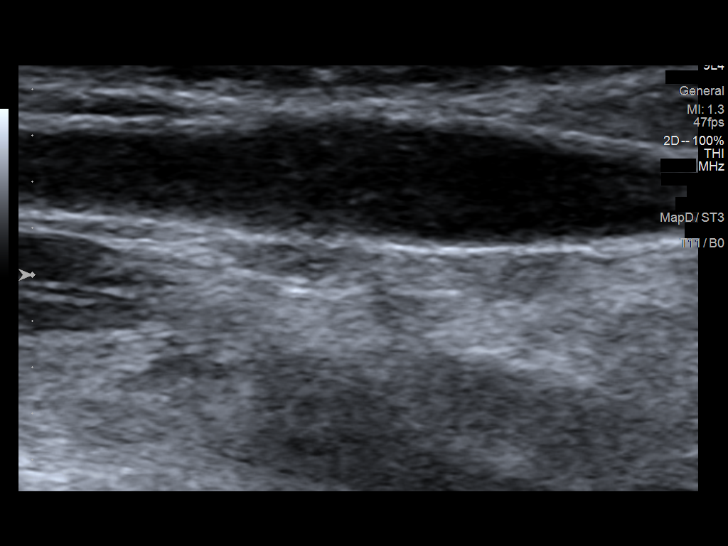
[im 40/44]
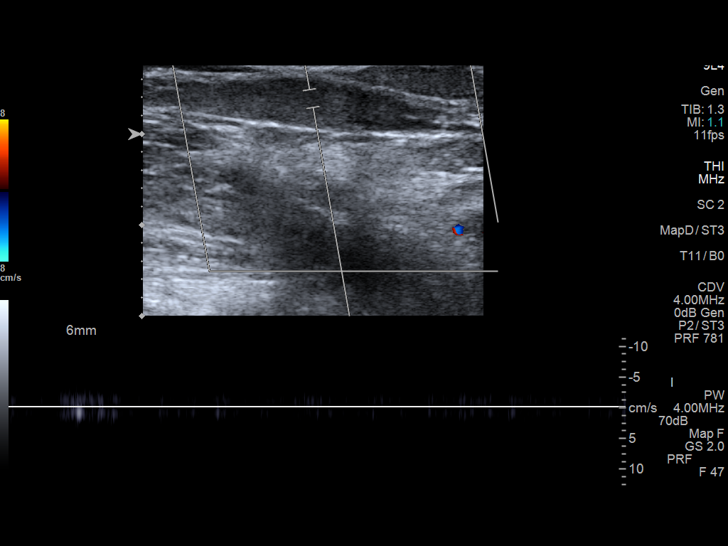
[im 44/44]
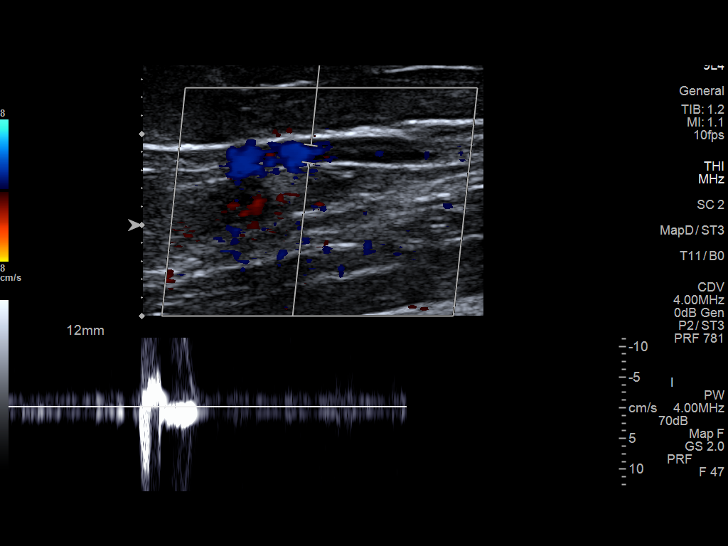

[13 of 24 positions shown; findings below may reference images not displayed]

FINDINGS: Contralateral Subclavian Vein: Respiratory phasicity is normal and
symmetric with the symptomatic side. No evidence of thrombus. Normal
compressibility.

Internal Jugular Vein: No evidence of thrombus. Normal
compressibility, respiratory phasicity and response to augmentation.

Subclavian Vein: No evidence of thrombus. Normal compressibility,
respiratory phasicity and response to augmentation.

Axillary Vein: No evidence of thrombus. Normal compressibility,
respiratory phasicity and response to augmentation.

Cephalic Vein: There is hypoechoic nonocclusive thrombus within the
right cephalic vein at the level of the antecubital fossa (images 36
- 44).

Basilic Vein: No evidence of thrombus. Normal compressibility,
respiratory phasicity and response to augmentation.

Brachial Veins: No evidence of thrombus. Normal compressibility,
respiratory phasicity and response to augmentation.

Radial Veins: No evidence of thrombus. Normal compressibility,
respiratory phasicity and response to augmentation.

Ulnar Veins: No evidence of thrombus. Normal compressibility,
respiratory phasicity and response to augmentation.

Venous Reflux:  None visualized.

Other Findings:  None visualized.
IMPRESSION: 1. No evidence of DVT within the right upper extremity.
2. The examination is positive for nonocclusive superficial
thrombophlebitis involving the right cephalic vein at the level of
the antecubital fossa, nonspecific though potentially the sequela of
provided history of recent peripheral intravenous access. Again,
there is no extension of this nonocclusive SVT to the deep venous
system of the right upper extremity.

## 2022-03-05 ENCOUNTER — Other Ambulatory Visit (HOSPITAL_COMMUNITY): Payer: Self-pay

## 2022-03-17 ENCOUNTER — Encounter: Payer: Self-pay | Admitting: Sports Medicine

## 2022-03-17 ENCOUNTER — Ambulatory Visit: Payer: 59 | Admitting: Sports Medicine

## 2022-03-17 ENCOUNTER — Other Ambulatory Visit (HOSPITAL_COMMUNITY): Payer: Self-pay

## 2022-03-17 DIAGNOSIS — E6609 Other obesity due to excess calories: Secondary | ICD-10-CM | POA: Diagnosis not present

## 2022-03-17 DIAGNOSIS — Z6832 Body mass index (BMI) 32.0-32.9, adult: Secondary | ICD-10-CM

## 2022-03-17 DIAGNOSIS — E66811 Obesity, class 1: Secondary | ICD-10-CM

## 2022-03-17 MED ORDER — MOUNJARO 2.5 MG/0.5ML ~~LOC~~ SOAJ
2.5000 mg | SUBCUTANEOUS | 0 refills | Status: DC
  Filled 2022-03-17 – 2022-04-23 (×4): qty 2, 28d supply, fill #0

## 2022-03-17 MED ORDER — SEMAGLUTIDE-WEIGHT MANAGEMENT 2.4 MG/0.75ML ~~LOC~~ SOAJ
2.4000 mg | SUBCUTANEOUS | 11 refills | Status: DC
  Filled 2022-03-17: qty 3, 28d supply, fill #0

## 2022-03-17 NOTE — Assessment & Plan Note (Addendum)
Pleasant 51 year old female, unfortunately she has not lost significant weight on Wegovy. I will refill it for another month but I would like to switch to a different GLP-1. She tried and failed Contrave. We will try Mounjaro. For insurance coverage purposes she is not taking any GLP-1's. If we are unable to get [facility] approved we will try Rybelsus with a prescription and then potentially samples, if unable to get this approved we will try referral to bariatric surgery.

## 2022-03-17 NOTE — Progress Notes (Signed)
    Procedures performed today:    None.  Independent interpretation of notes and tests performed by another provider:   None.  Brief History, Exam, Impression, and Recommendations:    Class 1 obesity with serious comorbidity and body mass index (BMI) of 32.0 to 32.9 in adult Pleasant 51 year old female, unfortunately she has not lost significant weight on Wegovy. I will refill it for another month but I would like to switch to a different GLP-1. She tried and failed Contrave. We will try Mounjaro. For insurance coverage purposes she is not taking any GLP-1's. If we are unable to get [facility] approved we will try Rybelsus with a prescription and then potentially samples, if unable to get this approved we will try referral to bariatric surgery.    ____________________________________________ [name]. Dianah Field, M.D., ABFM., CAQSM., AME. Primary Care and Sports Medicine [facility]  Adjunct Professor of [place] [employer] [facility] [employer]  Risk manager

## 2022-03-18 ENCOUNTER — Encounter: Payer: Self-pay | Admitting: Sports Medicine

## 2022-03-18 ENCOUNTER — Other Ambulatory Visit: Payer: Self-pay | Admitting: Medical-Surgical

## 2022-03-18 ENCOUNTER — Other Ambulatory Visit (HOSPITAL_COMMUNITY): Payer: Self-pay

## 2022-03-18 MED ORDER — WEGOVY 2.4 MG/0.75ML ~~LOC~~ SOAJ
2.4000 mg | SUBCUTANEOUS | 11 refills | Status: DC
  Filled 2022-03-18: qty 3, 28d supply, fill #0
  Filled 2022-04-06: qty 3, 28d supply, fill #1

## 2022-03-19 ENCOUNTER — Other Ambulatory Visit (HOSPITAL_COMMUNITY): Payer: Self-pay

## 2022-03-22 ENCOUNTER — Other Ambulatory Visit (HOSPITAL_COMMUNITY): Payer: Self-pay

## 2022-04-06 ENCOUNTER — Other Ambulatory Visit (HOSPITAL_COMMUNITY): Payer: Self-pay

## 2022-04-06 ENCOUNTER — Other Ambulatory Visit: Payer: Self-pay | Admitting: Sports Medicine

## 2022-04-06 DIAGNOSIS — F430 Acute stress reaction: Secondary | ICD-10-CM

## 2022-04-06 MED ORDER — LORAZEPAM 0.5 MG PO TABS
0.5000 mg | ORAL_TABLET | Freq: Two times a day (BID) | ORAL | 0 refills | Status: DC | PRN
  Filled 2022-04-06: qty 30, 15d supply, fill #0

## 2022-04-06 MED ORDER — HYDROXYZINE HCL 50 MG PO TABS
50.0000 mg | ORAL_TABLET | Freq: Every evening | ORAL | 0 refills | Status: DC | PRN
  Filled 2022-04-06: qty 4, 2d supply, fill #0
  Filled 2022-04-06: qty 30, 15d supply, fill #0
  Filled 2022-04-06: qty 26, 13d supply, fill #0

## 2022-04-10 DIAGNOSIS — J029 Acute pharyngitis, unspecified: Secondary | ICD-10-CM | POA: Diagnosis not present

## 2022-04-13 ENCOUNTER — Other Ambulatory Visit (HOSPITAL_COMMUNITY): Payer: Self-pay

## 2022-04-15 ENCOUNTER — Encounter: Payer: Self-pay | Admitting: Sports Medicine

## 2022-04-15 ENCOUNTER — Telehealth: Payer: Self-pay

## 2022-04-15 NOTE — Telephone Encounter (Addendum)
Initiated Prior authorization ADG:NPHQNE 2.4MG/0.75ML auto-injectors Via: Covermymeds Case/Key:[id number] Status: approved as of 04/15/22 Reason:Member has an active PA on file which is expiring on 09/09/2022 and has 7 no. of fills remaining. No pa is required at this time Notified Pt via: Mychart   Initiated Prior authorization TUY:[id number] 2.5MG/0.5ML pen-injectors Via: Covermymeds Case/Key:[id number]  Status: approved  as of 04/15/22  Reason:the authorization is effective from 04/17/2022 to 04/17/2023 Notified Pt via: Mychart

## 2022-04-23 ENCOUNTER — Other Ambulatory Visit (HOSPITAL_COMMUNITY): Payer: Self-pay

## 2022-05-24 ENCOUNTER — Other Ambulatory Visit: Payer: Self-pay | Admitting: Sports Medicine

## 2022-05-24 DIAGNOSIS — E6609 Other obesity due to excess calories: Secondary | ICD-10-CM

## 2022-05-25 ENCOUNTER — Other Ambulatory Visit (HOSPITAL_COMMUNITY): Payer: Self-pay

## 2022-05-25 MED ORDER — HYDROXYZINE HCL 50 MG PO TABS
50.0000 mg | ORAL_TABLET | Freq: Every evening | ORAL | 0 refills | Status: DC | PRN
  Filled 2022-05-25: qty 30, 15d supply, fill #0

## 2022-05-25 MED ORDER — MOUNJARO 2.5 MG/0.5ML ~~LOC~~ SOAJ
2.5000 mg | SUBCUTANEOUS | 0 refills | Status: DC
  Filled 2022-05-25: qty 2, 28d supply, fill #0

## 2022-05-31 ENCOUNTER — Other Ambulatory Visit: Payer: Self-pay | Admitting: Nurse Practitioner

## 2022-05-31 DIAGNOSIS — R1013 Epigastric pain: Secondary | ICD-10-CM

## 2022-06-03 ENCOUNTER — Other Ambulatory Visit (HOSPITAL_COMMUNITY): Payer: Self-pay

## 2022-06-10 DIAGNOSIS — H524 Presbyopia: Secondary | ICD-10-CM | POA: Diagnosis not present

## 2022-06-15 ENCOUNTER — Other Ambulatory Visit (HOSPITAL_COMMUNITY): Payer: Self-pay

## 2022-06-16 ENCOUNTER — Other Ambulatory Visit (HOSPITAL_COMMUNITY): Payer: Self-pay

## 2022-06-16 ENCOUNTER — Other Ambulatory Visit: Payer: Self-pay

## 2022-06-25 ENCOUNTER — Other Ambulatory Visit (HOSPITAL_COMMUNITY): Payer: Self-pay

## 2022-06-25 ENCOUNTER — Other Ambulatory Visit: Payer: Self-pay | Admitting: Sports Medicine

## 2022-06-25 DIAGNOSIS — E6609 Other obesity due to excess calories: Secondary | ICD-10-CM

## 2022-06-25 MED ORDER — HYDROXYZINE HCL 50 MG PO TABS
50.0000 mg | ORAL_TABLET | Freq: Every evening | ORAL | 0 refills | Status: DC | PRN
  Filled 2022-06-25: qty 30, 15d supply, fill #0

## 2022-06-25 MED ORDER — MOUNJARO 2.5 MG/0.5ML ~~LOC~~ SOAJ
2.5000 mg | SUBCUTANEOUS | 0 refills | Status: DC
  Filled 2022-06-25: qty 2, 28d supply, fill #0

## 2022-06-28 LAB — HM PAP SMEAR: HM Pap smear: NORMAL

## 2022-06-28 LAB — RESULTS CONSOLE HPV: CHL HPV: NEGATIVE

## 2022-07-12 NOTE — Progress Notes (Signed)
HPI: FU bicuspid aortic valve. Stress echocardiogram in March of 2013 showed normal LV function, probable bicuspid aortic valve and no stress-induced wall motion abnormalities. Monitor in March of 2013 showed sinus rhythm with PACs and short bursts of SVT but no sustained arrhythmias. MRA of cerebral vasculature February 2016 showed no aneurysm.  Monitor January 2020 showed sinus rhythm, sinus tachycardia, ectopic atrial rhythm, brief ectopic atrial tachycardia and 7 beats nonsustained ventricular tachycardia. Renal Dopplers March 2020 showed no renal artery stenosis.  MRA March 2022 showed ectasia of the ascending thoracic aorta at 35 mm.  Echocardiogram March 2023 showed normal LV function.  Since she was last seen she has some dyspnea on exertion but no orthopnea, PND, chest pain or syncope.  She denies palpitations.  She has some fatigue and occasional mild pedal edema.  Current Outpatient Medications  Medication Sig Dispense Refill   hydrOXYzine (ATARAX) 50 MG tablet Take 1 tablet (50 mg total) by mouth at bedtime and may repeat dose one time if needed for itching 30 tablet 0   LORazepam (ATIVAN) 0.5 MG tablet Take 1 tablet (0.5 mg total) by mouth 2 (two) times daily as needed for anxiety. 30 tablet 0   losartan (COZAAR) 25 MG tablet Take 1 tablet (25 mg total) by mouth daily. 90 tablet 3   nebivolol (BYSTOLIC) 10 MG tablet Take 1 tablet (10 mg total) by mouth daily. 90 tablet 3   promethazine (PHENERGAN) 25 MG tablet Take 1 tablet by mouth 2 (two) times daily as needed.     scopolamine (TRANSDERM-SCOP) 1 MG/3DAYS Place 1 patch (1.5 mg total) onto the skin every 3 (three) days. 10 patch 11   tirzepatide (MOUNJARO) 2.5 MG/0.5ML Pen Inject 2.5 mg into the skin once a week. 2 mL 0   triamcinolone cream (KENALOG) 0.1 % Apply 1 application topically 2 (two) times daily. 45 g 4   No current facility-administered medications for this visit.     Past Medical History:  Diagnosis Date   Anxiety     Back pain    Bicuspid aortic valve    Breast cancer ([place])    Cholelithiasis 01/2020   COVID-19 07/15/2020   Depression    Hospital discharge follow-up 02/25/2020   Joint pain    Kidney cyst, acquired    Obesity    Palpitations    Polycythemia vera (HCC)    PONV (postoperative nausea and vomiting)    Oct 31 2019 hernia with out problems   Right flank pain 07/25/2019   Thoracic aortic aneurysm (HCC)    Thyroid nodule    Ventral hernia without obstruction or gangrene 09/20/2019   Formatting of this note might be different from the original. Added automatically from request for surgery [id number]    Past Surgical History:  Procedure Laterality Date   BILIARY DILATION  02/14/2020   Procedure: BILIARY DILATION;  Surgeon: Ronnette Juniper, MD;  Location: [facility];  Service: Gastroenterology;;   [place] N/A 12/28/2012   Procedure: [place];  Surgeon: Cyril Mourning, MD;  Location: [facility];  Service: Gynecology;  Laterality: N/A;   ERCP N/A 02/14/2020   Procedure: ENDOSCOPIC RETROGRADE CHOLANGIOPANCREATOGRAPHY (ERCP);  Surgeon: Ronnette Juniper, MD;  Location: [facility];  Service: Gastroenterology;  Laterality: N/A;   LAPAROSCOPIC CHOLECYSTECTOMY SINGLE PORT N/A 02/13/2020   Procedure: LAPAROSCOPIC CHOLECYSTECTOMY, CHOLANGIOGRAM, LIVER BIOPSY;  Surgeon: Michael Boston, MD;  Location: [facility];  Service: General;  Laterality: N/A;  MASTECTOMY     bilateral   PARTIAL NEPHRECTOMY     RT    SPHINCTEROTOMY  02/14/2020   Procedure: SPHINCTEROTOMY;  Surgeon: Ronnette Juniper, MD;  Location: [facility];  Service: Gastroenterology;;   TONSILLECTOMY     TUBAL LIGATION      Social History   Socioeconomic History   Marital status: Married    Spouse name: Don   Number of children: 3   Years of education: Not on file   Highest education level: Not on file  Occupational History   Occupation: Facilities manager: [employer]  Tobacco Use   Smoking status: Never   Smokeless tobacco: Never  Vaping Use   Vaping Use: Never used  Substance and Sexual Activity   Alcohol use: Yes    Comment: Occasional   Drug use: No   Sexual activity: Not on file  Other Topics Concern   Not on file  Social History Narrative   Not on file   Social Determinants of Health   Financial Resource Strain: Not on file  Food Insecurity: Not on file  Transportation Needs: Not on file  Physical Activity: Not on file  Stress: Not on file  Social Connections: Not on file  Intimate Partner Violence: Not on file    Family History  Problem Relation Age of Onset   Cancer Mother        lung   Depression Mother    Anxiety disorder Mother    Bipolar disorder Mother    Alcoholism Mother    COPD Father    Congestive Heart Failure Father    Depression Father    Alcoholism Father    Stroke Maternal Grandfather     ROS: no fevers or chills, productive cough, hemoptysis, dysphasia, odynophagia, melena, hematochezia, dysuria, hematuria, rash, seizure activity, orthopnea, PND, pedal edema, claudication. Remaining systems are negative.  Physical Exam: Well-developed well-nourished in no acute distress.  Skin is warm and dry.  HEENT is normal.  Neck is supple.  Chest is clear to auscultation with normal expansion.  Cardiovascular exam is regular rate and rhythm.  Abdominal exam nontender or distended. No masses palpated. Extremities show no edema. neuro grossly intact  ECG normal sinus rhythm at a rate of 63, nonspecific ST changes.  Personally reviewed  A/P  1 history of bicuspid aortic valve-no aortic stenosis or aortic insufficiency on most recent echocardiogram.  Will plan follow-up echocardiogram when she returns in 1 year.  2 hypertension-patient's blood pressure is controlled.  Continue present medical regimen.  3 thoracic aortic ectasia-we will plan MRA March 2024.  4 palpitations-she is  describing occasional fatigue.  She would like to see if she can come off of her beta-blocker.  Decrease bystolic to 5 mg for 3 days and then discontinue.  If palpitations worsen can resume.  Kirk Ruths, MD

## 2022-07-26 ENCOUNTER — Ambulatory Visit: Payer: 59 | Admitting: Cardiology

## 2022-07-26 ENCOUNTER — Encounter: Payer: Self-pay | Admitting: Cardiology

## 2022-07-26 VITALS — BP 126/84 | HR 63 | Ht 65.0 in | Wt 209.1 lb

## 2022-07-26 DIAGNOSIS — Q2381 Bicuspid aortic valve: Secondary | ICD-10-CM

## 2022-07-26 DIAGNOSIS — I10 Essential (primary) hypertension: Secondary | ICD-10-CM

## 2022-07-26 DIAGNOSIS — Q231 Congenital insufficiency of aortic valve: Secondary | ICD-10-CM

## 2022-07-26 DIAGNOSIS — I712 Thoracic aortic aneurysm, without rupture, unspecified: Secondary | ICD-10-CM

## 2022-07-26 DIAGNOSIS — R002 Palpitations: Secondary | ICD-10-CM | POA: Diagnosis not present

## 2022-07-26 NOTE — Patient Instructions (Signed)
MRA OF THE CHEST AT [place]   Follow-Up   At [facility], you and your health needs are our priority.  As part of our continuing mission to provide you with exceptional heart care, we have created designated Provider Care Teams.  These Care Teams include your primary Cardiologist (physician) and Advanced Practice Providers (APPs -  Physician Assistants and Nurse Practitioners) who all work together to provide you with the care you need, when you need it.  We recommend signing up for the patient portal called "MyChart".  Sign up information is provided on this After Visit Summary.  MyChart is used to connect with patients for Virtual Visits (Telemedicine).  Patients are able to view lab/test results, encounter notes, upcoming appointments, etc.  Non-urgent messages can be sent to your provider as well.   To learn more about what you can do with MyChart, go to [email or web address].    Your next appointment:   12 month(s)  Provider:   Kirk Ruths, MD

## 2022-07-28 ENCOUNTER — Encounter: Payer: Self-pay | Admitting: Sports Medicine

## 2022-07-28 ENCOUNTER — Ambulatory Visit: Payer: 59 | Admitting: Sports Medicine

## 2022-07-28 ENCOUNTER — Other Ambulatory Visit (HOSPITAL_COMMUNITY): Payer: Self-pay

## 2022-07-28 VITALS — BP 124/83 | HR 71 | Ht 65.0 in | Wt 206.8 lb

## 2022-07-28 DIAGNOSIS — Z0000 Encounter for general adult medical examination without abnormal findings: Secondary | ICD-10-CM

## 2022-07-28 DIAGNOSIS — E6609 Other obesity due to excess calories: Secondary | ICD-10-CM | POA: Diagnosis not present

## 2022-07-28 DIAGNOSIS — E66811 Obesity, class 1: Secondary | ICD-10-CM

## 2022-07-28 DIAGNOSIS — Z6832 Body mass index (BMI) 32.0-32.9, adult: Secondary | ICD-10-CM | POA: Diagnosis not present

## 2022-07-28 MED ORDER — MOUNJARO 5 MG/0.5ML ~~LOC~~ SOAJ
5.0000 mg | SUBCUTANEOUS | 0 refills | Status: DC
  Filled 2022-07-28: qty 2, 28d supply, fill #0

## 2022-07-28 MED ORDER — MOUNJARO 10 MG/0.5ML ~~LOC~~ SOAJ
10.0000 mg | SUBCUTANEOUS | 0 refills | Status: DC
  Filled 2022-07-28 – 2022-09-27 (×3): qty 2, 28d supply, fill #0

## 2022-07-28 MED ORDER — MOUNJARO 12.5 MG/0.5ML ~~LOC~~ SOAJ
12.5000 mg | SUBCUTANEOUS | 0 refills | Status: DC
  Filled 2022-07-28 – 2022-09-21 (×2): qty 2, 28d supply, fill #0

## 2022-07-28 MED ORDER — MOUNJARO 7.5 MG/0.5ML ~~LOC~~ SOAJ
7.5000 mg | SUBCUTANEOUS | 0 refills | Status: DC
  Filled 2022-07-28 – 2022-08-25 (×2): qty 2, 28d supply, fill #0

## 2022-07-28 MED ORDER — MOUNJARO 15 MG/0.5ML ~~LOC~~ SOAJ
15.0000 mg | SUBCUTANEOUS | 11 refills | Status: DC
  Filled 2022-07-28: qty 2, 28d supply, fill #0

## 2022-07-28 NOTE — Progress Notes (Signed)
    Procedures performed today:    None.  Independent interpretation of notes and tests performed by another provider:   None.  Brief History, Exam, Impression, and Recommendations:    Class 1 obesity with serious comorbidity and body mass index (BMI) of 32.0 to 32.9 in adult Fortunately Mounjaro was approved, I will go and call in the next several doses, she has been tolerating the 2.5 mg well. I like to see her back in about 6 months.  Annual physical exam Last annual physical was August 26, 2021, we will do the next physical August 30, 2022. Up-to-date on her screening measures. She will be due for colonoscopy this year, she did Cologuard 3 years ago but would prefer to do colonoscopy at this time.  Chronic process not at goal with pharmacologic intervention  ____________________________________________ [name]. Dianah Field, M.D., ABFM., CAQSM., AME. Primary Care and Sports Medicine [facility]  Adjunct Professor of [place] [employer] [facility] [employer] [facility] [employer] [employer] of [employer]  Risk manager

## 2022-07-28 NOTE — Assessment & Plan Note (Addendum)
Last annual physical was August 26, 2021, we will do the next physical August 30, 2022. Up-to-date on her screening measures. She will be due for colonoscopy this year, she did Cologuard 3 years ago but would prefer to do colonoscopy at this time.

## 2022-07-28 NOTE — Assessment & Plan Note (Signed)
Fortunately Mounjaro was approved, I will go and call in the next several doses, she has been tolerating the 2.5 mg well. I like to see her back in about 6 months.

## 2022-07-29 ENCOUNTER — Other Ambulatory Visit: Payer: Self-pay | Admitting: Cardiology

## 2022-07-29 ENCOUNTER — Other Ambulatory Visit: Payer: Self-pay | Admitting: Sports Medicine

## 2022-07-29 DIAGNOSIS — I10 Essential (primary) hypertension: Secondary | ICD-10-CM

## 2022-07-30 ENCOUNTER — Other Ambulatory Visit (HOSPITAL_COMMUNITY): Payer: Self-pay

## 2022-07-30 ENCOUNTER — Other Ambulatory Visit: Payer: Self-pay

## 2022-07-30 MED ORDER — LOSARTAN POTASSIUM 25 MG PO TABS
25.0000 mg | ORAL_TABLET | Freq: Every day | ORAL | 3 refills | Status: DC
  Filled 2022-07-30: qty 90, 90d supply, fill #0
  Filled 2022-11-18: qty 90, 90d supply, fill #1
  Filled 2023-03-02: qty 90, 90d supply, fill #2
  Filled 2023-06-06: qty 90, 90d supply, fill #3

## 2022-08-10 ENCOUNTER — Telehealth: Payer: Self-pay | Admitting: Gastroenterology

## 2022-08-10 NOTE — Telephone Encounter (Signed)
Hi Dr. Bryan Lemma,  We received a referral for this patient to have a colonoscopy done, she did see Dr. Therisa Doyne in 2021 was scheduled for an ERCP but it was canceled. The patient is specifically asking for you to continue her GI care if possible her office note from [place] was obtained for you to review and advise on scheduling.   Thanks

## 2022-08-24 NOTE — Telephone Encounter (Signed)
Patient is calling to check up on status on transfer of care.

## 2022-08-24 NOTE — Telephone Encounter (Signed)
I believe I have already reviewed the printed copy of this referral and approved transfer.  If this is a referral for screening colonoscopy, ok to schedule directly with me in the [place].  If any active GI symptoms or other concerns, can schedule OV with me first.

## 2022-08-25 ENCOUNTER — Other Ambulatory Visit (HOSPITAL_COMMUNITY): Payer: Self-pay

## 2022-08-25 ENCOUNTER — Encounter: Payer: Self-pay | Admitting: Gastroenterology

## 2022-08-26 ENCOUNTER — Other Ambulatory Visit (HOSPITAL_COMMUNITY): Payer: Self-pay

## 2022-08-26 ENCOUNTER — Other Ambulatory Visit: Payer: Self-pay | Admitting: Sports Medicine

## 2022-08-30 ENCOUNTER — Other Ambulatory Visit (HOSPITAL_COMMUNITY): Payer: Self-pay

## 2022-08-30 ENCOUNTER — Encounter: Payer: Self-pay | Admitting: Sports Medicine

## 2022-08-30 MED ORDER — HYDROXYZINE HCL 50 MG PO TABS
50.0000 mg | ORAL_TABLET | Freq: Every evening | ORAL | 0 refills | Status: DC | PRN
  Filled 2022-08-30: qty 30, 15d supply, fill #0

## 2022-08-30 NOTE — Telephone Encounter (Signed)
OV sched 4/18 at 2:00

## 2022-09-21 ENCOUNTER — Other Ambulatory Visit (HOSPITAL_COMMUNITY): Payer: Self-pay

## 2022-09-23 ENCOUNTER — Other Ambulatory Visit (HOSPITAL_COMMUNITY): Payer: Self-pay

## 2022-09-27 ENCOUNTER — Other Ambulatory Visit (HOSPITAL_COMMUNITY): Payer: Self-pay

## 2022-09-28 ENCOUNTER — Other Ambulatory Visit (HOSPITAL_COMMUNITY): Payer: Self-pay

## 2022-09-29 ENCOUNTER — Other Ambulatory Visit (HOSPITAL_COMMUNITY): Payer: Self-pay

## 2022-10-13 ENCOUNTER — Ambulatory Visit: Payer: 59 | Admitting: Family Medicine

## 2022-10-13 ENCOUNTER — Encounter: Payer: Self-pay | Admitting: Family Medicine

## 2022-10-13 VITALS — BP 127/81 | HR 79 | Ht 65.0 in | Wt 206.0 lb

## 2022-10-13 DIAGNOSIS — R350 Frequency of micturition: Secondary | ICD-10-CM

## 2022-10-13 DIAGNOSIS — R109 Unspecified abdominal pain: Secondary | ICD-10-CM | POA: Diagnosis not present

## 2022-10-13 LAB — POCT URINALYSIS DIP (CLINITEK)
Bilirubin, UA: NEGATIVE
Blood, UA: NEGATIVE
Glucose, UA: NEGATIVE mg/dL
Ketones, POC UA: NEGATIVE mg/dL
Leukocytes, UA: NEGATIVE
Nitrite, UA: NEGATIVE
POC PROTEIN,UA: NEGATIVE
Spec Grav, UA: 1.02 (ref 1.010–1.025)
Urobilinogen, UA: 0.2 E.U./dL
pH, UA: 6 (ref 5.0–8.0)

## 2022-10-13 MED ORDER — SULFAMETHOXAZOLE-TRIMETHOPRIM 800-160 MG PO TABS: 1.0000 | ORAL_TABLET | Freq: Two times a day (BID) | ORAL | 0 refills | Status: DC

## 2022-10-13 NOTE — Progress Notes (Signed)
   Acute Office Visit  Subjective:     Patient ID: Melissa Atkinson, female    DOB: [birth year], 52 y.o.   MRN: [id number]  Chief Complaint  Patient presents with   Urinary Tract Infection    HPI Patient is in today for couple of days of right-sided mid to low back pain with some urinary frequency and urgency.  She noticed a little blood when she wiped yesterday.  But no gross hematuria.  Bowels are moving normally.  Denies any vaginal symptoms.  No fevers or chills or sweats though she says she did feel little bit warm last night.  The pain has been mild it has not been severe no prior history of kidney stones.  She is never had a UTI before.  ROS      Objective:    BP 127/81   Pulse 79   Ht  (1.651 m)   Wt 206 lb (93.4 kg)   SpO2 98%   BMI 34.28 kg/m    Physical Exam Vitals and nursing note reviewed.  Constitutional:      Appearance: She is well-developed.  HENT:     Head: Normocephalic and atraumatic.  Cardiovascular:     Rate and Rhythm: Normal rate and regular rhythm.     Heart sounds: Normal heart sounds.  Pulmonary:     Effort: Pulmonary effort is normal.     Breath sounds: Normal breath sounds.  Musculoskeletal:     Comments: No CVA tenderness. Tender over the right low mid back above the SI joint  Skin:    General: Skin is warm and dry.  Neurological:     Mental Status: She is alert and oriented to person, place, and time.  Psychiatric:        Behavior: Behavior normal.     Results for orders placed or performed in visit on 10/13/22  POCT URINALYSIS DIP (CLINITEK)  Result Value Ref Range   Color, UA yellow yellow   Clarity, UA clear clear   Glucose, UA negative negative mg/dL   Bilirubin, UA negative negative   Ketones, POC UA negative negative mg/dL   Spec Grav, UA 7.829 5.621 - 1.025   Blood, UA negative negative   pH, UA 6.0 5.0 - 8.0   POC PROTEIN,UA negative negative, trace   Urobilinogen, UA 0.2 0.2 or 1.0 E.U./dL   Nitrite, UA  Negative Negative   Leukocytes, UA Negative Negative        Assessment & Plan:   Problem List Items Addressed This Visit   None Visit Diagnoses     Acute right flank pain    -  Primary   Relevant Orders   POCT URINALYSIS DIP (CLINITEK) (Completed)   Urine Culture   Urine frequency          Right flank pain with urgency and frequency-we will go ahead and treat for UTI.  That the dipstick looks negative we will send for culture for confirmation we will call her with an update.  If develops any new or worsening symptoms or is not improving or the culture is negative then we will do additional workup.  Meds ordered this encounter  Medications   sulfamethoxazole-trimethoprim (BACTRIM DS) 800-160 MG tablet    Sig: Take 1 tablet by mouth 2 (two) times daily.    Dispense:  6 tablet    Refill:  0    No follow-ups on file.  Nani Gasser, MD

## 2022-10-14 ENCOUNTER — Other Ambulatory Visit (HOSPITAL_COMMUNITY): Payer: Self-pay

## 2022-10-14 ENCOUNTER — Ambulatory Visit: Payer: 59 | Admitting: Gastroenterology

## 2022-10-14 ENCOUNTER — Encounter: Payer: Self-pay | Admitting: Gastroenterology

## 2022-10-14 VITALS — BP 136/86 | HR 82 | Ht 65.0 in | Wt 208.0 lb

## 2022-10-14 DIAGNOSIS — Z800 Family history of malignant neoplasm of digestive organs: Secondary | ICD-10-CM | POA: Diagnosis not present

## 2022-10-14 DIAGNOSIS — Z1211 Encounter for screening for malignant neoplasm of colon: Secondary | ICD-10-CM | POA: Diagnosis not present

## 2022-10-14 DIAGNOSIS — R1310 Dysphagia, unspecified: Secondary | ICD-10-CM | POA: Diagnosis not present

## 2022-10-14 MED ORDER — CLENPIQ 10-3.5-12 MG-GM -GM/175ML PO SOLN
1.0000 | Freq: Once | ORAL | 0 refills | Status: AC
  Filled 2022-10-14: qty 350, 2d supply, fill #0

## 2022-10-14 NOTE — Patient Instructions (Addendum)
  You have been scheduled for a colonoscopy. Please follow written instructions given to you at your visit today.  Please pick up your prep supplies at the pharmacy within the next 1-3 days. If you use inhalers (even only as needed), please bring them with you on the day of your procedure.    _______________________________________________________  If your blood pressure at your visit was 140/90 or greater, please contact your primary care physician to follow up on this.  _______________________________________________________  If you are age 54 or younger, your body mass index should be between 19-25. Your Body mass index is 34.61 kg/m. If this is out of the aformentioned range listed, please consider follow up with your Primary Care Provider.   __________________________________________________________  The [facility] providers would like to encourage you to use [facility] to communicate with providers for non-urgent requests or questions.  Due to long hold times on the telephone, sending your provider a message by [facility] may be a faster and more efficient way to get a response.  Please allow 48 business hours for a response.  Please remember that this is for non-urgent requests.   Due to recent changes in healthcare laws, you may see the results of your imaging and laboratory studies on MyChart before your provider has had a chance to review them.  We understand that in some cases there may be results that are confusing or concerning to you. Not all laboratory results come back in the same time frame and the provider may be waiting for multiple results in order to interpret others.  Please give [place] 48 hours in order for your provider to thoroughly review all the results before contacting the office for clarification of your results.    Thank you for choosing me and [facility].  Vito Cirigliano, D.O.

## 2022-10-14 NOTE — Progress Notes (Signed)
Chief Complaint: Colon cancer screening, dysphagia   Referring Provider:     Monica Becton, MD    HPI:     Melissa Atkinson is a 52 y.o. female with medical history as outlined below referred to the [facility] for evaluation of colon cancer screening and discussed screening options, along with evaluation for dysphagia.  Completed Cologuard in 08/2019 and negative.  No hematochezia, melena.  Does have intermittent loose stools and postprandial urgency since cholecystectomy in 2021.  Family history notable for MGM died with Colon CA.  Separately, describes a long history of intermittent solid food dysphagia for the last 10+ years.  Was evaluated by Dr. Dulce Sellar for this in the past with negative/normal esophagram in 03/2013.  No previous EGD.  Has continued to have dysphagia, pointing to her suprasternal notch.  Worse with bread, rice, pasta.  Has had to regurgitate food back out at times.  No history of food impaction requiring emergent ER evaluation.  Otherwise, no history of heartburn, regurgitation, belching.  No longer taking Mounjaro.   Hospital admission in 01/2020 Lithiasis  for symptomatic: Hs/p lap ccy with IOC showing choledocholithiasis.  Underwent ERCP 02/14/2020 by Dr. Marca Ancona and developed post ERCP pancreatitis with mild ileus, treated with bowel rest and IV hydration.     Endoscopic History: - 02/14/2020: ERCP (Dr. Pati Gallo): 10 mm sphincterotomy, limited cholangiogram.  Dilation of distal CBD with 10 mm balloon, and nothing removed on several balloon sweeps      Latest Ref Rng & Units 08/26/2021   12:00 AM 06/06/2020    9:18 AM 02/21/2020    5:04 AM  CBC  WBC 3.8 - 10.8 Thousand/uL 6.9  7.6  10.3   Hemoglobin 11.7 - 15.5 g/dL 16.1  09.6  04.5   Hematocrit 35.0 - 45.0 % 43.0  37.8  31.9   Platelets 140 - 400 Thousand/uL 174  167  247       Latest Ref Rng & Units 08/26/2021   12:00 AM 04/24/2021   12:00 AM 06/06/2020    9:18 AM  CMP   Glucose 65 - 99 mg/dL 90  86  94   BUN 7 - 25 mg/dL Creatinine 0.50 - 1.03 mg/dL 4.09  8.11  9.14   Sodium 135 - 146 mmol/L 141  140  142   Potassium 3.5 - 5.3 mmol/L 5.0  4.5  4.2   Chloride 98 - 110 mmol/L 105  104  103   CO2 20 - 32 mmol/L 30  32  28   Calcium 8.6 - 10.4 mg/dL 9.7  9.4  9.3   Total Protein 6.1 - 8.1 g/dL 6.9   6.6   Total Bilirubin 0.2 - 1.2 mg/dL 0.5   0.3   AST 10 - 35 U/L 13   13   ALT 6 - 29 U/L 12   12      Past Medical History:  Diagnosis Date   Anxiety    Back pain    Bicuspid aortic valve    Breast cancer    Cholelithiasis 01/2020   COVID-19 07/15/2020   Depression    Hospital discharge follow-up 02/25/2020   Joint pain    Kidney cyst, acquired    Obesity    Palpitations    Polycythemia vera    PONV (postoperative nausea and vomiting)    Oct 31 2019 hernia with out problems  Right flank pain 07/25/2019   Thoracic aortic aneurysm    Thyroid nodule    Ventral hernia without obstruction or gangrene 09/20/2019   Formatting of this note might be different from the original. Added automatically from request for surgery [id number]     Past Surgical History:  Procedure Laterality Date   BILIARY DILATION  02/14/2020   Procedure: BILIARY DILATION;  Surgeon: Kerin Salen, MD;  Location: [facility];  Service: Gastroenterology;;   DILITATION & CURRETTAGE/HYSTROSCOPY WITH THERMACHOICE ABLATION N/A 12/28/2012   Procedure: DILATATION & CURETTAGE/HYSTEROSCOPY WITH THERMACHOICE ABLATION;  Surgeon: Jeani Hawking, MD;  Location: [facility];  Service: Gynecology;  Laterality: N/A;   ERCP N/A 02/14/2020   Procedure: ENDOSCOPIC RETROGRADE CHOLANGIOPANCREATOGRAPHY (ERCP);  Surgeon: Kerin Salen, MD;  Location: [facility];  Service: Gastroenterology;  Laterality: N/A;   LAPAROSCOPIC CHOLECYSTECTOMY SINGLE PORT N/A 02/13/2020   Procedure: LAPAROSCOPIC CHOLECYSTECTOMY, CHOLANGIOGRAM, LIVER BIOPSY;  Surgeon: Karie Soda, MD;  Location: [facility];  Service:  General;  Laterality: N/A;   MASTECTOMY     bilateral   PARTIAL NEPHRECTOMY     RT    SPHINCTEROTOMY  02/14/2020   Procedure: SPHINCTEROTOMY;  Surgeon: Kerin Salen, MD;  Location: [facility];  Service: Gastroenterology;;   TONSILLECTOMY     TUBAL LIGATION     Family History  Problem Relation Age of Onset   Cancer Mother        lung   Depression Mother    Anxiety disorder Mother    Bipolar disorder Mother    Alcoholism Mother    COPD Father    Congestive Heart Failure Father    Depression Father    Alcoholism Father    Stroke Maternal Grandfather    Colon cancer Neg Hx    Stomach cancer Neg Hx    Esophageal cancer Neg Hx    Social History   Tobacco Use   Smoking status: Never   Smokeless tobacco: Never  Vaping Use   Vaping Use: Never used  Substance Use Topics   Alcohol use: Yes    Comment: Occasional   Drug use: No   Current Outpatient Medications  Medication Sig Dispense Refill   hydrOXYzine (ATARAX) 50 MG tablet Take 1 tablet (50 mg total) by mouth at bedtime and may repeat dose one time if needed for itching 30 tablet 0   LORazepam (ATIVAN) 0.5 MG tablet Take 1 tablet (0.5 mg total) by mouth 2 (two) times daily as needed for anxiety. 30 tablet 0   losartan (COZAAR) 25 MG tablet Take 1 tablet (25 mg total) by mouth daily. 90 tablet 3   sulfamethoxazole-trimethoprim (BACTRIM DS) 800-160 MG tablet Take 1 tablet by mouth 2 (two) times daily. 6 tablet 0   No current facility-administered medications for this visit.   No Known Allergies   Review of Systems: All systems reviewed and negative except where noted in HPI.     Physical Exam:    Wt Readings from Last 3 Encounters:  10/14/22 208 lb (94.3 kg)  10/13/22 206 lb (93.4 kg)  07/28/22 206 lb 12.8 oz (93.8 kg)    BP 136/86   Pulse 82   Ht  (1.651 m)   Wt 208 lb (94.3 kg)   BMI 34.61 kg/m  Constitutional:  Pleasant, in no acute distress. Psychiatric: Normal mood and affect. Behavior is  normal. Neurological: Alert and oriented to person place and time. Skin: Skin is warm and dry. No rashes noted.   ASSESSMENT AND PLAN;   1)  Dysphagia Discussed DDx for intermittent solid food dysphagia, to include esophageal stricture, peptic stricture, EOE, motility disorder, etc. - EGD with esophageal dilation and/or biopsies as appropriate - Continue cutting food into small pieces, chewing thoroughly, and drink plenty of fluids with meals  2) Colon cancer screening 3) Family history of colon cancer (maternal grandmother) Discussed CRC screening options at length today, and she very strongly wants to proceed with optical colonoscopy. - Schedule colonoscopy   The indications, risks, and benefits of EGD and colonoscopy were explained to the patient in detail. Risks include but are not limited to bleeding, perforation, adverse reaction to medications, and cardiopulmonary compromise. Sequelae include but are not limited to the possibility of surgery, hospitalization, and mortality. The patient verbalized understanding and wished to proceed. All questions answered, referred to scheduler and bowel prep ordered. Further recommendations pending results of the exam.     Shellia Cleverly, DO, FACG  10/14/2022, 2:30 PM   Monica Becton,*

## 2022-10-15 LAB — URINE CULTURE
MICRO NUMBER:: 14837404
Result:: NO GROWTH
SPECIMEN QUALITY:: ADEQUATE

## 2022-10-15 NOTE — Progress Notes (Signed)
Hi Melissa Atkinson, your urine culture came back negative.  No infection which is great.  If you are taking the antibiotic you can go ahead and stop it.  Do think some of your pain could also just be your back.  You can also work on a trial of an anti-inflammatory such as Aleve or ibuprofen as well as some gentle stretches and heat or ice and see if that helps over the weekend.

## 2022-10-31 ENCOUNTER — Other Ambulatory Visit (HOSPITAL_COMMUNITY): Payer: Self-pay

## 2022-11-18 ENCOUNTER — Other Ambulatory Visit: Payer: Self-pay | Admitting: Sports Medicine

## 2022-11-18 ENCOUNTER — Other Ambulatory Visit (HOSPITAL_COMMUNITY): Payer: Self-pay

## 2022-11-18 DIAGNOSIS — F410 Panic disorder [episodic paroxysmal anxiety] without agoraphobia: Secondary | ICD-10-CM

## 2022-11-18 MED ORDER — LORAZEPAM 0.5 MG PO TABS
0.5000 mg | ORAL_TABLET | Freq: Two times a day (BID) | ORAL | 0 refills | Status: DC | PRN
Start: 2022-11-18 — End: 2023-03-10
  Filled 2022-11-18: qty 30, 15d supply, fill #0

## 2022-11-18 MED ORDER — HYDROXYZINE HCL 50 MG PO TABS
50.0000 mg | ORAL_TABLET | Freq: Every evening | ORAL | 0 refills | Status: DC | PRN
  Filled 2022-11-18: qty 30, 15d supply, fill #0

## 2022-11-19 ENCOUNTER — Other Ambulatory Visit: Payer: Self-pay

## 2022-11-24 ENCOUNTER — Ambulatory Visit (HOSPITAL_BASED_OUTPATIENT_CLINIC_OR_DEPARTMENT_OTHER): Payer: 59

## 2022-11-25 ENCOUNTER — Encounter: Payer: Self-pay | Admitting: Gastroenterology

## 2022-11-25 ENCOUNTER — Ambulatory Visit (AMBULATORY_SURGERY_CENTER): Payer: 59 | Admitting: Gastroenterology

## 2022-11-25 ENCOUNTER — Other Ambulatory Visit (HOSPITAL_COMMUNITY): Payer: Self-pay

## 2022-11-25 VITALS — BP 123/77 | HR 69 | Temp 98.6°F | Resp 12 | Ht 65.0 in | Wt 208.0 lb

## 2022-11-25 DIAGNOSIS — R194 Change in bowel habit: Secondary | ICD-10-CM

## 2022-11-25 DIAGNOSIS — K5730 Diverticulosis of large intestine without perforation or abscess without bleeding: Secondary | ICD-10-CM

## 2022-11-25 DIAGNOSIS — K2970 Gastritis, unspecified, without bleeding: Secondary | ICD-10-CM

## 2022-11-25 DIAGNOSIS — Z1211 Encounter for screening for malignant neoplasm of colon: Secondary | ICD-10-CM | POA: Diagnosis not present

## 2022-11-25 DIAGNOSIS — R1310 Dysphagia, unspecified: Secondary | ICD-10-CM | POA: Diagnosis not present

## 2022-11-25 DIAGNOSIS — K2210 Ulcer of esophagus without bleeding: Secondary | ICD-10-CM

## 2022-11-25 DIAGNOSIS — K2100 Gastro-esophageal reflux disease with esophagitis, without bleeding: Secondary | ICD-10-CM | POA: Diagnosis not present

## 2022-11-25 DIAGNOSIS — F419 Anxiety disorder, unspecified: Secondary | ICD-10-CM | POA: Diagnosis not present

## 2022-11-25 DIAGNOSIS — Z800 Family history of malignant neoplasm of digestive organs: Secondary | ICD-10-CM | POA: Diagnosis not present

## 2022-11-25 DIAGNOSIS — E669 Obesity, unspecified: Secondary | ICD-10-CM | POA: Diagnosis not present

## 2022-11-25 DIAGNOSIS — K6389 Other specified diseases of intestine: Secondary | ICD-10-CM | POA: Diagnosis not present

## 2022-11-25 DIAGNOSIS — F32A Depression, unspecified: Secondary | ICD-10-CM | POA: Diagnosis not present

## 2022-11-25 MED ORDER — PANTOPRAZOLE SODIUM 40 MG PO TBEC
40.0000 mg | DELAYED_RELEASE_TABLET | Freq: Two times a day (BID) | ORAL | 0 refills | Status: DC
  Filled 2022-11-25: qty 112, 56d supply, fill #0

## 2022-11-25 MED ORDER — SODIUM CHLORIDE 0.9 % IV SOLN
500.0000 mL | Freq: Once | INTRAVENOUS | Status: DC
Start: 2022-11-25 — End: 2022-11-25

## 2022-11-25 MED ORDER — PANTOPRAZOLE SODIUM 40 MG PO TBEC
40.0000 mg | DELAYED_RELEASE_TABLET | Freq: Every day | ORAL | 3 refills | Status: AC
  Filled 2022-11-25 – 2023-01-31 (×2): qty 90, 90d supply, fill #0
  Filled 2023-04-07 – 2023-04-29 (×2): qty 90, 90d supply, fill #1

## 2022-11-25 NOTE — Op Note (Signed)
[facility] Patient Name: Melissa Atkinson Procedure Date: 11/25/2022 1:15 PM MRN: [id number] Endoscopist: Doristine Locks , MD, [id number] Age: 52 Referring MD:  Date of Birth: 1971-04-15 Gender: Female Account #: [id number] Procedure:                Colonoscopy Indications:              Screening for colorectal malignant neoplasm                           Separately, intermittent loose stools. Medicines:                Monitored Anesthesia Care Procedure:                Pre-Anesthesia Assessment:                           - Prior to the procedure, a History and Physical                            was performed, and patient medications and                            allergies were reviewed. The patient's tolerance of                            previous anesthesia was also reviewed. The risks                            and benefits of the procedure and the sedation                            options and risks were discussed with the patient.                            All questions were answered, and informed consent                            was obtained. Prior Anticoagulants: The patient has                            taken no anticoagulant or antiplatelet agents. ASA                            Grade Assessment: II - A patient with mild systemic                            disease. After reviewing the risks and benefits,                            the patient was deemed in satisfactory condition to                            undergo the procedure.  After obtaining informed consent, the colonoscope                            was passed under direct vision. Throughout the                            procedure, the patient's blood pressure, pulse, and                            oxygen saturations were monitored continuously. The                            CF [id number] #[id number] was introduced through the anus                            and advanced to the the  cecum, identified by                            appendiceal orifice and ileocecal valve. The                            colonoscopy was performed without difficulty. The                            patient tolerated the procedure well. The quality                            of the bowel preparation was good. The ileocecal                            valve, appendiceal orifice, and rectum were                            photographed. Scope In: 1:44:59 PM Scope Out: 2:01:43 PM Scope Withdrawal Time: 0 hours 11 minutes 49 seconds  Total Procedure Duration: 0 hours 16 minutes 44 seconds  Findings:                 The perianal and digital rectal examinations were                            normal.                           Multiple large-mouthed and small-mouthed                            diverticula were found in the sigmoid colon,                            descending colon and transverse colon.                           Normal mucosa was found in the entire colon.  Biopsies for histology were taken with a cold                            forceps from the right colon and left colon for                            evaluation of microscopic colitis. Estimated blood                            loss was minimal.                           The retroflexed view of the distal rectum and anal                            verge was normal and showed no anal or rectal                            abnormalities. Complications:            No immediate complications. Estimated Blood Loss:     Estimated blood loss was minimal. Impression:               - Diverticulosis in the sigmoid colon, in the                            descending colon and in the transverse colon.                           - Normal mucosa in the entire examined colon.                            Biopsied.                           - The distal rectum and anal verge are normal on                             retroflexion view.                           - The GI Genius (intelligent endoscopy module),                            computer-aided polyp detection system powered by AI                            was utilized to detect colorectal polyps through                            enhanced visualization during colonoscopy. Recommendation:           - Patient has a contact number available for                            emergencies. The signs and  symptoms of potential                            delayed complications were discussed with the                            patient. Return to normal activities tomorrow.                            Written discharge instructions were provided to the                            patient.                           - Resume previous diet.                           - Continue present medications.                           - Await pathology results.                           - Repeat colonoscopy in 10 years for screening                            purposes. Doristine Locks, MD 11/25/2022 2:12:38 PM

## 2022-11-25 NOTE — Progress Notes (Signed)
Uneventful anesthetic. Report to pacu rn. Vss. Care resumed by rn. 

## 2022-11-25 NOTE — Patient Instructions (Addendum)
Resume previous diet Continue present medications--BEGIN Protonix 40 mg bid for 8 weeks then take once daily thereafter. Await pathology results There were no colon polyps seen today!  You will need another screening colonoscopy in 10 years, you will receive a letter at that time when you are due for the procedure.    Please call us at [phone] if you have a change in bowel habits, change in family history of colo-rectal cancer, rectal bleeding or other GI concern before that time. Handouts/information given for gastritis, esophagitis, esophageal dilation, post dilation diet, diverticulosis  YOU HAD AN ENDOSCOPIC PROCEDURE TODAY AT THE [facility]:   Refer to the procedure report that was given to you for any specific questions about what was found during the examination.  If the procedure report does not answer your questions, please call your gastroenterologist to clarify.  If you requested that your care partner not be given the details of your procedure findings, then the procedure report has been included in a sealed envelope for you to review at your convenience later.  YOU SHOULD EXPECT: Some feelings of bloating in the abdomen. Passage of more gas than usual.  Walking can help get rid of the air that was put into your GI tract during the procedure and reduce the bloating. If you had a lower endoscopy (such as a colonoscopy or flexible sigmoidoscopy) you may notice spotting of blood in your stool or on the toilet paper. If you underwent a bowel prep for your procedure, you may not have a normal bowel movement for a few days.  Please Note:  You might notice some irritation and congestion in your nose or some drainage.  This is from the oxygen used during your procedure.  There is no need for concern and it should clear up in a day or so.  SYMPTOMS TO REPORT IMMEDIATELY:  Following lower endoscopy (colonoscopy):  Excessive amounts of blood in the stool  Significant tenderness  or worsening of abdominal pains  Swelling of the abdomen that is new, acute  Fever of 100F or higher  Following upper endoscopy (EGD)  Vomiting of blood or coffee ground material  New chest pain or pain under the shoulder blades  Painful or persistently difficult swallowing  New shortness of breath  Black, tarry-looking stools  For urgent or emergent issues, a gastroenterologist can be reached at any hour by calling [phone]. Do not use MyChart messaging for urgent concerns.   DIET:  SEE POST DILATION DIET HANDOUT.  We do recommend a small meal at first, soft foods, but then TOMORROW you may proceed to your regular diet.  Drink plenty of fluids but you should avoid alcoholic beverages for 24 hours.  ACTIVITY:  You should plan to take it easy for the rest of today and you should NOT DRIVE or use heavy machinery until tomorrow (because of the sedation medicines used during the test).    FOLLOW UP: Our staff will call the number listed on your records the next business day following your procedure.  We will call around 7:15- 8:00 am to check on you and address any questions or concerns that you may have regarding the information given to you following your procedure. If we do not reach you, we will leave a message.     If any biopsies were taken you will be contacted by phone or by letter within the next 1-3 weeks.  Please call us at [phone] if you have not heard  about the biopsies in 3 weeks.    SIGNATURES/CONFIDENTIALITY: You and/or your care partner have signed paperwork which will be entered into your electronic medical record.  These signatures attest to the fact that that the information above on your After Visit Summary has been reviewed and is understood.  Full responsibility of the confidentiality of this discharge information lies with you and/or your care-partner.

## 2022-11-25 NOTE — Op Note (Signed)
[facility] Patient Name: Melissa Atkinson Procedure Date: 11/25/2022 1:15 PM MRN: [id number] Endoscopist: Doristine Locks , MD, [id number] Age: 52 Referring MD:  Date of Birth: 1970/07/02 Gender: Female Account #: [id number] Procedure:                Upper GI endoscopy Indications:              Dysphagia Medicines:                Monitored Anesthesia Care Procedure:                Pre-Anesthesia Assessment:                           - Prior to the procedure, a History and Physical                            was performed, and patient medications and                            allergies were reviewed. The patient's tolerance of                            previous anesthesia was also reviewed. The risks                            and benefits of the procedure and the sedation                            options and risks were discussed with the patient.                            All questions were answered, and informed consent                            was obtained. Prior Anticoagulants: The patient has                            taken no anticoagulant or antiplatelet agents. ASA                            Grade Assessment: II - A patient with mild systemic                            disease. After reviewing the risks and benefits,                            the patient was deemed in satisfactory condition to                            undergo the procedure.                           After obtaining informed consent, the endoscope was  passed under direct vision. Throughout the                            procedure, the patient's blood pressure, pulse, and                            oxygen saturations were monitored continuously. The                            Olympus scope [id number] was introduced through the                            mouth, and advanced to the second part of duodenum.                            The upper GI endoscopy was accomplished  without                            difficulty. The patient tolerated the procedure                            well. Scope In: Scope Out: Findings:                 Mucosal changes including longitudinal furrows and                            white plaques were found in the entire esophagus.                            Esophageal findings were graded using the                            Eosinophilic Esophagitis Endoscopic Reference Score                            (EoE-EREFS) as: Edema Grade 0 Normal (distinct                            vascular markings), Rings Grade 0 None (no ridges                            or rings seen), Exudates Grade 1 Mild (scattered                            white lesions involving less than 10 percent of the                            esophageal surface area), Furrows Grade 1 Mild                            (vertical lines without visible depth) and  Stricture none (no stricture found). A guidewire                            was placed and the scope was withdrawn. Dilation                            was performed with a Savary dilator with mild                            resistance at 17 mm. The dilation site was examined                            following endoscope reinsertion and showed mild,                            appropriate mucosal disruption in the lower                            esophagus. Biopsies were obtained from the proximal                            and distal esophagus with cold forceps for                            histology of suspected eosinophilic esophagitis.                            Estimated blood loss was minimal.                           LA Grade B (one or more mucosal breaks greater than                            5 mm, not extending between the tops of two mucosal                            folds) esophagitis with no bleeding was found in                            the lower third of the esophagus.                            Patchy mild inflammation characterized by                            congestion (edema) and erythema was found in the                            gastric fundus, in the gastric body and in the                            gastric antrum. Biopsies were taken with a cold  forceps for histology. Estimated blood loss was                            minimal.                           The examined duodenum was normal. Complications:            No immediate complications. Estimated Blood Loss:     Estimated blood loss was minimal. Impression:               - Esophageal mucosal changes suggestive of                            eosinophilic esophagitis. Dilated with 17 mm Savary                            dilator with an appropriate mucosal disruption in                            the lower esophagus, consistent with successful                            dilation. Biopsies were then taken with a cold                            forceps for evaluation of eosinophilic esophagitis.                           - LA Grade B esophagitis with no bleeding.                           - Mild, patchy, non-ulcer gastritis. Biopsied.                           - Normal examined duodenum. Recommendation:           - Patient has a contact number available for                            emergencies. The signs and symptoms of potential                            delayed complications were discussed with the                            patient. Return to normal activities tomorrow.                            Written discharge instructions were provided to the                            patient.                           - Soft diet today, then slowly advance as tolerated  tomorrow.                           - Continue present medications.                           - Await pathology results.                           - Use Protonix (pantoprazole) 40  mg PO BID for 8                            weeks to promote mucosal healing, then reduce to 40                            mg daily.                           - Will discuss role and timing of repeat endoscopy                            depending on pathology results and response to                            today's intervention.                           - Colonoscopy today. Doristine Locks, MD 11/25/2022 2:09:50 PM

## 2022-11-25 NOTE — Progress Notes (Signed)
Called to room to assist during endoscopic procedure.  Patient ID and intended procedure confirmed with present staff. Received instructions for my participation in the procedure from the performing physician.  

## 2022-11-25 NOTE — Progress Notes (Signed)
GASTROENTEROLOGY PROCEDURE H&P NOTE   Primary Care Physician: Monica Becton, MD    Reason for Procedure:  Dysphagia, colon cancer screening, family history of colon cancer  Plan:    EGD, colonoscopy  Patient is appropriate for endoscopic procedure(s) in the ambulatory ([facility]) setting.  The nature of the procedure, as well as the risks, benefits, and alternatives were carefully and thoroughly reviewed with the patient. Ample time for discussion and questions allowed. The patient understood, was satisfied, and agreed to proceed.     HPI: Melissa Atkinson is a 52 y.o. female who presents for EGD for evaluation of intermittent solid food dysphagia along with colonoscopy for routine CRC screening and family history of colon cancer (maternal grandmother).  Was last seen by me in the office on 10/14/2022.  No changes in clinical history since that appointment.  Past Medical History:  Diagnosis Date   Anxiety    Back pain    Bicuspid aortic valve    Breast cancer (HCC)    Cholelithiasis 01/2020   COVID-19 07/15/2020   Depression    Hospital discharge follow-up 02/25/2020   Joint pain    Kidney cyst, acquired    Obesity    Palpitations    Polycythemia vera (HCC)    PONV (postoperative nausea and vomiting)    Oct 31 2019 hernia with out problems   Right flank pain 07/25/2019   Thoracic aortic aneurysm (HCC)    Thyroid nodule    Ventral hernia without obstruction or gangrene 09/20/2019   Formatting of this note might be different from the original. Added automatically from request for surgery [id number]    Past Surgical History:  Procedure Laterality Date   BILIARY DILATION  02/14/2020   Procedure: BILIARY DILATION;  Surgeon: Kerin Salen, MD;  Location: [facility];  Service: Gastroenterology;;   John Giovanni & CURRETTAGE/HYSTROSCOPY WITH THERMACHOICE ABLATION N/A 12/28/2012   Procedure: DILATATION & CURETTAGE/HYSTEROSCOPY WITH THERMACHOICE ABLATION;  Surgeon: Jeani Hawking,  MD;  Location: [facility];  Service: Gynecology;  Laterality: N/A;   ERCP N/A 02/14/2020   Procedure: ENDOSCOPIC RETROGRADE CHOLANGIOPANCREATOGRAPHY (ERCP);  Surgeon: Kerin Salen, MD;  Location: [facility];  Service: Gastroenterology;  Laterality: N/A;   LAPAROSCOPIC CHOLECYSTECTOMY SINGLE PORT N/A 02/13/2020   Procedure: LAPAROSCOPIC CHOLECYSTECTOMY, CHOLANGIOGRAM, LIVER BIOPSY;  Surgeon: Karie Soda, MD;  Location: [facility];  Service: General;  Laterality: N/A;   MASTECTOMY     bilateral   PARTIAL NEPHRECTOMY     RT    SPHINCTEROTOMY  02/14/2020   Procedure: SPHINCTEROTOMY;  Surgeon: Kerin Salen, MD;  Location: [facility];  Service: Gastroenterology;;   TONSILLECTOMY     TUBAL LIGATION      Prior to Admission medications   Medication Sig Start Date End Date Taking? Authorizing Provider  hydrOXYzine (ATARAX) 50 MG tablet Take 1 tablet (50 mg total) by mouth at bedtime and may repeat dose one time if needed for itching 11/18/22  Yes Monica Becton, MD  losartan (COZAAR) 25 MG tablet Take 1 tablet (25 mg total) by mouth daily. 07/30/22  Yes Lewayne Bunting, MD  LORazepam (ATIVAN) 0.5 MG tablet Take 1 tablet (0.5 mg total) by mouth 2 (two) times daily as needed for anxiety. 11/18/22   Monica Becton, MD    Current Outpatient Medications  Medication Sig Dispense Refill   hydrOXYzine (ATARAX) 50 MG tablet Take 1 tablet (50 mg total) by mouth at bedtime and may repeat dose one time if needed for itching 30 tablet 0  losartan (COZAAR) 25 MG tablet Take 1 tablet (25 mg total) by mouth daily. 90 tablet 3   LORazepam (ATIVAN) 0.5 MG tablet Take 1 tablet (0.5 mg total) by mouth 2 (two) times daily as needed for anxiety. 30 tablet 0   Current Facility-Administered Medications  Medication Dose Route Frequency Provider Last Rate Last Admin   0.9 %  sodium chloride infusion  500 mL Intravenous Once Jerman Tinnon V, DO        Allergies as of 11/25/2022   (No Known Allergies)     Family History  Problem Relation Age of Onset   Cancer Mother        lung   Depression Mother    Anxiety disorder Mother    Bipolar disorder Mother    Alcoholism Mother    COPD Father    Congestive Heart Failure Father    Depression Father    Alcoholism Father    Stroke Maternal Grandfather    Colon cancer Neg Hx    Stomach cancer Neg Hx    Esophageal cancer Neg Hx     Social History   Socioeconomic History   Marital status: Married    Spouse name: Roe Coombs   Number of children: 3   Years of education: Not on file   Highest education level: Not on file  Occupational History   Occupation: Teacher, 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[employer]   Occupation: RN  Tobacco Use   Smoking status: Never   Smokeless tobacco: Never  Vaping Use   Vaping Use: Never used  Substance and Sexual Activity   Alcohol use: Yes    Comment: Occasional   Drug use: No   Sexual activity: Not on file  Other Topics Concern   Not on file  Social History Narrative   Not on file   Social Determinants of Health   Financial Resource Strain: Not on file  Food Insecurity: Not on file  Transportation Needs: Not on file  Physical Activity: Not on file  Stress: Not on file  Social Connections: Not on file  Intimate Partner Violence: Not on file    Physical Exam: Vital signs in last 24 hours: @BP  121/80   Pulse 79   Temp 98.6 F (37 C) (Temporal)   Ht 5\' 5"  (1.651 m)   Wt 208 lb (94.3 kg)   SpO2 97%   BMI 34.61 kg/m  GEN: NAD EYE: Sclerae anicteric ENT: MMM CV: Non-tachycardic Pulm: CTA b/l GI: Soft, NT/ND NEURO:  Alert & Oriented x 3   Doristine Locks, DO [facility]   11/25/2022 12:57 PM

## 2022-11-26 ENCOUNTER — Encounter: Payer: Self-pay | Admitting: Cardiology

## 2022-11-26 ENCOUNTER — Telehealth: Payer: Self-pay

## 2022-11-26 DIAGNOSIS — I712 Thoracic aortic aneurysm, without rupture, unspecified: Secondary | ICD-10-CM

## 2022-11-26 NOTE — Telephone Encounter (Signed)
Attempted to reach patient for post-procedure f/u call. No answer. Left message for her to please not hesitate to call if she has any questions/concerns regarding her care. 

## 2022-12-15 ENCOUNTER — Other Ambulatory Visit (HOSPITAL_COMMUNITY): Payer: Self-pay

## 2022-12-15 DIAGNOSIS — R635 Abnormal weight gain: Secondary | ICD-10-CM | POA: Diagnosis not present

## 2022-12-15 DIAGNOSIS — Z01419 Encounter for gynecological examination (general) (routine) without abnormal findings: Secondary | ICD-10-CM | POA: Diagnosis not present

## 2022-12-15 DIAGNOSIS — Z6835 Body mass index (BMI) 35.0-35.9, adult: Secondary | ICD-10-CM | POA: Diagnosis not present

## 2022-12-15 DIAGNOSIS — N951 Menopausal and female climacteric states: Secondary | ICD-10-CM | POA: Diagnosis not present

## 2022-12-15 DIAGNOSIS — F909 Attention-deficit hyperactivity disorder, unspecified type: Secondary | ICD-10-CM | POA: Diagnosis not present

## 2022-12-15 MED ORDER — AMPHETAMINE-DEXTROAMPHETAMINE 10 MG PO TABS
10.0000 mg | ORAL_TABLET | Freq: Every day | ORAL | 0 refills | Status: DC
  Filled 2022-12-15: qty 30, 30d supply, fill #0

## 2022-12-17 ENCOUNTER — Other Ambulatory Visit (HOSPITAL_COMMUNITY): Payer: Self-pay

## 2022-12-17 MED ORDER — LEVOTHYROXINE SODIUM 25 MCG PO TABS
25.0000 ug | ORAL_TABLET | Freq: Every day | ORAL | 1 refills | Status: DC
  Filled 2022-12-17: qty 30, 30d supply, fill #0
  Filled 2023-01-11: qty 30, 30d supply, fill #1

## 2023-01-07 ENCOUNTER — Encounter: Payer: Self-pay | Admitting: Cardiology

## 2023-01-10 ENCOUNTER — Other Ambulatory Visit: Payer: Self-pay | Admitting: Sports Medicine

## 2023-01-10 ENCOUNTER — Ambulatory Visit
Admission: RE | Admit: 2023-01-10 | Discharge: 2023-01-10 | Disposition: A | Discharge status: Home / Self Care | Payer: 59 | Source: Ambulatory Visit | Attending: Cardiology | Admitting: Cardiology

## 2023-01-10 ENCOUNTER — Other Ambulatory Visit (HOSPITAL_COMMUNITY): Payer: Self-pay

## 2023-01-10 DIAGNOSIS — I712 Thoracic aortic aneurysm, without rupture, unspecified: Secondary | ICD-10-CM

## 2023-01-10 DIAGNOSIS — I77810 Thoracic aortic ectasia: Secondary | ICD-10-CM | POA: Diagnosis not present

## 2023-01-10 MED ORDER — AMPHETAMINE-DEXTROAMPHETAMINE 10 MG PO TABS
10.0000 mg | ORAL_TABLET | Freq: Every day | ORAL | 0 refills | Status: DC
  Filled 2023-01-10 – 2023-01-12 (×3): qty 30, 30d supply, fill #0

## 2023-01-10 MED ORDER — GADOPICLENOL 0.5 MMOL/ML IV SOLN
9.0000 mL | Freq: Once | INTRAVENOUS | Status: AC | PRN
  Administered 2023-01-10: 9 mL via INTRAVENOUS

## 2023-01-11 ENCOUNTER — Other Ambulatory Visit (HOSPITAL_COMMUNITY): Payer: Self-pay

## 2023-01-11 ENCOUNTER — Other Ambulatory Visit: Payer: Self-pay

## 2023-01-11 MED ORDER — AMPHETAMINE-DEXTROAMPHETAMINE 10 MG PO TABS: 10.0000 mg | ORAL_TABLET | Freq: Every day | ORAL | 0 refills | Status: DC

## 2023-01-12 ENCOUNTER — Other Ambulatory Visit (HOSPITAL_COMMUNITY): Payer: Self-pay

## 2023-01-14 ENCOUNTER — Other Ambulatory Visit (HOSPITAL_COMMUNITY): Payer: Self-pay

## 2023-01-14 MED ORDER — HYDROXYZINE HCL 50 MG PO TABS
50.0000 mg | ORAL_TABLET | Freq: Every evening | ORAL | 0 refills | Status: DC | PRN
  Filled 2023-01-14 – 2023-01-27 (×2): qty 30, 15d supply, fill #0

## 2023-01-17 ENCOUNTER — Encounter: Payer: Self-pay | Admitting: *Deleted

## 2023-01-25 ENCOUNTER — Other Ambulatory Visit (HOSPITAL_COMMUNITY): Payer: Self-pay

## 2023-01-27 ENCOUNTER — Other Ambulatory Visit (HOSPITAL_COMMUNITY): Payer: Self-pay

## 2023-01-31 ENCOUNTER — Other Ambulatory Visit (HOSPITAL_COMMUNITY): Payer: Self-pay

## 2023-02-09 ENCOUNTER — Ambulatory Visit (INDEPENDENT_AMBULATORY_CARE_PROVIDER_SITE_OTHER): Payer: 59 | Admitting: Sports Medicine

## 2023-02-09 ENCOUNTER — Other Ambulatory Visit (HOSPITAL_COMMUNITY): Payer: Self-pay

## 2023-02-09 ENCOUNTER — Encounter: Payer: Self-pay | Admitting: Sports Medicine

## 2023-02-09 VITALS — BP 135/88 | HR 87 | Ht 65.0 in | Wt 211.0 lb

## 2023-02-09 DIAGNOSIS — Z6832 Body mass index (BMI) 32.0-32.9, adult: Secondary | ICD-10-CM

## 2023-02-09 DIAGNOSIS — E6609 Other obesity due to excess calories: Secondary | ICD-10-CM

## 2023-02-09 DIAGNOSIS — Z0000 Encounter for general adult medical examination without abnormal findings: Secondary | ICD-10-CM | POA: Diagnosis not present

## 2023-02-09 DIAGNOSIS — E66811 Obesity, class 1: Secondary | ICD-10-CM

## 2023-02-09 MED ORDER — AMPHETAMINE-DEXTROAMPHET ER 15 MG PO CP24
15.0000 mg | ORAL_CAPSULE | ORAL | 0 refills | Status: DC
Start: 2023-02-09 — End: 2023-03-09
  Filled 2023-02-09: qty 30, 30d supply, fill #0

## 2023-02-09 MED ORDER — TIRZEPATIDE 10 MG/0.5ML ~~LOC~~ SOAJ: SUBCUTANEOUS | 11 refills | Status: DC

## 2023-02-09 NOTE — Assessment & Plan Note (Signed)
Has not responded well to multiple GLP-1's, we will go ahead and try compound tirzepatide. She was having some difficulty focusing, difficulty losing weight, her gynecologist did start her on Adderall immediate release, we will switch her to extended release, I am happy to continue this for the time being. Return to see me after 1 month of tirzepatide and we will reevaluate whether to continue.

## 2023-02-09 NOTE — Assessment & Plan Note (Signed)
Annual physical as above, up-to-date on screening measures, we will check routine labs which she will come back in 6 weeks for this. She was started on levothyroxine 25 mcg per her gynecologist, on review of her labs obtained by gynecology her TSH was normal, T3 and T4 were all normal. She is not truly hypothyroid, nor is she subclinical hypothyroidism so we need to stop her levothyroxine. Will recheck TSH, T3, T4 in 6 weeks.

## 2023-02-09 NOTE — Progress Notes (Signed)
Subjective:    CC: Annual Physical Exam  HPI:  This patient is here for their annual physical  I reviewed the past medical history, family history, social history, surgical history, and allergies today and no changes were needed.  Please see the problem list section below in epic for further details.  Past Medical History: Past Medical History:  Diagnosis Date   Anxiety    Back pain    Bicuspid aortic valve    Breast cancer (HCC)    Cholelithiasis 01/2020   COVID-19 07/15/2020   Depression    Hospital discharge follow-up 02/25/2020   Joint pain    Kidney cyst, acquired    Obesity    Palpitations    Polycythemia vera (HCC)    PONV (postoperative nausea and vomiting)    Oct 31 2019 hernia with out problems   Right flank pain 07/25/2019   Thoracic aortic aneurysm (HCC)    Thyroid nodule    Ventral hernia without obstruction or gangrene 09/20/2019   Formatting of this note might be different from the original. Added automatically from request for surgery [id number]   Past Surgical History: Past Surgical History:  Procedure Laterality Date   BILIARY DILATION  02/14/2020   Procedure: BILIARY DILATION;  Surgeon: Kerin Salen, MD;  Location: [facility];  Service: Gastroenterology;;   John Giovanni & CURRETTAGE/HYSTROSCOPY WITH THERMACHOICE ABLATION N/A 12/28/2012   Procedure: DILATATION & CURETTAGE/HYSTEROSCOPY WITH THERMACHOICE ABLATION;  Surgeon: Jeani Hawking, MD;  Location: [facility];  Service: Gynecology;  Laterality: N/A;   ERCP N/A 02/14/2020   Procedure: ENDOSCOPIC RETROGRADE CHOLANGIOPANCREATOGRAPHY (ERCP);  Surgeon: Kerin Salen, MD;  Location: [facility];  Service: Gastroenterology;  Laterality: N/A;   LAPAROSCOPIC CHOLECYSTECTOMY SINGLE PORT N/A 02/13/2020   Procedure: LAPAROSCOPIC CHOLECYSTECTOMY, CHOLANGIOGRAM, LIVER BIOPSY;  Surgeon: Karie Soda, MD;  Location: [facility];  Service: General;  Laterality: N/A;   MASTECTOMY     bilateral   PARTIAL NEPHRECTOMY     RT     SPHINCTEROTOMY  02/14/2020   Procedure: SPHINCTEROTOMY;  Surgeon: Kerin Salen, MD;  Location: [facility];  Service: Gastroenterology;;   TONSILLECTOMY     TUBAL LIGATION     Social History: Social History   Socioeconomic History   Marital status: Married    Spouse name: Don   Number of children: 3   Years of education: Not on file   Highest education level: Not on file  Occupational History   Occupation: Teacher, 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[employer]   Occupation: RN  Tobacco Use   Smoking status: Never   Smokeless tobacco: Never  Vaping Use   Vaping status: Never Used  Substance and Sexual Activity   Alcohol use: Yes    Comment: Occasional   Drug use: No   Sexual activity: Not on file  Other Topics Concern   Not on file  Social History Narrative   Not on file   Social Determinants of Health   Financial Resource Strain: Not on file  Food Insecurity: Not on file  Transportation Needs: Not on file  Physical Activity: Not on file  Stress: Not on file  Social Connections: Unknown (11/06/2021)   Received from [facility], [facility]   Social Network    Social Network: Not on file   Family History: Family History  Problem Relation Age of Onset   Cancer Mother        lung   Depression Mother    Anxiety disorder Mother    Bipolar disorder Mother  Alcoholism Mother    COPD Father    Congestive Heart Failure Father    Depression Father    Alcoholism Father    Stroke Maternal Grandfather    Colon cancer Neg Hx    Stomach cancer Neg Hx    Esophageal cancer Neg Hx    Allergies: No Known Allergies Medications: See med rec.  Review of Systems: No headache, visual changes, nausea, vomiting, diarrhea, constipation, dizziness, abdominal pain, skin rash, fevers, chills, night sweats, swollen lymph nodes, weight loss, chest pain, body aches, joint swelling, muscle aches, shortness of breath, mood changes, visual or auditory hallucinations.  Objective:    General: Well  Developed, well nourished, and in no acute distress.  Neuro: Alert and oriented x3, extra-ocular muscles intact, sensation grossly intact. Cranial nerves II through XII are intact, motor, sensory, and coordinative functions are all intact. HEENT: Normocephalic, atraumatic, pupils equal round reactive to light, neck supple, no masses, no lymphadenopathy, thyroid nonpalpable. Oropharynx, nasopharynx, external ear canals are unremarkable. Skin: Warm and dry, no rashes noted.  Cardiac: Regular rate and rhythm, no murmurs rubs or gallops.  Respiratory: Clear to auscultation bilaterally. Not using accessory muscles, speaking in full sentences.  Abdominal: Soft, nontender, nondistended, positive bowel sounds, no masses, no organomegaly.  Musculoskeletal: Shoulder, elbow, wrist, hip, knee, ankle stable, and with full range of motion.  Impression and Recommendations:    The patient was counselled, risk factors were discussed, anticipatory guidance given.  Annual physical exam Annual physical as above, up-to-date on screening measures, we will check routine labs which she will come back in 6 weeks for this. She was started on levothyroxine 25 mcg per her gynecologist, on review of her labs obtained by gynecology her TSH was normal, T3 and T4 were all normal. She is not truly hypothyroid, nor is she subclinical hypothyroidism so we need to stop her levothyroxine. Will recheck TSH, T3, T4 in 6 weeks.  Class 1 obesity with serious comorbidity and body mass index (BMI) of 32.0 to 32.9 in adult Has not responded well to multiple GLP-1's, we will go ahead and try compound tirzepatide. She was having some difficulty focusing, difficulty losing weight, her gynecologist did start her on Adderall immediate release, we will switch her to extended release, I am happy to continue this for the time being. Return to see me after 1 month of tirzepatide and we will reevaluate whether to  continue.   ____________________________________________ Ihor Austin. Benjamin Stain, M.D., ABFM., CAQSM., AME. Primary Care and Sports Medicine [facility]  Adjunct Professor of Family Medicine  [place] of [facility] [employer]  Restaurant manager, fast food

## 2023-03-09 ENCOUNTER — Other Ambulatory Visit: Payer: Self-pay

## 2023-03-09 ENCOUNTER — Ambulatory Visit (INDEPENDENT_AMBULATORY_CARE_PROVIDER_SITE_OTHER): Payer: 59 | Admitting: Sports Medicine

## 2023-03-09 ENCOUNTER — Encounter: Payer: Self-pay | Admitting: Sports Medicine

## 2023-03-09 ENCOUNTER — Other Ambulatory Visit (HOSPITAL_COMMUNITY): Payer: Self-pay

## 2023-03-09 VITALS — BP 116/81 | HR 73 | Ht 65.0 in | Wt 201.0 lb

## 2023-03-09 DIAGNOSIS — E66811 Obesity, class 1: Secondary | ICD-10-CM

## 2023-03-09 DIAGNOSIS — E6609 Other obesity due to excess calories: Secondary | ICD-10-CM

## 2023-03-09 DIAGNOSIS — F909 Attention-deficit hyperactivity disorder, unspecified type: Secondary | ICD-10-CM | POA: Diagnosis not present

## 2023-03-09 DIAGNOSIS — Z6832 Body mass index (BMI) 32.0-32.9, adult: Secondary | ICD-10-CM | POA: Diagnosis not present

## 2023-03-09 MED ORDER — AMPHETAMINE-DEXTROAMPHET ER 15 MG PO CP24
15.0000 mg | ORAL_CAPSULE | ORAL | 0 refills | Status: DC
Start: 2023-03-09 — End: 2023-06-06
  Filled 2023-03-09 – 2023-03-10 (×2): qty 90, 90d supply, fill #0

## 2023-03-09 NOTE — Progress Notes (Signed)
    Procedures performed today:    None.  Independent interpretation of notes and tests performed by another provider:   None.  Brief History, Exam, Impression, and Recommendations:    Class 1 obesity with serious comorbidity and body mass index (BMI) of 32.0 to 32.9 in adult 10 pound weight loss after the first month on compounded tirzepatide. She feels significantly better, we will put this on autopilot for now.  Adult ADHD Doing well, we switched her from immediate release to extended release Adderall, doing extremely well, refilling.    ____________________________________________ Ihor Austin. Benjamin Stain, M.D., ABFM., CAQSM., AME. Primary Care and Sports Medicine [facility]  Adjunct Professor of Family Medicine  [place] [employer] [facility] [employer] [facility] [employer]  Restaurant manager, fast food

## 2023-03-09 NOTE — Assessment & Plan Note (Signed)
10 pound weight loss after the first month on compounded tirzepatide. She feels significantly better, we will put this on autopilot for now.

## 2023-03-09 NOTE — Assessment & Plan Note (Signed)
Doing well, we switched her from immediate release to extended release Adderall, doing extremely well, refilling.

## 2023-03-10 ENCOUNTER — Other Ambulatory Visit (HOSPITAL_COMMUNITY): Payer: Self-pay

## 2023-03-10 ENCOUNTER — Other Ambulatory Visit: Payer: Self-pay | Admitting: Sports Medicine

## 2023-03-10 ENCOUNTER — Other Ambulatory Visit: Payer: Self-pay

## 2023-03-10 DIAGNOSIS — F430 Acute stress reaction: Secondary | ICD-10-CM

## 2023-03-10 LAB — T3, FREE: T3, Free: 2.9 pg/mL (ref 2.0–4.4)

## 2023-03-10 LAB — CBC
Hematocrit: 43.2 % (ref 34.0–46.6)
Hemoglobin: 13.6 g/dL (ref 11.1–15.9)
MCH: 28 pg (ref 26.6–33.0)
MCHC: 31.5 g/dL (ref 31.5–35.7)
MCV: 89 fL (ref 79–97)
Platelets: 147 10*3/uL — ABNORMAL LOW (ref 150–450)
RBC: 4.86 x10E6/uL (ref 3.77–5.28)
RDW: 13.5 % (ref 11.7–15.4)
WBC: 5.4 10*3/uL (ref 3.4–10.8)

## 2023-03-10 LAB — COMPREHENSIVE METABOLIC PANEL
ALT: 18 IU/L (ref 0–32)
AST: 15 IU/L (ref 0–40)
Albumin: 4.4 g/dL (ref 3.8–4.9)
Alkaline Phosphatase: 107 IU/L (ref 44–121)
BUN/Creatinine Ratio: 20 (ref 9–23)
BUN: 17 mg/dL (ref 6–24)
Bilirubin Total: 0.4 mg/dL (ref 0.0–1.2)
CO2: 22 mmol/L (ref 20–29)
Calcium: 9.6 mg/dL (ref 8.7–10.2)
Chloride: 104 mmol/L (ref 96–106)
Creatinine, Ser: 0.85 mg/dL (ref 0.57–1.00)
Globulin, Total: 2.4 g/dL (ref 1.5–4.5)
Glucose: 77 mg/dL (ref 70–99)
Potassium: 4.3 mmol/L (ref 3.5–5.2)
Sodium: 141 mmol/L (ref 134–144)
Total Protein: 6.8 g/dL (ref 6.0–8.5)
eGFR: 82 mL/min/{1.73_m2} (ref >59)

## 2023-03-10 LAB — LIPID PANEL
Chol/HDL Ratio: 2.3 ratio (ref 0.0–4.4)
Cholesterol, Total: 209 mg/dL — ABNORMAL HIGH (ref 100–199)
HDL: 89 mg/dL (ref >39)
LDL Chol Calc (NIH): 106 mg/dL — ABNORMAL HIGH (ref 0–99)
Triglycerides: 80 mg/dL (ref 0–149)
VLDL Cholesterol Cal: 14 mg/dL (ref 5–40)

## 2023-03-10 LAB — TSH: TSH: 0.993 u[IU]/mL (ref 0.450–4.500)

## 2023-03-10 LAB — HEMOGLOBIN A1C
Est. average glucose Bld gHb Est-mCnc: 117 mg/dL
Hgb A1c MFr Bld: 5.7 % — ABNORMAL HIGH (ref 4.8–5.6)

## 2023-03-10 LAB — T4, FREE: Free T4: 1.26 ng/dL (ref 0.82–1.77)

## 2023-03-10 MED ORDER — LORAZEPAM 0.5 MG PO TABS
0.5000 mg | ORAL_TABLET | Freq: Two times a day (BID) | ORAL | 0 refills | Status: AC | PRN
Start: 2023-03-10 — End: ?
  Filled 2023-03-10: qty 30, 15d supply, fill #0

## 2023-03-10 MED ORDER — HYDROXYZINE HCL 50 MG PO TABS
50.0000 mg | ORAL_TABLET | Freq: Every evening | ORAL | 0 refills | Status: DC | PRN
  Filled 2023-03-10: qty 30, 15d supply, fill #0

## 2023-03-11 ENCOUNTER — Other Ambulatory Visit (HOSPITAL_COMMUNITY): Payer: Self-pay

## 2023-04-07 ENCOUNTER — Other Ambulatory Visit (HOSPITAL_COMMUNITY): Payer: Self-pay

## 2023-06-06 ENCOUNTER — Other Ambulatory Visit: Payer: Self-pay

## 2023-06-06 ENCOUNTER — Other Ambulatory Visit: Payer: Self-pay | Admitting: Sports Medicine

## 2023-06-06 ENCOUNTER — Other Ambulatory Visit (HOSPITAL_COMMUNITY): Payer: Self-pay

## 2023-06-06 DIAGNOSIS — E66811 Obesity, class 1: Secondary | ICD-10-CM

## 2023-06-06 MED ORDER — HYDROXYZINE HCL 50 MG PO TABS
50.0000 mg | ORAL_TABLET | Freq: Every evening | ORAL | 0 refills | Status: DC | PRN
  Filled 2023-06-06: qty 30, 15d supply, fill #0

## 2023-06-06 MED ORDER — AMPHETAMINE-DEXTROAMPHET ER 15 MG PO CP24
15.0000 mg | ORAL_CAPSULE | ORAL | 0 refills | Status: DC
Start: 2023-06-06 — End: 2023-09-04
  Filled 2023-06-06 – 2023-06-07 (×2): qty 90, 90d supply, fill #0

## 2023-06-07 ENCOUNTER — Other Ambulatory Visit (HOSPITAL_COMMUNITY): Payer: Self-pay

## 2023-06-07 ENCOUNTER — Other Ambulatory Visit: Payer: Self-pay

## 2023-07-11 ENCOUNTER — Encounter: Payer: Self-pay | Admitting: Sports Medicine

## 2023-07-30 NOTE — Progress Notes (Unsigned)
HPI: FU bicuspid aortic valve. Stress echocardiogram in March of 2013 showed normal LV function, probable bicuspid aortic valve and no stress-induced wall motion abnormalities. Monitor in March of 2013 showed sinus rhythm with PACs and short bursts of SVT but no sustained arrhythmias. MRA of cerebral vasculature February 2016 showed no aneurysm.  Monitor January 2020 showed sinus rhythm, sinus tachycardia, ectopic atrial rhythm, brief ectopic atrial tachycardia and 7 beats nonsustained ventricular tachycardia. Renal Dopplers March 2020 showed no renal artery stenosis. Echocardiogram March 2023 showed normal LV function.  MRA July 2024 showed ectasia of the ascending thoracic aorta with follow-up recommended in 2 years.  Since she was last seen she denies dyspnea, chest pain, palpitations or syncope.  She has lost 35 pounds.  Current Outpatient Medications  Medication Sig Dispense Refill   amphetamine-dextroamphetamine (ADDERALL XR) 15 MG 24 hr capsule Take 1 capsule by mouth every morning. 90 capsule 0   hydrOXYzine (ATARAX) 50 MG tablet Take 1 tablet (50 mg total) by mouth at bedtime and may repeat dose one time if needed for itching 30 tablet 0   LORazepam (ATIVAN) 0.5 MG tablet Take 1 tablet (0.5 mg total) by mouth 2 (two) times daily as needed for anxiety. 30 tablet 0   losartan (COZAAR) 25 MG tablet Take 1 tablet (25 mg total) by mouth daily. 90 tablet 3   pantoprazole (PROTONIX) 40 MG tablet Take 1 tablet (40 mg total) by mouth daily. 90 tablet 3   tirzepatide [facility]) 10 MG/0.5ML Pen Tirzepatide 10mg /Pyridoxone 10mg /mL injection.  Inject 2.5 mg/25 units subcu weekly for 4 weeks then 5 mg/50 units subcu weekly for 4 weeks then 7.5 mg/75 units subcu weekly for 4 weeks then 10 mg/100 units subcu weekly for 4 weeks then 15 mg/150 units subcu weekly 3 mL 11   No current facility-administered medications for this visit.     Past Medical History:  Diagnosis Date   Anxiety    Back pain     Bicuspid aortic valve    Breast cancer (HCC)    Cholelithiasis 01/2020   COVID-19 07/15/2020   Depression    Hospital discharge follow-up 02/25/2020   Joint pain    Kidney cyst, acquired    Obesity    Palpitations    Polycythemia vera (HCC)    PONV (postoperative nausea and vomiting)    Oct 31 2019 hernia with out problems   Right flank pain 07/25/2019   Thoracic aortic aneurysm (HCC)    Thyroid nodule    Ventral hernia without obstruction or gangrene 09/20/2019   Formatting of this note might be different from the original. Added automatically from request for surgery [id number]    Past Surgical History:  Procedure Laterality Date   BILIARY DILATION  02/14/2020   Procedure: BILIARY DILATION;  Surgeon: Kerin Salen, MD;  Location: [facility];  Service: Gastroenterology;;   John Giovanni & CURRETTAGE/HYSTROSCOPY WITH THERMACHOICE ABLATION N/A 12/28/2012   Procedure: DILATATION & CURETTAGE/HYSTEROSCOPY WITH THERMACHOICE ABLATION;  Surgeon: Jeani Hawking, MD;  Location: [facility];  Service: Gynecology;  Laterality: N/A;   ERCP N/A 02/14/2020   Procedure: ENDOSCOPIC RETROGRADE CHOLANGIOPANCREATOGRAPHY (ERCP);  Surgeon: Kerin Salen, MD;  Location: [facility];  Service: Gastroenterology;  Laterality: N/A;   LAPAROSCOPIC CHOLECYSTECTOMY SINGLE PORT N/A 02/13/2020   Procedure: LAPAROSCOPIC CHOLECYSTECTOMY, CHOLANGIOGRAM, LIVER BIOPSY;  Surgeon: Karie Soda, MD;  Location: [facility];  Service: General;  Laterality: N/A;   MASTECTOMY     bilateral   PARTIAL NEPHRECTOMY  RT    SPHINCTEROTOMY  02/14/2020   Procedure: SPHINCTEROTOMY;  Surgeon: Kerin Salen, MD;  Location: [facility];  Service: Gastroenterology;;   TONSILLECTOMY     TUBAL LIGATION      Social History   Socioeconomic History   Marital status: Married    Spouse name: Don   Number of children: 3   Years of education: Not on file   Highest education level: Not on file  Occupational History   Occupation: Teacher, 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[employer]   Occupation: RN  Tobacco Use   Smoking status: Never   Smokeless tobacco: Never  Vaping Use   Vaping status: Never Used  Substance and Sexual Activity   Alcohol use: Yes    Comment: Occasional   Drug use: No   Sexual activity: Not on file  Other Topics Concern   Not on file  Social History Narrative   Not on file   Social Drivers of Health   Financial Resource Strain: Not on file  Food Insecurity: Not on file  Transportation Needs: Not on file  Physical Activity: Not on file  Stress: Not on file  Social Connections: Unknown (11/06/2021)   Received from [facility], [facility]   Social Network    Social Network: Not on file  Intimate Partner Violence: Unknown (09/29/2021)   Received from [employer], [facility]   HITS    Physically Hurt: Not on file    Insult or Talk Down To: Not on file    Threaten Physical Harm: Not on file    Scream or Curse: Not on file    Family History  Problem Relation Age of Onset   Cancer Mother        lung   Depression Mother    Anxiety disorder Mother    Bipolar disorder Mother    Alcoholism Mother    COPD Father    Congestive Heart Failure Father    Depression Father    Alcoholism Father    Stroke Maternal Grandfather    Colon cancer Neg Hx    Stomach cancer Neg Hx    Esophageal cancer Neg Hx     ROS: no fevers or chills, productive cough, hemoptysis, dysphasia, odynophagia, melena, hematochezia, dysuria, hematuria, rash, seizure activity, orthopnea, PND, pedal edema, claudication. Remaining systems are negative.  Physical Exam: Well-developed well-nourished in no acute distress.  Skin is warm and dry.  HEENT is normal.  Neck is supple.  Chest is clear to auscultation with normal expansion.  Cardiovascular exam is regular rate and rhythm.  Abdominal exam nontender or distended. No masses palpated. Extremities show no edema. neuro grossly intact  EKG Interpretation Date/Time:  Monday August 01 2023  10:08:18 EST Ventricular Rate:  88 PR Interval:  136 QRS Duration:  76 QT Interval:  354 QTC Calculation: 428 R Axis:   24  Text Interpretation: Normal sinus rhythm Nonspecific ST abnormality No previous ECGs available Confirmed by Olga Millers ([id number]) on 08/01/2023 10:09:47 AM    A/P  1 history of bicuspid aortic valve-no murmur noted on examination and most recent echocardiogram showed no AS or AI.  Will likely repeat echocardiogram July 2026.  2 mildly dilated aortic root-plan follow-up MRA July 2026.  3 hypertension-patient's blood pressure is controlled today.  Continue present medical regimen and follow.  4 palpitations-beta-blocker discontinued previously due to fatigue.  Palpitations are controlled.  Olga Millers, MD

## 2023-08-01 ENCOUNTER — Ambulatory Visit: Payer: Commercial Managed Care - PPO | Admitting: Cardiology

## 2023-08-01 ENCOUNTER — Encounter: Payer: Self-pay | Admitting: Cardiology

## 2023-08-01 VITALS — BP 120/62 | HR 88 | Ht 65.0 in | Wt 177.0 lb

## 2023-08-01 DIAGNOSIS — Q2381 Bicuspid aortic valve: Secondary | ICD-10-CM

## 2023-08-01 DIAGNOSIS — I712 Thoracic aortic aneurysm, without rupture, unspecified: Secondary | ICD-10-CM | POA: Diagnosis not present

## 2023-08-01 DIAGNOSIS — I10 Essential (primary) hypertension: Secondary | ICD-10-CM

## 2023-08-01 DIAGNOSIS — R002 Palpitations: Secondary | ICD-10-CM | POA: Diagnosis not present

## 2023-08-01 NOTE — Patient Instructions (Signed)
    Follow-Up: At [facility], you and your health needs are our priority.  As part of our continuing mission to provide you with exceptional heart care, we have created designated Provider Care Teams.  These Care Teams include your primary Cardiologist (physician) and Advanced Practice Providers (APPs -  Physician Assistants and Nurse Practitioners) who all work together to provide you with the care you need, when you need it.   Your next appointment:   12 month(s)  Provider:   Olga Millers, MD

## 2023-08-29 ENCOUNTER — Other Ambulatory Visit: Payer: Self-pay | Admitting: Sports Medicine

## 2023-09-01 ENCOUNTER — Other Ambulatory Visit (HOSPITAL_COMMUNITY): Payer: Self-pay

## 2023-09-01 MED ORDER — HYDROXYZINE HCL 50 MG PO TABS
50.0000 mg | ORAL_TABLET | Freq: Every evening | ORAL | 0 refills | Status: DC | PRN
  Filled 2023-09-01: qty 30, 15d supply, fill #0

## 2023-09-04 ENCOUNTER — Other Ambulatory Visit: Payer: Self-pay | Admitting: Cardiology

## 2023-09-04 ENCOUNTER — Other Ambulatory Visit: Payer: Self-pay | Admitting: Sports Medicine

## 2023-09-04 DIAGNOSIS — I10 Essential (primary) hypertension: Secondary | ICD-10-CM

## 2023-09-04 DIAGNOSIS — E6609 Other obesity due to excess calories: Secondary | ICD-10-CM

## 2023-09-05 ENCOUNTER — Other Ambulatory Visit (HOSPITAL_COMMUNITY): Payer: Self-pay

## 2023-09-05 ENCOUNTER — Other Ambulatory Visit: Payer: Self-pay

## 2023-09-05 MED ORDER — LOSARTAN POTASSIUM 25 MG PO TABS
25.0000 mg | ORAL_TABLET | Freq: Every day | ORAL | 3 refills | Status: DC
  Filled 2023-09-05: qty 90, 90d supply, fill #0
  Filled 2023-12-05: qty 90, 90d supply, fill #1
  Filled 2024-06-10: qty 90, 90d supply, fill #2

## 2023-09-05 MED ORDER — AMPHETAMINE-DEXTROAMPHET ER 15 MG PO CP24
15.0000 mg | ORAL_CAPSULE | ORAL | 0 refills | Status: DC
Start: 2023-09-05 — End: 2023-12-05
  Filled 2023-09-05: qty 90, 90d supply, fill #0

## 2023-09-06 ENCOUNTER — Other Ambulatory Visit (HOSPITAL_COMMUNITY): Payer: Self-pay

## 2023-10-20 ENCOUNTER — Other Ambulatory Visit: Payer: Self-pay

## 2023-10-20 DIAGNOSIS — E6609 Other obesity due to excess calories: Secondary | ICD-10-CM

## 2023-10-20 MED ORDER — TIRZEPATIDE 10 MG/0.5ML ~~LOC~~ SOAJ
SUBCUTANEOUS | 11 refills | Status: AC
Start: 2023-10-20 — End: ?

## 2023-10-20 NOTE — Telephone Encounter (Signed)
 Refilling and switching to the LipoSlim.

## 2023-12-05 ENCOUNTER — Other Ambulatory Visit: Payer: Self-pay | Admitting: Sports Medicine

## 2023-12-05 DIAGNOSIS — Z6832 Body mass index (BMI) 32.0-32.9, adult: Secondary | ICD-10-CM

## 2023-12-06 ENCOUNTER — Other Ambulatory Visit: Payer: Self-pay

## 2023-12-06 ENCOUNTER — Other Ambulatory Visit (HOSPITAL_COMMUNITY): Payer: Self-pay

## 2023-12-06 MED ORDER — AMPHETAMINE-DEXTROAMPHET ER 15 MG PO CP24
15.0000 mg | ORAL_CAPSULE | Freq: Every day | ORAL | 0 refills | Status: DC
Start: 2023-12-06 — End: 2024-03-13
  Filled 2023-12-06: qty 90, 90d supply, fill #0

## 2023-12-07 DIAGNOSIS — H5203 Hypermetropia, bilateral: Secondary | ICD-10-CM | POA: Diagnosis not present

## 2023-12-08 ENCOUNTER — Other Ambulatory Visit (HOSPITAL_COMMUNITY): Payer: Self-pay

## 2023-12-12 ENCOUNTER — Other Ambulatory Visit (HOSPITAL_COMMUNITY): Payer: Self-pay

## 2024-01-12 ENCOUNTER — Other Ambulatory Visit (HOSPITAL_COMMUNITY): Payer: Self-pay

## 2024-01-12 ENCOUNTER — Other Ambulatory Visit: Payer: Self-pay | Admitting: Sports Medicine

## 2024-01-12 MED ORDER — TRIAMCINOLONE ACETONIDE 0.1 % EX CREA
1.0000 | TOPICAL_CREAM | Freq: Two times a day (BID) | CUTANEOUS | 4 refills | Status: AC
  Filled 2024-01-12: qty 45, 23d supply, fill #0
  Filled 2024-06-10: qty 45, 23d supply, fill #1

## 2024-01-13 ENCOUNTER — Encounter: Payer: Self-pay | Admitting: Sports Medicine

## 2024-01-16 ENCOUNTER — Encounter: Payer: Self-pay | Admitting: Urgent Care

## 2024-01-16 ENCOUNTER — Ambulatory Visit: Admitting: Urgent Care

## 2024-01-16 VITALS — BP 125/74 | HR 95 | Resp 18 | Ht 65.0 in | Wt 163.5 lb

## 2024-01-16 DIAGNOSIS — L309 Dermatitis, unspecified: Secondary | ICD-10-CM | POA: Diagnosis not present

## 2024-01-16 NOTE — Progress Notes (Unsigned)
   Established Patient Office Visit  Subjective:  Patient ID: Melissa Atkinson, female    DOB: 02-Mar-1971  Age: 53 y.o. MRN: [id number]  Chief Complaint  Patient presents with   Rash    Under left armpit x10 days a little painful when air hits. More itchy than anything. She thinks it may be shingles    HPI  {History (Optional):23778}  ROS: as noted in HPI  Objective:     BP 125/74 (BP Location: Left Arm, Patient Position: Sitting, Cuff Size: Large)   Pulse 95   Resp 18   Ht 5' 5 (1.651 m)   Wt 163 lb 8 oz (74.2 kg)   SpO2 100%   BMI 27.21 kg/m  {Vitals History (Optional):23777}  Physical Exam   No results found for any visits on 01/16/24.    {Labs (Optional):23779}  The 10-year ASCVD risk score (Arnett DK, et al., 2019) is: 1.5%  Assessment & Plan:  Dermatitis, unspecified     No follow-ups on file.   Benton LITTIE Gave, PA

## 2024-01-27 ENCOUNTER — Other Ambulatory Visit (HOSPITAL_COMMUNITY): Payer: Self-pay

## 2024-01-27 ENCOUNTER — Other Ambulatory Visit: Payer: Self-pay | Admitting: Sports Medicine

## 2024-01-27 MED ORDER — HYDROXYZINE HCL 50 MG PO TABS
50.0000 mg | ORAL_TABLET | Freq: Every evening | ORAL | 0 refills | Status: DC | PRN
  Filled 2024-01-27: qty 30, 15d supply, fill #0

## 2024-01-27 NOTE — Telephone Encounter (Signed)
 Scheduled

## 2024-01-27 NOTE — Telephone Encounter (Signed)
 Called patient left vm to call back and schedule an physical after August 14th

## 2024-01-28 ENCOUNTER — Telehealth: Admitting: Nurse Practitioner

## 2024-01-28 DIAGNOSIS — M79676 Pain in unspecified toe(s): Secondary | ICD-10-CM

## 2024-01-28 NOTE — Patient Instructions (Signed)
  Melissa Atkinson, thank you for joining Haze LELON Servant, NP for today's virtual visit.  While this provider is not your primary care provider (PCP), if your PCP is located in our provider database this encounter information will be shared with them immediately following your visit.   A [facility] MyChart account gives you access to today's visit and all your visits, tests, and labs performed at [facility]  click here if you don't have a [place] [facility] MyChart account or go to [email or web address]  Consent: (Patient) Melissa Atkinson provided verbal consent for this virtual visit at the beginning of the encounter.  Current Medications:  Current Outpatient Medications:    amphetamine -dextroamphetamine  (ADDERALL XR) 15 MG 24 hr capsule, Take 1 capsule by mouth daily., Disp: 90 capsule, Rfl: 0   hydrOXYzine  (ATARAX ) 50 MG tablet, Take 1 tablet (50 mg total) by mouth at bedtime and may repeat dose one time if needed for itching, Disp: 30 tablet, Rfl: 0   LORazepam  (ATIVAN ) 0.5 MG tablet, Take 1 tablet (0.5 mg total) by mouth 2 (two) times daily as needed for anxiety., Disp: 30 tablet, Rfl: 0   losartan  (COZAAR ) 25 MG tablet, Take 1 tablet (25 mg total) by mouth daily., Disp: 90 tablet, Rfl: 3   pantoprazole  (PROTONIX ) 40 MG tablet, Take 1 tablet (40 mg total) by mouth daily., Disp: 90 tablet, Rfl: 3   tirzepatide  (MOUNJARO ) 10 MG/0.5ML Pen, Liposlim.  Tirzepatide /Pyridoxine/Thiamine/L-Carnitine 10mg /mL.  Inject 10 mg/100 units subcu weekly for 4 weeks then 15 mg/150 units subcu weekly, Disp: 3 mL, Rfl: 11   triamcinolone  cream (KENALOG ) 0.1 %, Apply 1 application topically 2 (two) times daily., Disp: 45 g, Rfl: 4   Medications ordered in this encounter:  No orders of the defined types were placed in this encounter.    *If you need refills on other medications prior to your next appointment, please contact your pharmacy*  Follow-Up: Call back or seek an in-person evaluation if the  symptoms worsen or if the condition fails to improve as anticipated.  [facility] [phone]  Other Instructions Recommend xray ([facility]) RICE therapy    If you have been instructed to have an in-person evaluation today at a local Urgent Care facility, please use the link below. It will take you to a list of all of our available [facility], including address, phone number and hours of operation. Please do not delay care.  [facility]  If you or a family member do not have a primary care provider, use the link below to schedule a visit and establish care. When you choose a [facility] primary care physician or advanced practice provider, you gain a long-term partner in health. Find a Primary Care Provider  Learn more about [facility]'s in-office and virtual care options:  - Get Care Now

## 2024-01-28 NOTE — Progress Notes (Signed)
 Virtual Visit Consent   Melissa Atkinson, you are scheduled for a virtual visit with a [facility] provider today. Just as with appointments in the office, your consent must be obtained to participate. Your consent will be active for this visit and any virtual visit you may have with one of our providers in the next 365 days. If you have a MyChart account, a copy of this consent can be sent to you electronically.  As this is a virtual visit, video technology does not allow for your provider to perform a traditional examination. This may limit your provider's ability to fully assess your condition. If your provider identifies any concerns that need to be evaluated in person or the need to arrange testing (such as labs, EKG, etc.), we will make arrangements to do so. Although advances in technology are sophisticated, we cannot ensure that it will always work on either your end or our end. If the connection with a video visit is poor, the visit may have to be switched to a telephone visit. With either a video or telephone visit, we are not always able to ensure that we have a secure connection.  By engaging in this virtual visit, you consent to the provision of healthcare and authorize for your insurance to be billed (if applicable) for the services provided during this visit. Depending on your insurance coverage, you may receive a charge related to this service.  I need to obtain your verbal consent now. Are you willing to proceed with your visit today? Melissa Atkinson has provided verbal consent on 01/28/2024 for a virtual visit (video or telephone). Haze LELON Servant, NP  Date: 01/28/2024 1:37 PM   Virtual Visit via Video Note   I, Haze LELON Servant, connected with  Melissa Atkinson  ([id number], March 06, 1971) on 01/28/24 at  1:00 PM EDT by a video-enabled telemedicine application and verified that I am speaking with the correct person using two identifiers.  Location: Patient: Virtual Visit Location Patient:  Home Provider: Virtual Visit Location Provider: Home Office   I discussed the limitations of evaluation and management by telemedicine and the availability of in person appointments. The patient expressed understanding and agreed to proceed.    History of Present Illness: Melissa Atkinson is a 53 y.o. who identifies as a female who was assigned female at birth, and is being seen today for toe pain.   Melissa Atkinson hit her 5th toe against a heavy object last week. Since then she has been unable to wear any closed toed shoes without significant pain in the affected toe. She states it feels like a bone is sticking out when she tries to wear anything other than sandals or flip flops. She is able to bear weight.    Problems:  Patient Active Problem List   Diagnosis Date Noted   Adult ADHD 03/09/2023   Acute embolism and thrombosis of superficial vein of right upper extremity 02/25/2020   Post-ERCP acute pancreatitis 02/19/2020   Common bile duct stones s/p ERCP 02/14/2020 02/19/2020   Gallstone pancreatitis 02/15/2020   Incisional suprapubic hernia s/p open repair w mesh 10/2019 02/13/2020   Acute calculous cholecystitis s/p lap cholecystectomy 02/13/2020 02/13/2020   Polycythemia vera (HCC) 05/10/2018   Vitamin D  deficiency 05/17/2017   Class 1 obesity with serious comorbidity and body mass index (BMI) of 32.0 to 32.9 in adult 05/03/2017   Essential hypertension 08/20/2015   Thoracic aortic aneurysm (HCC) 08/20/2015   Major depression, single episode 02/07/2015  Annual physical exam 07/12/2014   Hx of bilateral mastectomy post TRAM flap reconstruction 07/12/2014   Molluscum contagiosum 08/18/2013   History of palpitations 03/09/2013   Bicuspid aortic valve 03/09/2013   Thyroid  nodule 03/09/2013   Breast cancer (HCC) 03/09/2013    Allergies: No Known Allergies Medications:  Current Outpatient Medications:    amphetamine -dextroamphetamine  (ADDERALL XR) 15 MG 24 hr capsule, Take 1 capsule by  mouth daily., Disp: 90 capsule, Rfl: 0   hydrOXYzine  (ATARAX ) 50 MG tablet, Take 1 tablet (50 mg total) by mouth at bedtime and may repeat dose one time if needed for itching, Disp: 30 tablet, Rfl: 0   LORazepam  (ATIVAN ) 0.5 MG tablet, Take 1 tablet (0.5 mg total) by mouth 2 (two) times daily as needed for anxiety., Disp: 30 tablet, Rfl: 0   losartan  (COZAAR ) 25 MG tablet, Take 1 tablet (25 mg total) by mouth daily., Disp: 90 tablet, Rfl: 3   pantoprazole  (PROTONIX ) 40 MG tablet, Take 1 tablet (40 mg total) by mouth daily., Disp: 90 tablet, Rfl: 3   tirzepatide  (MOUNJARO ) 10 MG/0.5ML Pen, Liposlim.  Tirzepatide /Pyridoxine/Thiamine/L-Carnitine 10mg /mL.  Inject 10 mg/100 units subcu weekly for 4 weeks then 15 mg/150 units subcu weekly, Disp: 3 mL, Rfl: 11   triamcinolone  cream (KENALOG ) 0.1 %, Apply 1 application topically 2 (two) times daily., Disp: 45 g, Rfl: 4  Observations/Objective: Patient is well-developed, well-nourished in no acute distress.  Resting comfortably at home.  Head is normocephalic, atraumatic.  No labored breathing.  Speech is clear and coherent with logical content.  Patient is alert and oriented at baseline.     Assessment and Plan: 1. Pain of toe, unspecified laterality (Primary) Recommend xray ([facility]) RICE therapy  Follow Up Instructions: I discussed the assessment and treatment plan with the patient. The patient was provided an opportunity to ask questions and all were answered. The patient agreed with the plan and demonstrated an understanding of the instructions.  A copy of instructions were sent to the patient via MyChart unless otherwise noted below.    The patient was advised to call back or seek an in-person evaluation if the symptoms worsen or if the condition fails to improve as anticipated.    Melissa Archbold W Rufino Staup, NP

## 2024-01-30 ENCOUNTER — Ambulatory Visit (INDEPENDENT_AMBULATORY_CARE_PROVIDER_SITE_OTHER): Admitting: Sports Medicine

## 2024-01-30 ENCOUNTER — Ambulatory Visit: Payer: Self-pay | Admitting: Sports Medicine

## 2024-01-30 ENCOUNTER — Encounter: Payer: Self-pay | Admitting: Sports Medicine

## 2024-01-30 ENCOUNTER — Ambulatory Visit (INDEPENDENT_AMBULATORY_CARE_PROVIDER_SITE_OTHER)

## 2024-01-30 DIAGNOSIS — M79672 Pain in left foot: Secondary | ICD-10-CM | POA: Diagnosis not present

## 2024-01-30 DIAGNOSIS — M7732 Calcaneal spur, left foot: Secondary | ICD-10-CM | POA: Diagnosis not present

## 2024-01-30 DIAGNOSIS — Z13820 Encounter for screening for osteoporosis: Secondary | ICD-10-CM | POA: Diagnosis not present

## 2024-01-30 DIAGNOSIS — S92512A Displaced fracture of proximal phalanx of left lesser toe(s), initial encounter for closed fracture: Secondary | ICD-10-CM | POA: Diagnosis not present

## 2024-01-30 NOTE — Progress Notes (Addendum)
    Procedures performed today:    None.  Independent interpretation of notes and tests performed by another provider:   X-rays do confirm an oblique fracture fifth proximal phalanx  Brief History, Exam, Impression, and Recommendations:    Left foot pain Very pleasant 53 year old female, she actually kicked her husband's shoe. This occurred 9 days ago, she now has swelling and pain, bruising over the dorsal forefoot, she has discrete tenderness 3rd through 5th metatarsophalangeal joints, predominantly over the distal metatarsal, she will do a postop shoe, I would like some x-rays. She declined pain medication, return in 2 weeks for serial x-rays. X-ray before visit.  Update:  X-rays do confirm an oblique fracture fifth proximal phalanx, they do appear well aligned so no change in treatment plan for now.    ____________________________________________ Debby PARAS. Curtis, M.D., ABFM., CAQSM., AME. Primary Care and Sports Medicine [facility]  Adjunct Professor of [facility] Medicine  [employer]  Restaurant manager, fast food

## 2024-01-30 NOTE — Addendum Note (Signed)
 Addended by: CURTIS DEBBY PARAS on: 01/30/2024 11:35 AM   Modules accepted: Level of Service

## 2024-01-30 NOTE — Assessment & Plan Note (Addendum)
 Very pleasant 53 year old female, she actually kicked her husband's shoe. This occurred 9 days ago, she now has swelling and pain, bruising over the dorsal forefoot, she has discrete tenderness 3rd through 5th metatarsophalangeal joints, predominantly over the distal metatarsal, she will do a postop shoe, I would like some x-rays. She declined pain medication, return in 2 weeks for serial x-rays. X-ray before visit.  Update:  X-rays do confirm an oblique fracture fifth proximal phalanx, they do appear well aligned so no change in treatment plan for now.

## 2024-01-31 ENCOUNTER — Other Ambulatory Visit (HOSPITAL_COMMUNITY): Payer: Self-pay

## 2024-02-10 ENCOUNTER — Encounter: Payer: Self-pay | Admitting: Sports Medicine

## 2024-02-10 ENCOUNTER — Ambulatory Visit (INDEPENDENT_AMBULATORY_CARE_PROVIDER_SITE_OTHER)

## 2024-02-10 ENCOUNTER — Ambulatory Visit (INDEPENDENT_AMBULATORY_CARE_PROVIDER_SITE_OTHER): Admitting: Sports Medicine

## 2024-02-10 VITALS — BP 113/77 | HR 69 | Temp 98.1°F | Ht 65.0 in | Wt 163.0 lb

## 2024-02-10 DIAGNOSIS — R739 Hyperglycemia, unspecified: Secondary | ICD-10-CM

## 2024-02-10 DIAGNOSIS — E6609 Other obesity due to excess calories: Secondary | ICD-10-CM

## 2024-02-10 DIAGNOSIS — E66811 Obesity, class 1: Secondary | ICD-10-CM

## 2024-02-10 DIAGNOSIS — Z0000 Encounter for general adult medical examination without abnormal findings: Secondary | ICD-10-CM | POA: Diagnosis not present

## 2024-02-10 DIAGNOSIS — S92512A Displaced fracture of proximal phalanx of left lesser toe(s), initial encounter for closed fracture: Secondary | ICD-10-CM | POA: Diagnosis not present

## 2024-02-10 DIAGNOSIS — M79672 Pain in left foot: Secondary | ICD-10-CM

## 2024-02-10 DIAGNOSIS — I10 Essential (primary) hypertension: Secondary | ICD-10-CM | POA: Diagnosis not present

## 2024-02-10 DIAGNOSIS — M7732 Calcaneal spur, left foot: Secondary | ICD-10-CM | POA: Diagnosis not present

## 2024-02-10 DIAGNOSIS — Z6832 Body mass index (BMI) 32.0-32.9, adult: Secondary | ICD-10-CM | POA: Diagnosis not present

## 2024-02-10 NOTE — Assessment & Plan Note (Signed)
 Fantastic weight loss, around 50 pounds, she feels a lot better with regards to her entire life. We did discuss adding resistance training.

## 2024-02-10 NOTE — Assessment & Plan Note (Signed)
 Oblique fracture fifth proximal phalanx, stable now 3 weeks postinjury. No further intervention needed.

## 2024-02-10 NOTE — Progress Notes (Signed)
 Subjective:    CC: Annual Physical Exam  HPI:  This patient is here for their annual physical  I reviewed the past medical history, family history, social history, surgical history, and allergies today and no changes were needed.  Please see the problem list section below in epic for further details.  Past Medical History: Past Medical History:  Diagnosis Date   Anxiety    Back pain    Bicuspid aortic valve    Breast cancer (HCC)    Cholelithiasis 01/2020   COVID-19 07/15/2020   Depression    Hospital discharge follow-up 02/25/2020   Hypertension    Joint pain    Kidney cyst, acquired    Obesity    Palpitations    Polycythemia vera (HCC)    PONV (postoperative nausea and vomiting)    Oct 31 2019 hernia with out problems   Right flank pain 07/25/2019   Thoracic aortic aneurysm (HCC)    Thyroid  nodule    Ventral hernia without obstruction or gangrene 09/20/2019   Formatting of this note might be different from the original. Added automatically from request for surgery [id number]   Past Surgical History: Past Surgical History:  Procedure Laterality Date   BILIARY DILATION  02/14/2020   Procedure: BILIARY DILATION;  Surgeon: Saintclair Jasper, MD;  Location: [facility];  Service: Gastroenterology;;   CHOLECYSTECTOMY     DILITATION & CURRETTAGE/HYSTROSCOPY WITH THERMACHOICE ABLATION N/A 12/28/2012   Procedure: DILATATION & CURETTAGE/HYSTEROSCOPY WITH THERMACHOICE ABLATION;  Surgeon: Rosaline LITTIE Cobble, MD;  Location: [facility];  Service: Gynecology;  Laterality: N/A;   ERCP N/A 02/14/2020   Procedure: ENDOSCOPIC RETROGRADE CHOLANGIOPANCREATOGRAPHY (ERCP);  Surgeon: Saintclair Jasper, MD;  Location: [facility];  Service: Gastroenterology;  Laterality: N/A;   HERNIA REPAIR     LAPAROSCOPIC CHOLECYSTECTOMY SINGLE PORT N/A 02/13/2020   Procedure: LAPAROSCOPIC CHOLECYSTECTOMY, CHOLANGIOGRAM, LIVER BIOPSY;  Surgeon: Sheldon Standing, MD;  Location: [facility];  Service: General;  Laterality: N/A;    MASTECTOMY     bilateral   PARTIAL NEPHRECTOMY     RT    SPHINCTEROTOMY  02/14/2020   Procedure: SPHINCTEROTOMY;  Surgeon: Saintclair Jasper, MD;  Location: [facility];  Service: Gastroenterology;;   TONSILLECTOMY     TUBAL LIGATION     Social History: Social History   Socioeconomic History   Marital status: Married    Spouse name: Don   Number of children: 3   Years of education: Not on file   Highest education level: Not on file  Occupational History   Occupation: Teacher, 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[employer]   Occupation: RN  Tobacco Use   Smoking status: Never   Smokeless tobacco: Never  Vaping Use   Vaping status: Never Used  Substance and Sexual Activity   Alcohol use: Yes    Comment: Occasional   Drug use: No   Sexual activity: Not on file  Other Topics Concern   Not on file  Social History Narrative   Not on file   Social Drivers of Health   Financial Resource Strain: Not on file  Food Insecurity: Not on file  Transportation Needs: Not on file  Physical Activity: Not on file  Stress: Not on file  Social Connections: Unknown (11/06/2021)   Received from [facility]   Social Network    Social Network: Not on file   Family History: Family History  Problem Relation Age of Onset   Cancer Mother        lung   Depression Mother  Anxiety disorder Mother    Bipolar disorder Mother    Alcoholism Mother    COPD Father    Congestive Heart Failure Father    Depression Father    Alcoholism Father    Heart disease Father    Stroke Maternal Grandfather    Colon cancer Neg Hx    Stomach cancer Neg Hx    Esophageal cancer Neg Hx    Allergies: Allergies  Allergen Reactions   Lorcaserin      Other Reaction(s): headache/confusion/dizziness   Vilazodone Hcl     Other Reaction(s): skin crawling   Medications: See med rec.  Review of Systems: No headache, visual changes, nausea, vomiting, diarrhea, constipation, dizziness, abdominal pain, skin rash, fevers, chills, night  sweats, swollen lymph nodes, weight loss, chest pain, body aches, joint swelling, muscle aches, shortness of breath, mood changes, visual or auditory hallucinations.  Objective:    General: Well Developed, well nourished, and in no acute distress.  Neuro: Alert and oriented x3, extra-ocular muscles intact, sensation grossly intact. Cranial nerves II through XII are intact, motor, sensory, and coordinative functions are all intact. HEENT: Normocephalic, atraumatic, pupils equal round reactive to light, neck supple, no masses, no lymphadenopathy, thyroid  nonpalpable. Oropharynx, nasopharynx, external ear canals are unremarkable. Skin: Warm and dry, no rashes noted.  Cardiac: Regular rate and rhythm, no murmurs rubs or gallops.  Respiratory: Clear to auscultation bilaterally. Not using accessory muscles, speaking in full sentences.  Abdominal: Soft, nontender, nondistended, positive bowel sounds, no masses, no organomegaly.  Musculoskeletal: Shoulder, elbow, wrist, hip, knee, ankle stable, and with full range of motion.  Impression and Recommendations:    The patient was counselled, risk factors were discussed, anticipatory guidance given.  Annual physical exam Fasting annual physical as above. Up-to-date on [facility] cancer screening[facility] Postmastectomy with reconstruction so no mammograms needed. Return to see me in a year.  Left foot pain Oblique fracture fifth proximal phalanx, stable now 3 weeks postinjury. No further intervention needed.  Class 1 obesity with serious comorbidity and body mass index (BMI) of 32.0 to 32.9 in adult Fantastic weight loss, around 50 pounds, she feels a lot better with regards to her entire life. We did discuss adding resistance training.    ____________________________________________ Debby PARAS. Curtis, M.D., ABFM., CAQSM., AME. Primary Care and Sports Medicine  [facility]  Adjunct  Professor of [facility] Medicine  [employer]  Restaurant manager, fast food

## 2024-02-10 NOTE — Assessment & Plan Note (Signed)
 Fasting annual physical as above. Up-to-date on cervical cancer screening, done in 2024 [facility]. Postmastectomy with reconstruction so no mammograms needed. Return to see me in a year.

## 2024-02-11 ENCOUNTER — Ambulatory Visit: Payer: Self-pay | Admitting: Sports Medicine

## 2024-02-11 LAB — COMPREHENSIVE METABOLIC PANEL WITH GFR
ALT: 15 IU/L (ref 0–32)
AST: 18 IU/L (ref 0–40)
Albumin: 4.1 g/dL (ref 3.8–4.9)
Alkaline Phosphatase: 97 IU/L (ref 44–121)
BUN/Creatinine Ratio: 16 (ref 9–23)
BUN: 10 mg/dL (ref 6–24)
Bilirubin Total: 0.3 mg/dL (ref 0.0–1.2)
CO2: 24 mmol/L (ref 20–29)
Calcium: 8.9 mg/dL (ref 8.7–10.2)
Chloride: 104 mmol/L (ref 96–106)
Creatinine, Ser: 0.61 mg/dL (ref 0.57–1.00)
Globulin, Total: 2.3 g/dL (ref 1.5–4.5)
Glucose: 71 mg/dL (ref 70–99)
Potassium: 4.4 mmol/L (ref 3.5–5.2)
Sodium: 141 mmol/L (ref 134–144)
Total Protein: 6.4 g/dL (ref 6.0–8.5)
eGFR: 106 mL/min/1.73 (ref >59)

## 2024-02-11 LAB — LIPID PANEL
Chol/HDL Ratio: 2.3 ratio (ref 0.0–4.4)
Cholesterol, Total: 218 mg/dL — ABNORMAL HIGH (ref 100–199)
HDL: 94 mg/dL (ref >39)
LDL Chol Calc (NIH): 109 mg/dL — ABNORMAL HIGH (ref 0–99)
Triglycerides: 86 mg/dL (ref 0–149)
VLDL Cholesterol Cal: 15 mg/dL (ref 5–40)

## 2024-02-11 LAB — CBC
Hematocrit: 40.4 % (ref 34.0–46.6)
Hemoglobin: 13.1 g/dL (ref 11.1–15.9)
MCH: 29.2 pg (ref 26.6–33.0)
MCHC: 32.4 g/dL (ref 31.5–35.7)
MCV: 90 fL (ref 79–97)
Platelets: 141 x10E3/uL — ABNORMAL LOW (ref 150–450)
RBC: 4.49 x10E6/uL (ref 3.77–5.28)
RDW: 13 % (ref 11.7–15.4)
WBC: 5.5 x10E3/uL (ref 3.4–10.8)

## 2024-02-11 LAB — HEMOGLOBIN A1C
Est. average glucose Bld gHb Est-mCnc: 94 mg/dL
Hgb A1c MFr Bld: 4.9 % (ref 4.8–5.6)

## 2024-02-11 LAB — TSH: TSH: 0.733 u[IU]/mL (ref 0.450–4.500)

## 2024-02-28 ENCOUNTER — Encounter: Payer: Self-pay | Admitting: Sports Medicine

## 2024-03-02 ENCOUNTER — Telehealth: Payer: Self-pay | Admitting: Medical-Surgical

## 2024-03-02 NOTE — Telephone Encounter (Signed)
 Please contact patient to schedule for a TOC appointment from Dr. ONEIDA to me. She reached out via email to request the switch and I have approved it.  Thanks [employer]

## 2024-03-11 ENCOUNTER — Other Ambulatory Visit: Payer: Self-pay

## 2024-03-13 ENCOUNTER — Encounter (HOSPITAL_COMMUNITY): Payer: Self-pay

## 2024-03-13 ENCOUNTER — Other Ambulatory Visit: Payer: Self-pay

## 2024-03-13 ENCOUNTER — Other Ambulatory Visit: Payer: Self-pay | Admitting: Medical-Surgical

## 2024-03-13 ENCOUNTER — Other Ambulatory Visit (HOSPITAL_COMMUNITY): Payer: Self-pay

## 2024-03-13 DIAGNOSIS — Z6832 Body mass index (BMI) 32.0-32.9, adult: Secondary | ICD-10-CM

## 2024-03-13 MED ORDER — AMPHETAMINE-DEXTROAMPHET ER 15 MG PO CP24
15.0000 mg | ORAL_CAPSULE | Freq: Every day | ORAL | 0 refills | Status: DC
  Filled 2024-04-16: qty 30, 30d supply, fill #0

## 2024-03-13 MED ORDER — AMPHETAMINE-DEXTROAMPHET ER 15 MG PO CP24
15.0000 mg | ORAL_CAPSULE | Freq: Every day | ORAL | 0 refills | Status: AC
  Filled 2024-05-16: qty 30, 30d supply, fill #0

## 2024-03-13 MED ORDER — AMPHETAMINE-DEXTROAMPHET ER 15 MG PO CP24
15.0000 mg | ORAL_CAPSULE | Freq: Every day | ORAL | 0 refills | Status: DC
  Filled 2024-03-13: qty 30, 30d supply, fill #0

## 2024-03-13 NOTE — Telephone Encounter (Signed)
 Copied from CRM [id number]. Topic: Clinical - Medication Refill >> Mar 13, 2024  1:57 PM Alfonso ORN wrote: Medication: amphetamine -dextroamphetamine  (ADDERALL XR) 15 MG 24 hr capsule  Has the patient contacted their pharmacy? Yes (Agent: If no, request that the patient contact the pharmacy for the refill. If patient does not wish to contact the pharmacy document the reason why and proceed with request.) (Agent: If yes, when and what did the pharmacy advise?)  This is the patient's preferred pharmacy:  [facility] [address], [address] [place] [address] Phone: [phone] Fax: [phone]  Is this the correct pharmacy for this prescription? Yes If no, delete pharmacy and type the correct one.   Has the prescription been filled recently? No  Is the patient out of the medication? Yes  Has the patient been seen for an appointment in the last year OR does the patient have an upcoming appointment? No  Can we respond through MyChart? Yes  Agent: Please be advised that Rx refills may take up to 3 business days. We ask that you follow-up with your pharmacy.

## 2024-03-14 ENCOUNTER — Other Ambulatory Visit (HOSPITAL_COMMUNITY): Payer: Self-pay

## 2024-04-16 ENCOUNTER — Other Ambulatory Visit (HOSPITAL_COMMUNITY): Payer: Self-pay

## 2024-04-18 ENCOUNTER — Other Ambulatory Visit

## 2024-05-16 ENCOUNTER — Other Ambulatory Visit (HOSPITAL_COMMUNITY): Payer: Self-pay

## 2024-06-05 ENCOUNTER — Other Ambulatory Visit: Payer: Self-pay

## 2024-06-10 ENCOUNTER — Other Ambulatory Visit: Payer: Self-pay | Admitting: Medical-Surgical

## 2024-06-10 ENCOUNTER — Encounter (HOSPITAL_COMMUNITY): Payer: Self-pay

## 2024-06-10 ENCOUNTER — Other Ambulatory Visit (HOSPITAL_COMMUNITY): Payer: Self-pay

## 2024-06-11 ENCOUNTER — Other Ambulatory Visit (HOSPITAL_COMMUNITY): Payer: Self-pay

## 2024-06-11 ENCOUNTER — Other Ambulatory Visit: Payer: Self-pay

## 2024-06-11 MED ORDER — HYDROXYZINE HCL 50 MG PO TABS
50.0000 mg | ORAL_TABLET | Freq: Every evening | ORAL | 0 refills | Status: DC | PRN
  Filled 2024-06-11: qty 30, 30d supply, fill #0

## 2024-06-18 ENCOUNTER — Other Ambulatory Visit: Payer: Self-pay | Admitting: Medical-Surgical

## 2024-06-18 ENCOUNTER — Other Ambulatory Visit (HOSPITAL_COMMUNITY): Payer: Self-pay

## 2024-06-18 DIAGNOSIS — E66811 Obesity, class 1: Secondary | ICD-10-CM

## 2024-06-18 MED ORDER — AMPHETAMINE-DEXTROAMPHET ER 15 MG PO CP24
15.0000 mg | ORAL_CAPSULE | Freq: Every day | ORAL | 0 refills | Status: AC
  Filled 2024-06-18: qty 30, 30d supply, fill #0

## 2024-06-18 NOTE — Telephone Encounter (Signed)
 Last Filled 05/12/2024  Last OV 07/22/2021  Upcoming appointment 02/13/2025

## 2024-06-19 ENCOUNTER — Other Ambulatory Visit (HOSPITAL_COMMUNITY): Payer: Self-pay

## 2024-06-29 ENCOUNTER — Other Ambulatory Visit (HOSPITAL_COMMUNITY): Payer: Self-pay

## 2024-07-11 ENCOUNTER — Encounter: Payer: Self-pay | Admitting: Physician Assistant

## 2024-07-11 ENCOUNTER — Other Ambulatory Visit (HOSPITAL_COMMUNITY): Payer: Self-pay

## 2024-07-11 ENCOUNTER — Ambulatory Visit

## 2024-07-11 ENCOUNTER — Ambulatory Visit: Admitting: Physician Assistant

## 2024-07-11 VITALS — BP 128/85 | HR 95 | Ht 65.0 in | Wt 165.0 lb

## 2024-07-11 DIAGNOSIS — M5412 Radiculopathy, cervical region: Secondary | ICD-10-CM | POA: Diagnosis not present

## 2024-07-11 MED ORDER — CYCLOBENZAPRINE HCL 10 MG PO TABS
10.0000 mg | ORAL_TABLET | Freq: Three times a day (TID) | ORAL | 0 refills | Status: AC | PRN
  Filled 2024-07-11: qty 30, 10d supply, fill #0

## 2024-07-11 MED ORDER — PREDNISONE 50 MG PO TABS
50.0000 mg | ORAL_TABLET | Freq: Every day | ORAL | 0 refills | Status: DC
  Filled 2024-07-11: qty 5, 5d supply, fill #0

## 2024-07-11 NOTE — Patient Instructions (Signed)
 Start prednisone  for 5 days. Flexeril  as needed.  Icy hot patches and consider massages.  Xray downstairs today of neck.    Cervical Radiculopathy  Cervical radiculopathy happens when a nerve in the neck (a cervical nerve) is pinched or bruised. This condition can happen because of an injury to the cervical spine (vertebrae) in the neck, or as part of the normal aging process. Pressure on the cervical nerves can cause pain or numbness that travels from the neck all the way down to the arm and fingers. This condition usually gets better with rest. Treatment may be needed if the condition does not improve. What are the causes? This condition may be caused by: A neck injury. A bulging (herniated) disk. Muscle spasms. Muscle tightness in the neck due to overuse. Arthritis. Breakdown or degeneration in the bones and joints of the spine (spondylosis) due to aging. Bone spurs that may develop near the cervical nerves. What are the signs or symptoms? Symptoms of this condition include: Pain. The pain may travel from the neck to the arm and hand. The pain can be severe or irritating. It may get worse when you move your neck. Numbness or tingling in your arm or hand. Weakness in the affected arm and hand, in severe cases. How is this diagnosed? This condition may be diagnosed based on your symptoms, your medical history, and a physical exam. You may also have tests, including: X-rays. CT scan. MRI. Electromyogram (EMG). Nerve conduction tests. How is this treated? In many cases, treatment is not needed for this condition. With rest, the condition usually gets better over time. If treatment is needed, options may include: Wearing a soft neck collar (cervical collar) for short periods of time. Doing physical therapy to strengthen your neck muscles. Taking medicines. These may include NSAIDs, such as ibuprofen , or oral corticosteroids. Having spinal injections, in severe cases. Having surgery.  This may be needed if other treatments do not help. Different types of surgery may be done depending on the cause of this condition. Follow these instructions at home: If you have a cervical collar: Wear it as told by your health care provider. Remove it only as told by your health care provider. Ask your health care provider if you can remove the cervical collar for cleaning and bathing. If you are allowed to remove the collar for cleaning or bathing: Follow instructions from your health care provider about how to remove the collar safely. Clean the collar by wiping it with mild soap and water  and drying it completely. Take out any removable pads in the collar every 1-2 days, and wash them by hand with soap and water . Let them air-dry completely before you put them back in the collar. Check your skin under the collar for irritation or sores. If you see any, tell your health care provider. Managing pain     Take over-the-counter and prescription medicines only as told by your health care provider. If directed, put ice on the affected area. To do this: If you have a soft neck collar, remove it as told by your health care provider. Put ice in a plastic bag. Place a towel between your skin and the bag. Leave the ice on for 20 minutes, 2-3 times a day. Remove the ice if your skin turns bright red. This is very important. If you cannot feel pain, heat, or cold, you have a greater risk of damage to the area. If applying ice does not help, you can try using heat.  Use the heat source that your health care provider recommends, such as a moist heat pack or a heating pad. Place a towel between your skin and the heat source. Leave the heat on for 20-30 minutes. Remove the heat if your skin turns bright red. This is especially important if you are unable to feel pain, heat, or cold. You have a greater risk of getting burned. Try a gentle neck and shoulder massage to help relieve symptoms. Activity Rest  as needed. Return to your normal activities as told by your health care provider. Ask your health care provider what activities are safe for you. Do stretching and strengthening exercises as told by your health care provider or your physical therapist. You may have to avoid lifting. Ask your health care provider how much you can safely lift. General instructions Use a flat pillow when you sleep. Do not drive while wearing a cervical collar. If you do not have a cervical collar, ask your health care provider if it is safe to drive while your neck heals. Ask your health care provider if the medicine prescribed to you requires you to avoid driving or using machinery. Do not use any products that contain nicotine or tobacco. These products include cigarettes, chewing tobacco, and vaping devices, such as e-cigarettes. If you need help quitting, ask your health care provider. Keep all follow-up visits. This is important. Contact a health care provider if: Your condition does not improve with treatment. Get help right away if: Your pain gets much worse and is not controlled with medicines. You have weakness or numbness in your hand, arm, face, or leg. You have a high fever. You have a stiff, rigid neck. You lose control of your bowels or your bladder (have incontinence). You have trouble with walking, balance, or speaking. Summary Cervical radiculopathy happens when a nerve in the neck is pinched or bruised. A nerve can get pinched from a bulging disk, arthritis, muscle spasms, or an injury to the neck. Symptoms include pain, tingling, or numbness radiating from the neck to the arm or hand. Weakness can also occur in severe cases. Treatment may include rest, wearing a cervical collar, and physical therapy. Medicines may be prescribed to help with pain. In severe cases, injections or surgery may be needed. This information is not intended to replace advice given to you by your health care provider.  Make sure you discuss any questions you have with your health care provider. Document Revised: 12/18/2020 Document Reviewed: 12/18/2020 Elsevier Patient Education  2024 [employer].

## 2024-07-11 NOTE — Progress Notes (Signed)
 "  Acute Office Visit  Subjective:     Patient ID: Melissa Atkinson, female    DOB: [birth year], 54 y.o.   MRN: [id number]  Chief Complaint  Patient presents with   Neck Pain   Back Pain    HPI .SABRADiscussed the use of AI scribe software for clinical note transcription with the patient, who gave verbal consent to proceed.  History of Present Illness Melissa Atkinson is a 54 year old female who presents with right-sided neck pain and numbness in the right thumb.  Cervicalgia (neck pain) - Right-sided neck pain began after sleeping in an awkward position before Christmas - Pain onset coincided with increased holiday activities, including making pepperoni bread - Pain quality described as a 'toothache' in the muscle, particularly in the scapula area, and as feeling like an 'egg' - No history of trauma to the area - Pain does not worsen with touch - No shoulder pain - No changes in strength - No specific positions consistently alleviate or exacerbate symptoms; comfort varies with standing, sitting, or lying on either side  Paresthesia (numbness) - Numbness present in the right thumb - Numbness extends down the back of the right arm  Analgesic use and response - Tylenol  extra strength and ibuprofen  provide noticeable relief of pain - Current medication regimen includes Tylenol  extra strength, which is effective in managing pain  Dietary and lifestyle factors - Increased consumption of cookies, alcohol, and diet Coke during the holidays - Pain persisted despite returning to a healthier diet    ROS See HPI.     Objective:    BP 128/85   Pulse 95   Ht 5' 5 (1.651 m)   Wt 165 lb (74.8 kg)   SpO2 100%   BMI 27.46 kg/m  BP Readings from Last 3 Encounters:  07/11/24 128/85  02/10/24 113/77  01/16/24 125/74   Wt Readings from Last 3 Encounters:  07/11/24 165 lb (74.8 kg)  02/10/24 163 lb 0.6 oz (74 kg)  01/16/24 163 lb 8 oz (74.2 kg)      Physical Exam Constitutional:       Appearance: Normal appearance.  HENT:     Head: Normocephalic.  Cardiovascular:     Rate and Rhythm: Normal rate and regular rhythm.  Pulmonary:     Effort: Pulmonary effort is normal.     Breath sounds: Normal breath sounds.  Musculoskeletal:     Comments: Right shoulder: NROM of right shoulder, Strength 5/5, hand grip 5/5, negative for flinklestein, tinels, phalens, no tenderness over cervical spine, tenderness paraspinal muscles to the right and into the right upper back.   Neurological:     Mental Status: She is alert.          Assessment & Plan:  .SABRASam was seen today for neck pain and back pain.  Diagnoses and all orders for this visit:  Cervical radiculitis -     DG Cervical Spine Complete; Future -     cyclobenzaprine  (FLEXERIL ) 10 MG tablet; Take 1 tablet (10 mg total) by mouth 3 (three) times daily as needed for muscle spasms. -     predniSONE  (DELTASONE ) 50 MG tablet; Take 1 tablet (50 mg total) by mouth daily for 5 days.   Assessment & Plan Cervical radiculopathy/radiculitis Right thumb and arm numbness, likely C6 nerve root involvement. Symptoms intermittent, relieved by Tylenol . No strength changes. - Ordered cervical spine x-ray. - Prescribed prednisone  burst. - Prescribed nighttime muscle relaxers. - Recommended cervical exercises and encouraged  massage therapy.  - Advised icy hot patch with lidocaine . - Discussed dry needling if symptoms persist. - Instructed to call if symptoms worsen or persist after 2 weeks.      Return if symptoms worsen or fail to improve.  Vera Wishart, PA-C   "

## 2024-07-12 ENCOUNTER — Other Ambulatory Visit: Payer: Self-pay

## 2024-07-12 ENCOUNTER — Ambulatory Visit: Admitting: Medical-Surgical

## 2024-07-12 ENCOUNTER — Other Ambulatory Visit: Payer: Self-pay | Admitting: Cardiology

## 2024-07-12 ENCOUNTER — Other Ambulatory Visit (HOSPITAL_COMMUNITY): Payer: Self-pay

## 2024-07-12 ENCOUNTER — Other Ambulatory Visit: Payer: Self-pay | Admitting: Medical-Surgical

## 2024-07-12 DIAGNOSIS — I10 Essential (primary) hypertension: Secondary | ICD-10-CM

## 2024-07-12 MED ORDER — HYDROXYZINE HCL 50 MG PO TABS
50.0000 mg | ORAL_TABLET | Freq: Every evening | ORAL | 0 refills | Status: AC | PRN
  Filled 2024-07-12: qty 30, 30d supply, fill #0

## 2024-07-13 ENCOUNTER — Encounter: Payer: Self-pay | Admitting: Physician Assistant

## 2024-07-13 ENCOUNTER — Other Ambulatory Visit (HOSPITAL_COMMUNITY): Payer: Self-pay

## 2024-07-13 MED ORDER — LOSARTAN POTASSIUM 25 MG PO TABS
25.0000 mg | ORAL_TABLET | Freq: Every day | ORAL | 0 refills | Status: AC
  Filled 2024-07-13: qty 90, 90d supply, fill #0

## 2024-07-16 ENCOUNTER — Other Ambulatory Visit (HOSPITAL_COMMUNITY): Payer: Self-pay

## 2024-07-17 ENCOUNTER — Other Ambulatory Visit (HOSPITAL_COMMUNITY): Payer: Self-pay

## 2024-07-18 ENCOUNTER — Ambulatory Visit: Payer: Self-pay | Admitting: Physician Assistant

## 2024-07-18 DIAGNOSIS — M5030 Other cervical disc degeneration, unspecified cervical region: Secondary | ICD-10-CM | POA: Insufficient documentation

## 2024-07-18 DIAGNOSIS — M5412 Radiculopathy, cervical region: Secondary | ICD-10-CM

## 2024-07-18 NOTE — Progress Notes (Signed)
 Melissa Atkinson,   C4,5,6 has disc space narrowing and bone spur formation all which could be leading to symptoms. How are you feeling after appt?

## 2024-07-19 ENCOUNTER — Other Ambulatory Visit: Payer: Self-pay | Admitting: Medical-Surgical

## 2024-07-19 DIAGNOSIS — E6609 Other obesity due to excess calories: Secondary | ICD-10-CM

## 2024-07-20 ENCOUNTER — Other Ambulatory Visit (HOSPITAL_COMMUNITY): Payer: Self-pay

## 2024-07-20 ENCOUNTER — Encounter (HOSPITAL_COMMUNITY): Payer: Self-pay

## 2024-07-20 ENCOUNTER — Ambulatory Visit: Admitting: Medical-Surgical

## 2024-07-20 DIAGNOSIS — M5412 Radiculopathy, cervical region: Secondary | ICD-10-CM | POA: Insufficient documentation

## 2024-07-20 MED ORDER — AMPHETAMINE-DEXTROAMPHET ER 15 MG PO CP24
15.0000 mg | ORAL_CAPSULE | Freq: Every day | ORAL | 0 refills | Status: AC
  Filled 2024-07-20: qty 30, 30d supply, fill #0

## 2024-07-20 MED ORDER — GABAPENTIN 100 MG PO CAPS
100.0000 mg | ORAL_CAPSULE | Freq: Three times a day (TID) | ORAL | 1 refills | Status: AC
  Filled 2024-07-20: qty 90, 30d supply, fill #0

## 2024-07-20 NOTE — Telephone Encounter (Signed)
 Patient continues to have pain down right arm.  MRI ordered.  PT placed.  Referral to Sports Medicine Placed.  Start gabapentin 

## 2024-07-23 ENCOUNTER — Other Ambulatory Visit (HOSPITAL_COMMUNITY): Payer: Self-pay

## 2024-07-25 ENCOUNTER — Ambulatory Visit: Admitting: Physician Assistant

## 2024-07-25 ENCOUNTER — Ambulatory Visit

## 2024-07-25 VITALS — BP 130/88 | HR 84 | Ht 65.0 in | Wt 169.8 lb

## 2024-07-25 DIAGNOSIS — M5412 Radiculopathy, cervical region: Secondary | ICD-10-CM | POA: Diagnosis not present

## 2024-07-25 DIAGNOSIS — M25511 Pain in right shoulder: Secondary | ICD-10-CM

## 2024-07-25 DIAGNOSIS — M5030 Other cervical disc degeneration, unspecified cervical region: Secondary | ICD-10-CM

## 2024-07-25 NOTE — Progress Notes (Signed)
 "  Established Patient Office Visit  Subjective   Patient ID: Melissa Atkinson, female    DOB: 1970-12-25  Age: 54 y.o. MRN: [id number]  Chief Complaint  Patient presents with   Shoulder Pain    Patient stated her Rt shoulder has pain since 1 week     HPI .Discussed the use of AI scribe software for clinical note transcription with the patient, who gave verbal consent to proceed.  History of Present Illness Melissa Atkinson is a 54 year old female who presents with right shoulder and arm pain and neck pain.   Right shoulder and arm pain - Persistent pain localized to the right shoulder and arm - Pain is localized to the front of the shoulder, very tender to palpation - No pain in the back of the shoulder - No recent shoulder or neck injury - No significant weakness that prevents performance of daily activities  Mechanical symptoms - Clavicle clicking and popping - No recent trauma or injury to the shoulder  Paresthesia and sensory changes - Progressive tingling sensation, initially in the thumb, now involving the entire arm - Sensation described as the arm 'falling asleep' with tingling and coldness, though the arm feels warm to touch - Tingling relieved by hyperflexion of the neck - No tingling in the back of the shoulder  Weakness - Weakness in the right arm, especially when holding objects such as a coffee cup  Prior treatments and response - Gabapentin  provides some relief but causes drowsiness, limiting use to evenings - Previous use of muscle relaxers, Zanaflex, and prednisone  without significant improvement - Daily use of Tylenol  and ibuprofen  for pain management  Imaging and workup - Cervical spine imaging shows mild degeneration at C4, C5, C6 with disc narrowing and osteophyte formation  Physical therapy and surgical considerations - Physical therapy not yet initiated due to scheduling delays - Hesitant about surgical interventions    ROS See HPI,     Objective:     BP 130/88 (Cuff Size: Normal)   Pulse 84   Ht 5' 5 (1.651 m)   Wt 169 lb 12 oz (77 kg)   SpO2 99%   BMI 28.25 kg/m  BP Readings from Last 3 Encounters:  07/25/24 130/88  07/11/24 128/85  02/10/24 113/77   Wt Readings from Last 3 Encounters:  07/25/24 169 lb 12 oz (77 kg)  07/11/24 165 lb (74.8 kg)  02/10/24 163 lb 0.6 oz (74 kg)      Physical Exam Cardiovascular:     Rate and Rhythm: Normal rate and regular rhythm.  Pulmonary:     Effort: Pulmonary effort is normal.     Breath sounds: Normal breath sounds.  Musculoskeletal:     Comments: Right shoulder: no pain to palpation over clavicle or AC joint. No swelling or redness. Pain with abduction but localized to upper right shoulder and into neck. 180 degrees but with pain. Negative Yergason. Strength 5/5 upper extremities.   Neurological:     General: No focal deficit present.     Mental Status: She is alert and oriented to person, place, and time.  Psychiatric:        Mood and Affect: Mood normal.        The 10-year ASCVD risk score (Arnett DK, et al., 2019) is: 1.8%    Assessment & Plan:  .Melissa Atkinson was seen today for shoulder pain.  Diagnoses and all orders for this visit:  Acute pain of right shoulder -  DG Shoulder Right; Future  Cervical radiculopathy  DDD (degenerative disc disease), cervical   Assessment & Plan Cervical spondylosis with radiculopathy Mild degeneration at C4-C5 and C5-C6 with disc space narrowing and osteophyte formation. Symptoms suggest nerve involvement, likely affecting C5-C6 roots. She preferred conservative management over surgery. - Ordered cervical spine MRI to assess nerve involvement. - Continue physical therapy starting on the 12. - Consider increasing gabapentin  dosage at night for nerve pain management. - Avoid tramadol  and other strong analgesics as per her preference.  Right shoulder pain New onset right shoulder pain with clavicle clicking and  popping. Differential includes cervical radiculopathy or musculoskeletal issues. She preferred conservative management over surgery. - Ordered right shoulder x-ray to evaluate for bony abnormalities. - Referred to sports medicine specialist for further evaluation. - Continue physical therapy as scheduled.     Melissa Grieder, PA-C  "

## 2024-07-30 ENCOUNTER — Ambulatory Visit: Payer: Self-pay | Admitting: Physician Assistant

## 2024-07-30 NOTE — Progress Notes (Signed)
 Melissa Atkinson,   No arthritis or abnormal findings of right shoulder.normal alignment.  More arthritis seen in thoracic spine.

## 2024-08-06 ENCOUNTER — Ambulatory Visit (HOSPITAL_COMMUNITY)

## 2024-08-09 ENCOUNTER — Ambulatory Visit

## 2024-08-15 ENCOUNTER — Ambulatory Visit: Admitting: Cardiology

## 2025-02-13 ENCOUNTER — Encounter: Admitting: Sports Medicine

## 2025-02-13 ENCOUNTER — Encounter: Admitting: Medical-Surgical
